# Patient Record
Sex: Female | Born: 1995 | Race: White | Hispanic: No | Marital: Single | Smoking: Never smoker
Health system: Southern US, Community
[De-identification: ages and names within clinical notes are randomized; demographics above are authoritative.]

## PROBLEM LIST (undated history)

## (undated) DIAGNOSIS — K589 Irritable bowel syndrome without diarrhea: Secondary | ICD-10-CM

## (undated) DIAGNOSIS — F329 Major depressive disorder, single episode, unspecified: Secondary | ICD-10-CM

## (undated) DIAGNOSIS — R51 Headache: Secondary | ICD-10-CM

## (undated) DIAGNOSIS — K319 Disease of stomach and duodenum, unspecified: Secondary | ICD-10-CM

## (undated) DIAGNOSIS — R519 Headache, unspecified: Secondary | ICD-10-CM

## (undated) DIAGNOSIS — K2970 Gastritis, unspecified, without bleeding: Secondary | ICD-10-CM

## (undated) DIAGNOSIS — F32A Depression, unspecified: Secondary | ICD-10-CM

## (undated) DIAGNOSIS — E669 Obesity, unspecified: Secondary | ICD-10-CM

## (undated) DIAGNOSIS — J45909 Unspecified asthma, uncomplicated: Secondary | ICD-10-CM

## (undated) DIAGNOSIS — A0472 Enterocolitis due to Clostridium difficile, not specified as recurrent: Secondary | ICD-10-CM

## (undated) DIAGNOSIS — F419 Anxiety disorder, unspecified: Secondary | ICD-10-CM

## (undated) DIAGNOSIS — E559 Vitamin D deficiency, unspecified: Secondary | ICD-10-CM

## (undated) HISTORY — DX: Vitamin D deficiency, unspecified: E55.9

## (undated) HISTORY — DX: Headache, unspecified: R51.9

## (undated) HISTORY — DX: Obesity, unspecified: E66.9

## (undated) HISTORY — DX: Depression, unspecified: F32.A

## (undated) HISTORY — DX: Headache: R51

## (undated) HISTORY — PX: ADENOIDECTOMY: SHX5191

## (undated) HISTORY — DX: Disease of stomach and duodenum, unspecified: K31.9

## (undated) HISTORY — PX: INTRAUTERINE DEVICE INSERTION: SHX323

## (undated) HISTORY — DX: Irritable bowel syndrome, unspecified: K58.9

## (undated) HISTORY — PX: ADENOIDECTOMY: SUR15

## (undated) HISTORY — DX: Major depressive disorder, single episode, unspecified: F32.9

## (undated) HISTORY — DX: Gastritis, unspecified, without bleeding: K29.70

## (undated) HISTORY — DX: Enterocolitis due to Clostridium difficile, not specified as recurrent: A04.72

## (undated) HISTORY — DX: Unspecified asthma, uncomplicated: J45.909

## (undated) HISTORY — DX: Anxiety disorder, unspecified: F41.9

---
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
## 2013-01-22 ENCOUNTER — Other Ambulatory Visit: Payer: Self-pay | Admitting: Internal Medicine

## 2013-01-22 DIAGNOSIS — R1011 Right upper quadrant pain: Secondary | ICD-10-CM

## 2013-01-30 ENCOUNTER — Ambulatory Visit
Admission: RE | Admit: 2013-01-30 | Discharge: 2013-01-30 | Disposition: A | Discharge status: Home / Self Care | Payer: BC Managed Care – PPO | Source: Ambulatory Visit | Attending: Internal Medicine | Admitting: Internal Medicine

## 2013-01-30 DIAGNOSIS — R1011 Right upper quadrant pain: Secondary | ICD-10-CM

## 2013-02-04 ENCOUNTER — Other Ambulatory Visit (HOSPITAL_COMMUNITY): Payer: Self-pay | Admitting: Internal Medicine

## 2013-02-04 DIAGNOSIS — R109 Unspecified abdominal pain: Secondary | ICD-10-CM

## 2013-02-13 ENCOUNTER — Encounter (HOSPITAL_COMMUNITY)
Admission: RE | Admit: 2013-02-13 | Discharge: 2013-02-13 | Disposition: A | Discharge status: Home / Self Care | Payer: BC Managed Care – PPO | Source: Ambulatory Visit | Attending: Internal Medicine | Admitting: Internal Medicine

## 2013-02-13 DIAGNOSIS — R109 Unspecified abdominal pain: Secondary | ICD-10-CM

## 2013-02-13 DIAGNOSIS — R1013 Epigastric pain: Secondary | ICD-10-CM | POA: Insufficient documentation

## 2013-02-13 MED ORDER — TECHNETIUM TC 99M MEBROFENIN IV KIT
5.0000 | PACK | Freq: Once | INTRAVENOUS | Status: AC | PRN
  Administered 2013-02-13: 5 via INTRAVENOUS

## 2013-02-13 MED ORDER — SINCALIDE 5 MCG IJ SOLR
0.0200 ug/kg | Freq: Once | INTRAMUSCULAR | Status: AC
  Administered 2013-02-13: 09:00:00 via INTRAVENOUS

## 2013-07-29 ENCOUNTER — Encounter: Payer: Self-pay | Admitting: Physician Assistant

## 2013-07-29 ENCOUNTER — Ambulatory Visit (INDEPENDENT_AMBULATORY_CARE_PROVIDER_SITE_OTHER): Payer: BC Managed Care – PPO | Admitting: Physician Assistant

## 2013-07-29 VITALS — BP 110/60 | HR 76 | Temp 97.5°F | Resp 16 | Ht 62.5 in | Wt 183.0 lb

## 2013-07-29 DIAGNOSIS — J0100 Acute maxillary sinusitis, unspecified: Secondary | ICD-10-CM

## 2013-07-29 MED ORDER — MAGIC MOUTHWASH W/LIDOCAINE: 5.0000 mL | Freq: Three times a day (TID) | ORAL | Status: DC | PRN

## 2013-07-29 MED ORDER — AZITHROMYCIN 250 MG PO TABS: ORAL_TABLET | ORAL | Status: DC

## 2013-07-29 NOTE — Patient Instructions (Signed)
Pharyngitis °Pharyngitis is redness, pain, and swelling (inflammation) of your pharynx.  °CAUSES  °Pharyngitis is usually caused by infection. Most of the time, these infections are from viruses (viral) and are part of a cold. However, sometimes pharyngitis is caused by bacteria (bacterial). Pharyngitis can also be caused by allergies. Viral pharyngitis may be spread from person to person by coughing, sneezing, and personal items or utensils (cups, forks, spoons, toothbrushes). Bacterial pharyngitis may be spread from person to person by more intimate contact, such as kissing.  °SIGNS AND SYMPTOMS  °Symptoms of pharyngitis include:   °· Sore throat.   °· Tiredness (fatigue).   °· Low-grade fever.   °· Headache. °· Joint pain and muscle aches. °· Skin rashes. °· Swollen lymph nodes. °· Plaque-like film on throat or tonsils (often seen with bacterial pharyngitis). °DIAGNOSIS  °Your health care provider will ask you questions about your illness and your symptoms. Your medical history, along with a physical exam, is often all that is needed to diagnose pharyngitis. Sometimes, a rapid strep test is done. Other lab tests may also be done, depending on the suspected cause.  °TREATMENT  °Viral pharyngitis will usually get better in 3 4 days without the use of medicine. Bacterial pharyngitis is treated with medicines that kill germs (antibiotics).  °HOME CARE INSTRUCTIONS  °· Drink enough water and fluids to keep your urine clear or pale yellow.   °· Only take over-the-counter or prescription medicines as directed by your health care provider:   °· If you are prescribed antibiotics, make sure you finish them even if you start to feel better.   °· Do not take aspirin.   °· Get lots of rest.   °· Gargle with 8 oz of salt water (½ tsp of salt per 1 qt of water) as often as every 1 2 hours to soothe your throat.   °· Throat lozenges (if you are not at risk for choking) or sprays may be used to soothe your throat. °SEEK MEDICAL  CARE IF:  °· You have large, tender lumps in your neck. °· You have a rash. °· You cough up green, yellow-brown, or bloody spit. °SEEK IMMEDIATE MEDICAL CARE IF:  °· Your neck becomes stiff. °· You drool or are unable to swallow liquids. °· You vomit or are unable to keep medicines or liquids down. °· You have severe pain that does not go away with the use of recommended medicines. °· You have trouble breathing (not caused by a stuffy nose). °MAKE SURE YOU:  °· Understand these instructions. °· Will watch your condition. °· Will get help right away if you are not doing well or get worse. °Document Released: 06/06/2005 Document Revised: 03/27/2013 Document Reviewed: 02/11/2013 °ExitCare® Patient Information ©2014 [employer]. ° °

## 2013-07-29 NOTE — Progress Notes (Signed)
   Subjective:    Patient ID: Lori West, female    DOB: 01/17/1996, 18 y.o.   MRN: [id number]  Sore Throat  This is a new problem. Episode onset: 3 days. The problem has been gradually worsening. There has been no fever. Associated symptoms include congestion, coughing, ear pain, a plugged ear sensation and swollen glands. Pertinent negatives include no abdominal pain, diarrhea, drooling, ear discharge, headaches, hoarse voice, neck pain, shortness of breath, stridor, trouble swallowing or vomiting. She has tried acetaminophen (sudafed) for the symptoms. The treatment provided no relief.    Review of Systems  Constitutional: Negative.   HENT: Positive for congestion, ear pain, sinus pressure and sneezing. Negative for drooling, ear discharge, hoarse voice and trouble swallowing.   Respiratory: Positive for cough. Negative for chest tightness, shortness of breath, wheezing and stridor.   Cardiovascular: Negative.   Gastrointestinal: Negative.  Negative for vomiting, abdominal pain and diarrhea.  Genitourinary: Negative.   Musculoskeletal: Negative for neck pain.  Neurological: Negative.  Negative for headaches.       Objective:   Physical Exam  Constitutional: She appears well-developed and well-nourished.  HENT:  Head: Normocephalic and atraumatic.  Right Ear: External ear normal.  Left Ear: External ear normal.  Nose: Right sinus exhibits maxillary sinus tenderness. Left sinus exhibits maxillary sinus tenderness.  Mouth/Throat: Uvula is midline and mucous membranes are normal. Posterior oropharyngeal edema and posterior oropharyngeal erythema present.  Eyes: Conjunctivae and EOM are normal. Pupils are equal, round, and reactive to light.  Neck: Normal range of motion. Neck supple.  Cardiovascular: Normal rate, regular rhythm and normal heart sounds.   Pulmonary/Chest: Effort normal and breath sounds normal.  Abdominal: Soft. Bowel sounds are normal.  Lymphadenopathy:    She  has cervical adenopathy.  Skin: Skin is warm and dry.      Assessment & Plan:  Acute maxillary sinusitis - Plan: azithromycin (ZITHROMAX) 250 MG tablet, Alum & Mag Hydroxide-Simeth (MAGIC MOUTHWASH W/LIDOCAINE) SOLN

## 2013-09-27 ENCOUNTER — Emergency Department (HOSPITAL_COMMUNITY)
Admission: EM | Admit: 2013-09-27 | Discharge: 2013-09-27 | Disposition: A | Discharge status: Home / Self Care | Payer: BC Managed Care – PPO | Attending: Emergency Medicine | Admitting: Emergency Medicine

## 2013-09-27 DIAGNOSIS — F419 Anxiety disorder, unspecified: Secondary | ICD-10-CM

## 2013-09-27 DIAGNOSIS — F411 Generalized anxiety disorder: Secondary | ICD-10-CM | POA: Insufficient documentation

## 2013-09-27 DIAGNOSIS — R209 Unspecified disturbances of skin sensation: Secondary | ICD-10-CM | POA: Insufficient documentation

## 2013-09-27 DIAGNOSIS — R071 Chest pain on breathing: Secondary | ICD-10-CM | POA: Insufficient documentation

## 2013-09-27 NOTE — ED Notes (Signed)
Pt reports having anxiety attack this evenings. Pt has hx of of depression and anxiety. Pt dealing with bullying at school. Pt states and "event" happened tonight (non physical) that triggered anxiety. Pt states she would like to speak with someone to get help. Pt is alert and oriented.

## 2013-09-27 NOTE — Discharge Instructions (Signed)
Please use the resource guide below for followup with a mental health specialist.    Emergency Department Resource Guide 1) Find a Doctor and Pay Out of Pocket Although you won't have to find out who is covered by your insurance plan, it is a good idea to ask around and get recommendations. You will then need to call the office and see if the doctor you have chosen will accept you as a new patient and what types of options they offer for patients who are self-pay. Some doctors offer discounts or will set up payment plans for their patients who do not have insurance, but you will need to ask so you aren't surprised when you get to your appointment.  2) Contact Your Local Health Department Not all health departments have doctors that can see patients for sick visits, but many do, so it is worth a call to see if yours does. If you don't know where your local health department is, you can check in your phone book. The CDC also has a tool to help you locate your state's health department, and many state websites also have listings of all of their local health departments.  3) Find a Walk-in Clinic If your illness is not likely to be very severe or complicated, you may want to try a walk in clinic. These are popping up all over the country in pharmacies, drugstores, and shopping centers. They're usually staffed by nurse practitioners or physician assistants that have been trained to treat common illnesses and complaints. They're usually fairly quick and inexpensive. However, if you have serious medical issues or chronic medical problems, these are probably not your best option.  No Primary Care Doctor: - Call Health Connect at  [phone] - they can help you locate a primary care doctor that  accepts your insurance, provides certain services, etc. - Physician Referral Service- [phone]  Chronic Pain Problems: Organization         Address  Phone   Notes  [facility]  [phone]  [phone] Patients need to be referred by their primary care doctor.   Medication Assistance: Organization         Address  Phone   Notes  [facility] Medication [facility] [address] [place], [place] [address] [phone] --Must be a resident of [facility] [place] -- Must have NO insurance coverage whatsoever (no Medicaid/ Medicare, etc.) -- The pt. MUST have a primary care doctor that directs their care regularly and follows them in the community   MedAssist  [phone]   [employer]  [phone]) [phone]    Agencies that provide inexpensive medical care: Organization         Address  Phone   Notes  [facility]  [phone]) [phone]   [facility]    [phone]   [facility] [address] [place], [place] [address] [phone]   [facility] [address], [place] [phone]   [facility]anned Parenthood    [phone]   [facility]    [phone]) [phone]   [facility]  [address], [place] Phone:  [phone](336) [phone], Fax:  [phone] Hours of Operation:  9 am - 6 pm, M-F.  Also accepts Medicaid/Medicare and self-pay.  [facility]  [address], [place] Phone: [phone], Fax: [phone]) [phone]. Hours of Operation:  8:30  am - 5:30 pm, M-F.  Also accepts Medicaid and self-pay.  [facility] [address], [place] Phone: [phone]   [place], [place], [place] [phone], Ext. [phone] Mondays & Thursdays: 7-9 AM.  First 15 patients are seen on a first come, first serve basis.    [place] Providers:  Organization         Address  Phone   Notes  [facility] [address], [place] [phone] Also accepts self-pay patients.  [facility] [address], [place]   [phone]   [place], [address], [place] [phone]   [facility] [address], [place] [phone]   Lucianne Lei [address], [place]   [phone] Only accepts [place] Access [place] patients after they have their name applied to their card.   Self-Pay (no insurance) in [facility]:  Organization         Address  Phone   Notes  Sickle Cell Patients, [facility] [place] [phone]   [facility] [place] [phone]   [facility] [place]  [place], [place], [place] [phone]   [facility]/Dr. Osei-Bonsu  [address], [place] or [address], [place] [phone] Phone number for both [place] and [place] locations is the same.  Urgent Medical and [facility] [address] [place] [phone]   [facility] [address] [place] or [address] [phone]   [facility] [address] [place] [phone], phone; [phone], fax Sees patients 1st and 3rd Saturday of every month.  Must not qualify for public or private insurance (i.e. Medicaid, Medicare, [place] Health Choice, Veterans' Benefits)  Household income should be no more than 200% of the poverty level The clinic cannot treat you if you are pregnant or think you are pregnant  Sexually transmitted diseases are not treated at the clinic.    Dental Care: Organization         Address  Phone  Notes  [facility] Department of [facility] [place] [phone] Accepts children up to age 64 who are enrolled in [place] or [place]; pregnant women with a Medicaid card; and children who have applied for Medicaid or [place] Health Choice, but  were declined, whose parents can pay a reduced fee at time of service.  [facility] of [facility]  [address], [place] [phone] Accepts children up to age 16 who are enrolled in [place] or [facility]; pregnant women with a Medicaid card; and children who have applied for Medicaid or [place] Health Choice, but were declined, whose parents can pay a reduced fee at time of service.  [place] Adult [facility] Access PROGRAM  [place] [phone] Patients are seen by appointment only. Walk-ins are not accepted. [place] will see patients 24 years of age and older. Monday - Tuesday (8am-5pm) Most Wednesdays (8:30-5pm) $30 per visit, cash only  [facility] Adult Dental Access PROGRAM  [address], [facility] [phone] Patients are seen by appointment only.  Walk-ins are not accepted. [place] will see patients 9 years of age and older. One Wednesday Evening (Monthly: Volunteer Based).  $30 per visit, cash only  [place]  [phone] for adults; Children under age 55, call Graduate Pediatric Dentistry at [phone]. Children aged 30-14, please call [phone] to request a pediatric application.  Dental services are provided in all areas of dental care including fillings, crowns and bridges, complete and partial dentures, implants, gum treatment, root canals, and extractions. Preventive care is also provided. Treatment is provided to both adults and children. Patients are selected via a lottery and there is often a waiting list.   [facility] [address] [place]  [phone] [email or web address]   [facility] [address] [place], [place] [phone], Ext. [phone] Second and Fourth Thursday of each month, opens at 6:30 AM; Clinic ends at 9 AM.  Patients are seen on a first-come first-served basis, and a limited number are seen during each clinic.    [facility]  [address] [place] Danker [place], [place] [phone]   Eligibility Requirements You must have lived in [place], [place], or [place] counties for at least the last three months.   You cannot be eligible for state or federal sponsored [employer], including [employer], [place], or [employer].   You generally cannot be eligible for healthcare insurance through your employer.    How to apply: Eligibility screenings are held every Tuesday and Wednesday afternoon from 1:00 pm until 4:00 pm. You do not need an appointment for the interview!  [facility] [address] [place], [place]   [place]  [place] Department  [place]  [phone]    Behavioral Health Resources in the Community: Intensive Outpatient Programs Organization         Address  Phone  Notes  [place]. [address] [place], [place] [phone]   [facility] Outpatient [address] [place], [place]   ADS: [facility] [address] [place], [place]   [place] [address],  [place], [place] or [phone]   Substance Abuse Resources Organization         Address  Phone  Notes  Alcohol and Drug Services  [phone]   [place]  [phone]   [facility] [place]   Chinita Pester  [phone]   Residential & Outpatient Substance Abuse Program  [phone]   Psychological Services Organization         Address  Phone  Notes  [facility] [place]  [place]  [phone]   [place] [address] [place] or [phone]    Mobile Crisis Teams Organization         Address  Phone  Notes  [facility], Mobile Crisis Care  Unit  [phone]   [facility]  [address] [place], [place]   [place] Levels [address] [place] [phone]    Self-Help/Support Groups Organization         Address  Phone             Notes  [place]. of [place] - variety of support groups  [place] Call for more information  Narcotics  Anonymous (NA), [facility]aring [facility] [address], [employer] [place]  2 meetings at this location   Residential Facilities manager         Address  Phone  Notes  [facility] [place],    [place]  [phone]   [place]  [address] [place] [address], [place], [place]   [place], [place] [phone] Admissions: 8am-3pm M-F  Incentives Substance [place] [address]    [place], [place] [phone]   [facility] [address] [place], [place], [place]   [facility] [address],  [place], [place]   [facility] Programs - Intensive Outpatient [address], [place], [place]   [facility] ([address] [place].,  [place], [place] [phone] or [phone]   [facility] Services (RTS) [address] [place], [place] Accepts Medicaid  [facility] [place] [address],  [place] [phone] Substance Abuse/Addiction Treatment   [facility] Organization         Address  Phone  Notes  [facility]  [phone]   Domenic Schwab, PhD [address] Arlis Port[place], [place]   [phone] or [phone]   Walker [place], [place] [phone]   [facility] [address], [place], [place] [phone] Insurance/Medicaid/sponsorship through [facility] and Families [address]                                     [place], [place] [phone] [place] [address] [place], [place] [phone]    Dr. Adele Schilder  [phone]   [facility] Dept. 1) [address]  2) [place] 3)  [place] [address], [place] [phone]  [phone]   [place] [phone] or [phone] (After Hours)

## 2013-09-27 NOTE — ED Notes (Signed)
Bed: ZO10WA10 Expected date:  Expected time:  Means of arrival:  Comments: EMS/18 yo panic attack-wants to talk with someone

## 2013-09-27 NOTE — ED Provider Notes (Signed)
Medical screening examination/treatment/procedure(s) were performed by non-physician practitioner and as supervising physician I was immediately available for consultation/collaboration.   EKG Interpretation None       Rashawnda Gaba, MD 09/27/13 0349 

## 2013-09-27 NOTE — ED Provider Notes (Signed)
CSN: [id number]     Arrival date & time 09/27/13  0106 History   First MD Initiated Contact with Patient 09/27/13 0128     Chief Complaint  Patient presents with  . Anxiety   HPI  History provided by the patient. 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 Patient states that she has been having stress at school has been dealing with the bullae which caused her increased anxiety and stress. This evening she began feeling a tightness in her chest followed by hyperventilation. She was feeling some tingling in her body. She with her RA in the dorm and they have recommended further evaluation and counseling by coming to the emergency room. Her symptoms first began around 12:30 AM but did resolve on her way to the emergency room. Currently she is feeling much more calm. She reports a similar episode 2 weeks ago which lasted longer between 1 and 2 hours. She currently does not take any medications for her anxiety but does see a counselor which helps slightly. Patient denies any severe depression, SI or a child. Denies any other medical history or complaints.    No past medical history on file. No past surgical history on file. No family history on file. History  Substance Use Topics  . Smoking status: Never Smoker   . Smokeless tobacco: Not on file  . Alcohol Use: Not on file   OB History   Grav Para Term Preterm Abortions TAB SAB Ect Mult Living                 Review of Systems  All other systems reviewed and are negative.     Allergies  Peanut-containing drug products and Alcohol-sulfur  Home Medications   Current Outpatient Rx  Name  Route  Sig  Dispense  Refill  . ibuprofen (ADVIL,MOTRIN) 200 MG tablet   Oral   Take 400 mg by mouth every 6 (six) hours as needed for moderate pain.          BP 140/78  Pulse 109  Temp(Src) 98.2 F (36.8 C) (Oral)  Resp 16  SpO2 99% Physical Exam  Nursing note and vitals  reviewed. Constitutional: She is oriented to person, place, and time. She appears well-developed and well-nourished. No distress.  HENT:  Head: Normocephalic and atraumatic.  Eyes: Conjunctivae and EOM are normal. Pupils are equal, round, and reactive to light.  Cardiovascular: Normal rate and regular rhythm.   No murmur heard. Pulmonary/Chest: Effort normal and breath sounds normal. No respiratory distress. She has no wheezes. She has no rales.  Musculoskeletal: Normal range of motion.  Neurological: She is alert and oriented to person, place, and time.  Skin: Skin is warm and dry. No rash noted.  Psychiatric: She has a normal mood and affect. Her behavior is normal.    ED Course  Procedures   COORDINATION OF CARE:  Nursing notes reviewed. Vital signs reviewed. Initial pt interview and examination performed.   Filed Vitals:   09/27/13 0129  BP: 140/78  Pulse: 109  Temp: 98.2 F (36.8 C)  TempSrc: Oral  Resp: 16  SpO2: 99%    2:23 AM-patient seen and evaluated. She is currently resting comfortably in no acute distress. She reports improvement of her anxiety and panic attack symptoms. Normal respirations. Patient denies SI or HI. She feels safe returning to dorm room.     MDM   Final diagnoses:  Anxiety  Angus SellerPeter S Florina Glas, PA-C 09/27/13 [phone]

## 2013-11-05 ENCOUNTER — Ambulatory Visit (INDEPENDENT_AMBULATORY_CARE_PROVIDER_SITE_OTHER): Payer: BC Managed Care – PPO | Admitting: Physician Assistant

## 2013-11-05 ENCOUNTER — Encounter: Payer: Self-pay | Admitting: Physician Assistant

## 2013-11-05 VITALS — BP 120/78 | HR 88 | Temp 97.5°F | Resp 16 | Wt 185.0 lb

## 2013-11-05 DIAGNOSIS — IMO0002 Reserved for concepts with insufficient information to code with codable children: Secondary | ICD-10-CM

## 2013-11-05 DIAGNOSIS — L02412 Cutaneous abscess of left axilla: Secondary | ICD-10-CM

## 2013-11-05 MED ORDER — DOXYCYCLINE HYCLATE 100 MG PO TABS: 100.0000 mg | ORAL_TABLET | Freq: Two times a day (BID) | ORAL | Status: DC

## 2013-11-05 NOTE — Patient Instructions (Signed)
Abscess An abscess is an infected area that contains a collection of pus and debris.It can occur in almost any part of the body. An abscess is also known as a furuncle or boil. CAUSES  An abscess occurs when tissue gets infected. This can occur from blockage of oil or sweat glands, infection of hair follicles, or a minor injury to the skin. As the body tries to fight the infection, pus collects in the area and creates pressure under the skin. This pressure causes pain. People with weakened immune systems have difficulty fighting infections and get certain abscesses more often.  SYMPTOMS Usually an abscess develops on the skin and becomes a painful mass that is red, warm, and tender. If the abscess forms under the skin, you may feel a moveable soft area under the skin. Some abscesses break open (rupture) on their own, but most will continue to get worse without care. The infection can spread deeper into the body and eventually into the bloodstream, causing you to feel ill.  DIAGNOSIS  Your caregiver will take your medical history and perform a physical exam. A sample of fluid may also be taken from the abscess to determine what is causing your infection. TREATMENT  Your caregiver may prescribe antibiotic medicines to fight the infection. However, taking antibiotics alone usually does not cure an abscess. Your caregiver may need to make a small cut (incision) in the abscess to drain the pus. In some cases, gauze is packed into the abscess to reduce pain and to continue draining the area. HOME CARE INSTRUCTIONS   Only take over-the-counter or prescription medicines for pain, discomfort, or fever as directed by your caregiver.  If you were prescribed antibiotics, take them as directed. Finish them even if you start to feel better.  If gauze is used, follow your caregiver's directions for changing the gauze.  To avoid spreading the infection:  Keep your draining abscess covered with a  bandage.  Wash your hands well.  Do not share personal care items, towels, or whirlpools with others.  Avoid skin contact with others.  Keep your skin and clothes clean around the abscess.  Keep all follow-up appointments as directed by your caregiver. SEEK MEDICAL CARE IF:   You have increased pain, swelling, redness, fluid drainage, or bleeding.  You have muscle aches, chills, or a general ill feeling.  You have a fever. MAKE SURE YOU:   Understand these instructions.  Will watch your condition.  Will get help right away if you are not doing well or get worse. Document Released: 03/16/2005 Document Revised: 12/06/2011 Document Reviewed: 08/19/2011 [facility]ExitCare Patient Information 2014 [place], [place].  Fibrocystic Breast Changes Fibrocystic breast changes happens when tiny sacs filled with fluid form in the breast. They are not cancer. They can feel like lumps. HOME CARE  Check your breasts after every menstrual period or the first day of every month if you do not have menstrual periods. Check for:  Soreness.  New puffiness (swelling).  A change in breast size.  A change in a lump that was already there.  Only take medicine as told by your doctor.  Wear a support or sports bra that fits well.  Avoid caffeine in pop, chocolate, coffee, and tea. GET HELP IF:   You have fluid coming from your nipples, especially if it is bloody.  You have new lumps or bumps in your breast.  Your breast becomes puffy, red, and painful.  You have changes in how your breast looks.  Your  nipples look flat or sunk in. Document Released: 05/19/2008 Document Revised: 02/06/2013 Document Reviewed: 11/25/2012 [facility] Patient Information 2014 [place], [place].

## 2013-11-05 NOTE — Progress Notes (Signed)
   Subjective:    Patient ID: Lori West, female    DOB: 07/22/1995, 18 y.o.   MRN: [id number]  HPI [age] y.o. female presents with lump under left axilla. Noticed yesterday, tender with palpation, no redness. Put warm compresses on it yesterday. Denies fever, chills, weight loss, night sweats, cough, sinus pain, etc   Review of Systems  Constitutional: Negative.   HENT: Negative.   Respiratory: Negative.   Cardiovascular: Negative.   Gastrointestinal: Negative.   Genitourinary: Negative.   Musculoskeletal: Negative.   Skin:       Abnormal bump, redness, pain on left axilla  Neurological: Negative.        Objective:   Physical Exam  Constitutional: She appears well-developed and well-nourished.  HENT:  Head: Normocephalic and atraumatic.  Eyes: Conjunctivae are normal. Pupils are equal, round, and reactive to light.  Neck: Neck supple.  Cardiovascular: Normal rate and regular rhythm.   Pulmonary/Chest: Effort normal and breath sounds normal. Chest wall is not dull to percussion. She exhibits no mass, no tenderness, no bony tenderness, no laceration, no crepitus, no edema, no deformity, no swelling and no retraction. Right breast exhibits tenderness. Right breast exhibits no inverted nipple, no mass, no nipple discharge and no skin change. Left breast exhibits tenderness. Left breast exhibits no inverted nipple, no mass, no nipple discharge and no skin change. Breasts are symmetrical.  Abdominal: Soft. Bowel sounds are normal.  Lymphadenopathy:    She has no cervical adenopathy.  Skin:  Left axilla with deep, tender nodule, mobile, some erythema.       Assessment & Plan:  ? Abscess versus lymph node/fibrocystic breast Breast exam normal Cont warm wet compresses and get on ABX Decrease on salt, caffeine, and sugar If it is not better will get [place]

## 2013-12-19 ENCOUNTER — Ambulatory Visit (INDEPENDENT_AMBULATORY_CARE_PROVIDER_SITE_OTHER): Payer: BC Managed Care – PPO | Admitting: Physician Assistant

## 2013-12-19 ENCOUNTER — Encounter: Payer: Self-pay | Admitting: Physician Assistant

## 2013-12-19 VITALS — BP 120/68 | HR 88 | Temp 97.6°F | Resp 16 | Wt 191.0 lb

## 2013-12-19 DIAGNOSIS — R509 Fever, unspecified: Secondary | ICD-10-CM

## 2013-12-19 DIAGNOSIS — IMO0001 Reserved for inherently not codable concepts without codable children: Secondary | ICD-10-CM

## 2013-12-19 DIAGNOSIS — M7701 Medial epicondylitis, right elbow: Secondary | ICD-10-CM

## 2013-12-19 DIAGNOSIS — M7700 Medial epicondylitis, unspecified elbow: Secondary | ICD-10-CM

## 2013-12-19 DIAGNOSIS — Z79899 Other long term (current) drug therapy: Secondary | ICD-10-CM

## 2013-12-19 MED ORDER — FLUCONAZOLE 150 MG PO TABS: 150.0000 mg | ORAL_TABLET | Freq: Every day | ORAL | Status: DC

## 2013-12-19 MED ORDER — CYCLOBENZAPRINE HCL 10 MG PO TABS: 10.0000 mg | ORAL_TABLET | Freq: Three times a day (TID) | ORAL | Status: DC | PRN

## 2013-12-19 NOTE — Patient Instructions (Signed)
Medial Epicondylitis (Golfer's Elbow) with Rehab Medial epicondylitis involves inflammation and pain around the inner (medial) portion of the elbow. This pain is caused by inflammation of the tendons in the forearm that flex (bring down) the wrist. Medial epicondylitis is also called golfer's elbow, because it is common among golfers. However, it may occur in any individual who flexes the wrist regularly. If medial epicondylitis is left untreated, it may become a chronic problem. SYMPTOMS   Pain, tenderness, or inflammation over the inner (medial) side of the elbow.  Pain or weakness with gripping activities.  Pain that increases with wrist twisting motions (using a screwdriver, playing golf, bowling). CAUSES  Medial epicondylitis is caused by inflammation of the tendons that flex the wrist. Causes of injury may include:  Chronic, repetitive stress and strain to the tendons that run from the wrist and forearm to the elbow.  Sudden strain on the forearm, including wrist snap when serving balls with racquet sports, or throwing a baseball. RISK INCREASES WITH:  Sports or occupations that require repetitive and/or strenuous forearm and wrist movements (pitching a baseball, golfing, carpentry).  Poor wrist and forearm strength and flexibility.  Failure to warm up properly before activity.  Resuming activity before healing, rehabilitation, and conditioning are complete. PREVENTION   Warm up and stretch properly before activity.  Maintain physical fitness:  Strength, flexibility, and endurance.  Cardiovascular fitness.  Wear and use properly fitted equipment.  Learn and use proper technique and have a coach correct improper technique.  Wear a tennis elbow (counterforce) brace. PROGNOSIS  The course of this condition depends on the degree of the injury. If treated properly, acute cases (symptoms lasting less than 4 weeks) are often resolved in 2 to 6 weeks. Chronic (longer lasting  cases) often resolve in 3 to 6 months, but may require physical therapy. RELATED COMPLICATIONS   Frequently recurring symptoms, resulting in a chronic problem. Properly treating the problem the first time decreases frequency of recurrence.  Chronic inflammation, scarring, and partial tendon tear, requiring surgery.  Delayed healing or resolution of symptoms. TREATMENT  Treatment first involves the use of ice and medicine, to reduce pain and inflammation. Strengthening and stretching exercises may reduce discomfort, if performed regularly. These exercises may be performed at home, if the condition is an acute injury. Chronic cases may require a referral to a physical therapist for evaluation and treatment. Your caregiver may advise a corticosteroid injection to help reduce inflammation. Rarely, surgery is needed. MEDICATION  If pain medicine is needed, nonsteroidal anti-inflammatory medicines (aspirin and ibuprofen), or other minor pain relievers (acetaminophen), are often advised.  Do not take pain medicine for 7 days before surgery.  Prescription pain relievers may be given, if your caregiver thinks they are needed. Use only as directed and only as much as you need.  Corticosteroid injections may be recommended. These injections should be reserved only for the most severe cases, because they can only be given a certain number of times. HEAT AND COLD  Cold treatment (icing) should be applied for 10 to 15 minutes every 2 to 3 hours for inflammation and pain, and immediately after activity that aggravates your symptoms. Use ice packs or an ice massage.  Heat treatment may be used before performing stretching and strengthening activities prescribed by your caregiver, physical therapist, or athletic trainer. Use a heat pack or a warm water soak. SEEK MEDICAL CARE IF: Symptoms get worse or do not improve in 2 weeks, despite treatment. EXERCISES  RANGE OF MOTION (  ROM) AND STRETCHING EXERCISES -  Epicondylitis, Medial (Golfer's Elbow) These exercises may help you when beginning to rehabilitate your injury. Your symptoms may go away with or without further involvement from your physician, physical therapist or athletic trainer. While completing these exercises, remember:   Restoring tissue flexibility helps normal motion to return to the joints. This allows healthier, less painful movement and activity.  An effective stretch should be held for at least 30 seconds.  A stretch should never be painful. You should only feel a gentle lengthening or release in the stretched tissue. RANGE OF MOTION - Wrist Flexion, Active-Assisted  Extend your right / left elbow with your fingers pointing down.*  Gently pull the back of your hand towards you, until you feel a gentle stretch on the top of your forearm.  Hold this position for __________ seconds. Repeat __________ times. Complete this exercise __________ times per day.  *If directed by your physician, physical therapist or athletic trainer, complete this stretch with your elbow bent, rather than extended. RANGE OF MOTION - Wrist Extension, Active-Assisted  Extend your right / left elbow and turn your palm upwards.*  Gently pull your palm and fingertips back, so your wrist extends and your fingers point more toward the ground.  You should feel a gentle stretch on the inside of your forearm.  Hold this position for __________ seconds. Repeat __________ times. Complete this exercise __________ times per day. *If directed by your physician, physical therapist or athletic trainer, complete this stretch with your elbow bent, rather than extended. STRETCH - Wrist Extension   Place your right / left fingertips on a tabletop leaving your elbow slightly bent. Your fingers should point backwards.  Gently press your fingers and palm down onto the table, by straightening your elbow. You should feel a stretch on the inside of your forearm.  Hold  this position for __________ seconds. Repeat __________ times. Complete this stretch __________ times per day.  STRENGTHENING EXERCISES - Epicondylitis, Medial (Golfer's Elbow) These exercises may help you when beginning to rehabilitate your injury. They may resolve your symptoms with or without further involvement from your physician, physical therapist or athletic trainer. While completing these exercises, remember:   Muscles can gain both the endurance and the strength needed for everyday activities through controlled exercises.  Complete these exercises as instructed by your physician, physical therapist or athletic trainer. Increase the resistance and repetitions only as guided.  You may experience muscle soreness or fatigue, but the pain or discomfort you are trying to eliminate should never worsen during these exercises. If this pain does get worse, stop and make sure you are following the directions exactly. If the pain is still present after adjustments, discontinue the exercise until you can discuss the trouble with your caregiver. STRENGTH - Wrist Flexors  Sit with your right / left forearm palm-up, and fully supported on a table or countertop. Your elbow should be resting below the height of your shoulder. Allow your wrist to extend over the edge of the surface.  Loosely holding a __________ weight, or a piece of rubber exercise band or tubing, slowly curl your hand up toward your forearm.  Hold this position for __________ seconds. Slowly lower the wrist back to the starting position in a controlled manner. Repeat __________ times. Complete this exercise __________ times per day.  STRENGTH - Wrist Extensors  Sit with your right / left forearm palm-down and fully supported. Your elbow should be resting below the height of your shoulder.   Allow your wrist to extend over the edge of the surface.  Loosely holding a __________ weight, or a piece of rubber exercise band or tubing, slowly  curl your hand up toward your forearm.  Hold this position for __________ seconds. Slowly lower the wrist back to the starting position in a controlled manner. Repeat __________ times. Complete this exercise __________ times per day.  STRENGTH - Ulnar Deviators  Stand with a ____________________ weight in your right / left hand, or sit while holding a rubber exercise band or tubing, with your healthy arm supported on a table or countertop.  Move your wrist so that your pinkie travels toward your forearm and your thumb moves away from your forearm.  Hold this position for __________ seconds and then slowly lower the wrist back to the starting position. Repeat __________ times. Complete this exercise __________ times per day STRENGTH - Grip   Grasp a tennis ball, a dense sponge, or a large, rolled sock in your hand.  Squeeze as hard as you can, without increasing any pain.  Hold this position for __________ seconds. Release your grip slowly. Repeat __________ times. Complete this exercise __________ times per day.  STRENGTH - Forearm Supinators   Sit with your right / left forearm supported on a table, keeping your elbow below shoulder height. Rest your hand over the edge, palm down.  Gently grip a hammer or a soup ladle.  Without moving your elbow, slowly turn your palm and hand upward to a "thumbs-up" position.  Hold this position for __________ seconds. Slowly return to the starting position. Repeat __________ times. Complete this exercise __________ times per day.  STRENGTH - Forearm Pronators  Sit with your right / left forearm supported on a table, keeping your elbow below shoulder height. Rest your hand over the edge, palm up.  Gently grip a hammer or a soup ladle.  Without moving your elbow, slowly turn your palm and hand upward to a "thumbs-up" position.  Hold this position for __________ seconds. Slowly return to the starting position. Repeat __________ times. Complete  this exercise __________ times per day.  Document Released: 06/06/2005 Document Revised: 08/29/2011 Document Reviewed: 09/18/2008 ExitCare Patient Information 2015 [employer]. This information is not intended to replace advice given to you by your health care provider. Make sure you discuss any questions you have with your health care provider.  

## 2013-12-19 NOTE — Progress Notes (Signed)
   Subjective:    Patient ID: Lori West, female    DOB: Aug 27, 1995, 18 y.o.   MRN: [id number]  HPI 18 y.o. female presents with history of myalgias, fever, chills, nausea, vomiting and headache.  She was in [place] last weekend and went to the ER, she states she started to have fever, chills, right arm pain. She has had myalgias for several weeks but states that it is worse in her right arm, starts at her medial elbow and goes up posterior to her axilla. There they did CBC, Strep test, Flu test, CXR, Urine, and AB [place] that was all negative except her CBC was elevated. The physician there told her it could be RMSF and gave her doxy but did not do blood work, she took her first Doxy yesterday. Denies rashes, diarrhea, joint pain, no change pregnant.    Review of Systems  Constitutional: Positive for fever, chills and fatigue.  Respiratory: Negative.   Cardiovascular: Negative.   Gastrointestinal: Positive for nausea (resolved) and vomiting (resolved).  Genitourinary: Negative.   Musculoskeletal: Positive for myalgias and neck pain. Negative for arthralgias, back pain, gait problem, joint swelling and neck stiffness.  Skin: Negative.  Negative for rash.  Neurological: Positive for headaches. Negative for dizziness, tremors, seizures, syncope, facial asymmetry, speech difficulty, weakness, light-headedness and numbness.       Objective:   Physical Exam  Constitutional: She is oriented to person, place, and time. She appears well-developed and well-nourished.  HENT:  Head: Normocephalic and atraumatic.  Right Ear: External ear normal.  Left Ear: External ear normal.  Mouth/Throat: Oropharynx is clear and moist.  + TMJ left more than right  Eyes: Conjunctivae and EOM are normal. Pupils are equal, round, and reactive to light.  Neck: Normal range of motion. Neck supple. No thyromegaly present.  Cardiovascular: Normal rate, regular rhythm and normal heart sounds.  Exam reveals no gallop and no  friction rub.   No murmur heard. Pulmonary/Chest: Effort normal and breath sounds normal. No respiratory distress. She has no wheezes.  Abdominal: Soft. Bowel sounds are normal. She exhibits no distension and no mass. There is no tenderness. There is no rebound and no guarding.  Musculoskeletal: Normal range of motion.  medial epicondyle tenderness, normal ROM, normal sensation, cap refill, radial pulses, strength distal. Without erythema, swelling, warmth.   Lymphadenopathy:    She has no cervical adenopathy.  Neurological: She is alert and oriented to person, place, and time. She displays normal reflexes. No cranial nerve deficit. Coordination normal.  Skin: Skin is warm and dry.  Psychiatric: She has a normal mood and affect.      Assessment & Plan:  Myalgias- ? RMSF, check labs including CBC, CMET, CPK, Aldolase, ESR  Right elbow pain likely medial epicondylitis- Continue Mobic, RICE, and exercise given, can get brace If not better with refer to orthopedics.   TMJ- information given to the patient, no gum/decrease hard foods, warm wet wash clothes, decrease stress, talk with dentist about possible night guard, can do massage, and exercise.

## 2013-12-20 LAB — CBC WITH DIFFERENTIAL/PLATELET
Basophils Absolute: 0.1 10*3/uL (ref 0.0–0.1)
Basophils Relative: 1 % (ref 0–1)
EOS ABS: 0.2 10*3/uL (ref 0.0–0.7)
Eosinophils Relative: 3 % (ref 0–5)
HEMATOCRIT: 39.2 % (ref 36.0–46.0)
HEMOGLOBIN: 13.8 g/dL (ref 12.0–15.0)
LYMPHS ABS: 2.2 10*3/uL (ref 0.7–4.0)
Lymphocytes Relative: 30 % (ref 12–46)
MCH: 31.6 pg (ref 26.0–34.0)
MCHC: 35.2 g/dL (ref 30.0–36.0)
MCV: 89.7 fL (ref 78.0–100.0)
MONO ABS: 1.1 10*3/uL — AB (ref 0.1–1.0)
MONOS PCT: 15 % — AB (ref 3–12)
NEUTROS ABS: 3.7 10*3/uL (ref 1.7–7.7)
NEUTROS PCT: 51 % (ref 43–77)
Platelets: 270 10*3/uL (ref 150–400)
RBC: 4.37 MIL/uL (ref 3.87–5.11)
RDW: 13.5 % (ref 11.5–15.5)
WBC: 7.2 10*3/uL (ref 4.0–10.5)

## 2013-12-20 LAB — COMPREHENSIVE METABOLIC PANEL
ALBUMIN: 4.4 g/dL (ref 3.5–5.2)
ALT: 10 U/L (ref 0–35)
AST: 15 U/L (ref 0–37)
Alkaline Phosphatase: 82 U/L (ref 39–117)
BUN: 10 mg/dL (ref 6–23)
CALCIUM: 9.4 mg/dL (ref 8.4–10.5)
CO2: 22 meq/L (ref 19–32)
Chloride: 109 mEq/L (ref 96–112)
Creat: 0.53 mg/dL (ref 0.50–1.10)
GLUCOSE: 102 mg/dL — AB (ref 70–99)
POTASSIUM: 3.7 meq/L (ref 3.5–5.3)
SODIUM: 139 meq/L (ref 135–145)
TOTAL PROTEIN: 6.5 g/dL (ref 6.0–8.3)
Total Bilirubin: 0.6 mg/dL (ref 0.2–1.1)

## 2013-12-20 LAB — SEDIMENTATION RATE: SED RATE: 27 mm/h — AB (ref 0–22)

## 2013-12-20 LAB — CK: CK TOTAL: 21 U/L (ref 7–177)

## 2013-12-23 LAB — LYME ABY, WSTRN BLT IGG & IGM W/BANDS
B burgdorferi IgG Abs (IB): NEGATIVE
B burgdorferi IgM Abs (IB): NEGATIVE
LYME DISEASE 23 KD IGM: NONREACTIVE
LYME DISEASE 30 KD IGG: NONREACTIVE
LYME DISEASE 39 KD IGG: NONREACTIVE
LYME DISEASE 41 KD IGG: REACTIVE — AB
LYME DISEASE 45 KD IGG: REACTIVE — AB
LYME DISEASE 58 KD IGG: NONREACTIVE
Lyme Disease 18 kD IgG: NONREACTIVE
Lyme Disease 23 kD IgG: NONREACTIVE
Lyme Disease 28 kD IgG: NONREACTIVE
Lyme Disease 39 kD IgM: NONREACTIVE
Lyme Disease 41 kD IgM: NONREACTIVE
Lyme Disease 66 kD IgG: NONREACTIVE
Lyme Disease 93 kD IgG: NONREACTIVE

## 2013-12-23 LAB — LAMOTRIGINE LEVEL: LAMOTRIGINE LVL: 2 ug/mL — AB (ref 4.0–18.0)

## 2013-12-23 LAB — ROCKY MTN SPOTTED FVR ABS PNL(IGG+IGM)
RMSF IgG: 0.1 IV
RMSF IgM: 0.28 IV

## 2014-03-04 ENCOUNTER — Encounter: Payer: Self-pay | Admitting: Emergency Medicine

## 2014-03-04 ENCOUNTER — Ambulatory Visit (INDEPENDENT_AMBULATORY_CARE_PROVIDER_SITE_OTHER): Payer: BC Managed Care – PPO | Admitting: Emergency Medicine

## 2014-03-04 VITALS — BP 124/70 | HR 86 | Temp 99.6°F | Resp 16 | Ht 62.5 in | Wt 193.0 lb

## 2014-03-04 DIAGNOSIS — J0100 Acute maxillary sinusitis, unspecified: Secondary | ICD-10-CM

## 2014-03-04 MED ORDER — AZITHROMYCIN 250 MG PO TABS: ORAL_TABLET | ORAL | Status: DC

## 2014-03-04 MED ORDER — FLUTICASONE PROPIONATE 50 MCG/ACT NA SUSP: 1.0000 | Freq: Every day | NASAL | Status: DC

## 2014-03-04 NOTE — Progress Notes (Signed)
   Subjective:    Patient ID: Lori West, female    DOB: 1995/10/18, 18 y.o.   MRN: [id number]  HPI Comments: 18 yo with increased allergy symptoms x 2 weeks. She has tried OTC Tylenol/ cold without relief. She notes increased symptoms over last 2 days with fever. She notes increased sinus pressure/ pain.   Emesis  Associated symptoms include a fever.  Fever  Associated symptoms include congestion and vomiting.  Sinusitis Associated symptoms include congestion and sinus pressure.     Medication List       This list is accurate as of: 03/04/14  3:11 PM.  Always use your most recent med list.               norethindrone 0.35 MG tablet  Commonly known as:  MICRONOR,CAMILA,ERRIN  Take 1 tablet by mouth daily.       Allergies  Allergen Reactions  . Peanut-Containing Drug Products Other (See Comments)    Tingling and numbness in throat from nuts  . Alcohol-Sulfur [Sulfur]     Review of Systems  Constitutional: Positive for fever.  HENT: Positive for congestion and sinus pressure.   Gastrointestinal: Positive for vomiting.  All other systems reviewed and are negative.  BP 124/70  Pulse 86  Temp(Src) 99.6 F (37.6 C) (Temporal)  Resp 16  Ht 5' 2.5" (1.588 m)  Wt 193 lb (87.544 kg)  BMI 34.72 kg/m2     Objective:   Physical Exam  Nursing note and vitals reviewed. Constitutional: She is oriented to person, place, and time. She appears well-developed and well-nourished.  HENT:  Head: Normocephalic and atraumatic.  Right Ear: External ear normal.  Left Ear: External ear normal.  Nose: Nose normal.  Mouth/Throat: Oropharynx is clear and moist.  Maxillary tenderness   Eyes: Conjunctivae and EOM are normal.  Neck: Normal range of motion.  Cardiovascular: Normal rate, regular rhythm, normal heart sounds and intact distal pulses.   Pulmonary/Chest: Effort normal and breath sounds normal.  Musculoskeletal: Normal range of motion.  Lymphadenopathy:    She has no  cervical adenopathy.  Neurological: She is alert and oriented to person, place, and time.  Skin: Skin is warm and dry.  Psychiatric: She has a normal mood and affect. Judgment normal.          Assessment & Plan:  Acute maxillary sinusitis, recurrence not specified Take medicine AD, increase H2o

## 2014-03-04 NOTE — Patient Instructions (Signed)
Allergic Rhinitis Allergic rhinitis is when the mucous membranes in the nose respond to allergens. Allergens are particles in the air that cause your body to have an allergic reaction. This causes you to release allergic antibodies. Through a chain of events, these eventually cause you to release histamine into the blood stream. Although meant to protect the body, it is this release of histamine that causes your discomfort, such as frequent sneezing, congestion, and an itchy, runny nose.  CAUSES  Seasonal allergic rhinitis (hay fever) is caused by pollen allergens that may come from grasses, trees, and weeds. Year-round allergic rhinitis (perennial allergic rhinitis) is caused by allergens such as house dust mites, pet dander, and mold spores.  SYMPTOMS   Nasal stuffiness (congestion).  Itchy, runny nose with sneezing and tearing of the eyes. DIAGNOSIS  Your health care provider can help you determine the allergen or allergens that trigger your symptoms. If you and your health care provider are unable to determine the allergen, skin or blood testing may be used. TREATMENT  Allergic rhinitis does not have a cure, but it can be controlled by:  Medicines and allergy shots (immunotherapy).  Avoiding the allergen. Hay fever may often be treated with antihistamines in pill or nasal spray forms. Antihistamines block the effects of histamine. There are over-the-counter medicines that may help with nasal congestion and swelling around the eyes. Check with your health care provider before taking or giving this medicine.  If avoiding the allergen or the medicine prescribed do not work, there are many new medicines your health care provider can prescribe. Stronger medicine may be used if initial measures are ineffective. Desensitizing injections can be used if medicine and avoidance does not work. Desensitization is when a patient is given ongoing shots until the body becomes less sensitive to the allergen.  Make sure you follow up with your health care provider if problems continue. HOME CARE INSTRUCTIONS It is not possible to completely avoid allergens, but you can reduce your symptoms by taking steps to limit your exposure to them. It helps to know exactly what you are allergic to so that you can avoid your specific triggers. SEEK MEDICAL CARE IF:   You have a fever.  You develop a cough that does not stop easily (persistent).  You have shortness of breath.  You start wheezing.  Symptoms interfere with normal daily activities. Document Released: 03/01/2001 Document Revised: 06/11/2013 Document Reviewed: 02/11/2013 [employer] Patient Information 2015 [employer]. This information is not intended to replace advice given to you by your health care provider. Make sure you discuss any questions you have with your health care provider.  Sinusitis Sinusitis is redness, soreness, and puffiness (inflammation) of the air pockets in the bones of your face (sinuses). The redness, soreness, and puffiness can cause air and mucus to get trapped in your sinuses. This can allow germs to grow and cause an infection.  HOME CARE   Drink enough fluids to keep your pee (urine) clear or pale yellow.  Use a humidifier in your home.  Run a hot shower to create steam in the bathroom. Sit in the bathroom with the door closed. Breathe in the steam 3-4 times a day.  Put a warm, moist washcloth on your face 3-4 times a day, or as told by your doctor.  Use salt water sprays (saline sprays) to wet the thick fluid in your nose. This can help the sinuses drain.  Only take medicine as told by your doctor. GET HELP RIGHT   AWAY IF:   Your pain gets worse.  You have very bad headaches.  You are sick to your stomach (nauseous).  You throw up (vomit).  You are very sleepy (drowsy) all the time.  Your face is puffy (swollen).  Your vision changes.  You have a stiff neck.  You have trouble breathing. MAKE  SURE YOU:   Understand these instructions.  Will watch your condition.  Will get help right away if you are not doing well or get worse. Document Released: 11/23/2007 Document Revised: 02/29/2012 Document Reviewed: 01/10/2012 Exit[employer] Patient Information 2015 [employer]. This information is not intended to replace advice given to you by your health care provider. Make sure you discuss any questions you have with your health care provider.  

## 2014-04-04 ENCOUNTER — Other Ambulatory Visit: Payer: Self-pay

## 2014-06-03 ENCOUNTER — Ambulatory Visit (INDEPENDENT_AMBULATORY_CARE_PROVIDER_SITE_OTHER): Payer: BC Managed Care – PPO | Admitting: Physician Assistant

## 2014-06-03 ENCOUNTER — Ambulatory Visit (HOSPITAL_COMMUNITY)
Admission: RE | Admit: 2014-06-03 | Discharge: 2014-06-03 | Disposition: A | Discharge status: Home / Self Care | Payer: BC Managed Care – PPO | Source: Ambulatory Visit | Attending: Physician Assistant | Admitting: Physician Assistant

## 2014-06-03 ENCOUNTER — Other Ambulatory Visit: Payer: Self-pay | Admitting: Physician Assistant

## 2014-06-03 ENCOUNTER — Encounter: Payer: Self-pay | Admitting: Physician Assistant

## 2014-06-03 VITALS — BP 122/80 | HR 68 | Temp 98.2°F | Resp 16 | Ht 62.5 in | Wt 194.0 lb

## 2014-06-03 DIAGNOSIS — Z79899 Other long term (current) drug therapy: Secondary | ICD-10-CM

## 2014-06-03 DIAGNOSIS — K5900 Constipation, unspecified: Secondary | ICD-10-CM

## 2014-06-03 DIAGNOSIS — R1084 Generalized abdominal pain: Secondary | ICD-10-CM

## 2014-06-03 DIAGNOSIS — Z7251 High risk heterosexual behavior: Secondary | ICD-10-CM

## 2014-06-03 LAB — CBC WITH DIFFERENTIAL/PLATELET
BASOS ABS: 0.1 10*3/uL (ref 0.0–0.1)
Basophils Relative: 1 % (ref 0–1)
EOS ABS: 0.2 10*3/uL (ref 0.0–0.7)
EOS PCT: 3 % (ref 0–5)
HEMATOCRIT: 40.7 % (ref 36.0–46.0)
HEMOGLOBIN: 14.1 g/dL (ref 12.0–15.0)
LYMPHS ABS: 2.2 10*3/uL (ref 0.7–4.0)
LYMPHS PCT: 38 % (ref 12–46)
MCH: 31.8 pg (ref 26.0–34.0)
MCHC: 34.6 g/dL (ref 30.0–36.0)
MCV: 91.7 fL (ref 78.0–100.0)
MONO ABS: 0.6 10*3/uL (ref 0.1–1.0)
MPV: 9.5 fL (ref 9.4–12.4)
Monocytes Relative: 10 % (ref 3–12)
Neutro Abs: 2.8 10*3/uL (ref 1.7–7.7)
Neutrophils Relative %: 48 % (ref 43–77)
PLATELETS: 317 10*3/uL (ref 150–400)
RBC: 4.44 MIL/uL (ref 3.87–5.11)
RDW: 13.4 % (ref 11.5–15.5)
WBC: 5.9 10*3/uL (ref 4.0–10.5)

## 2014-06-03 LAB — LIPASE: Lipase: 30 U/L (ref 0–75)

## 2014-06-03 LAB — BASIC METABOLIC PANEL WITH GFR
BUN: 10 mg/dL (ref 6–23)
CALCIUM: 9.3 mg/dL (ref 8.4–10.5)
CHLORIDE: 105 meq/L (ref 96–112)
CO2: 26 meq/L (ref 19–32)
CREATININE: 0.66 mg/dL (ref 0.50–1.10)
GFR, Est African American: >89 mL/min
GFR, Est Non African American: >89 mL/min
Glucose, Bld: 94 mg/dL (ref 70–99)
Potassium: 4.1 mEq/L (ref 3.5–5.3)
Sodium: 141 mEq/L (ref 135–145)

## 2014-06-03 LAB — HEPATIC FUNCTION PANEL
ALBUMIN: 4.4 g/dL (ref 3.5–5.2)
ALK PHOS: 76 U/L (ref 39–117)
ALT: 8 U/L (ref 0–35)
AST: 15 U/L (ref 0–37)
BILIRUBIN TOTAL: 0.6 mg/dL (ref 0.2–1.2)
Bilirubin, Direct: 0.1 mg/dL (ref 0.0–0.3)
Indirect Bilirubin: 0.5 mg/dL (ref 0.2–1.2)
TOTAL PROTEIN: 6.7 g/dL (ref 6.0–8.3)

## 2014-06-03 LAB — AMYLASE: AMYLASE: 30 U/L (ref 0–105)

## 2014-06-03 NOTE — Progress Notes (Signed)
Subjective:    Patient ID: Lori West, female    DOB: 05/16/1996, 18 y.o.   MRN: [id number]  Abdominal Cramping This is a new problem. Episode onset: Last Saturday morning. The onset quality is sudden. The problem occurs constantly. The problem has been gradually worsening. The pain is located in the generalized abdominal region. The pain is at a severity of 6/10. The quality of the pain is cramping. Pain radiation: goes through to back  Associated symptoms include constipation and headaches. Pertinent negatives include no diarrhea, dysuria, fever, frequency or vomiting. Associated symptoms comments: Diarrhea and vomiting and taking Ibuprofen for pain.  Took liquid Imodium and threw up shortly after.  Diarrhea and vomiting has resolved. She states she has not had a bowel movement since Saturday.  She has tried stool softeners and magnesium yesterday and still has not had bowel movement.  . Exacerbated by: eating makes it worse. The pain is relieved by nothing. Treatments tried: Patient has tried apples, bananas, applesauce. The treatment provided no relief.  Diarrhea  Episode onset: Last Saturday. Associated symptoms include abdominal pain, chills and headaches. Pertinent negatives include no fever or vomiting. Associated symptoms comments: Had diarrhea and took Imodium for diarrhea.  Patient states the diarrhea has resolved.  .  Emesis  Associated symptoms include abdominal pain, chills and headaches. Pertinent negatives include no diarrhea, dizziness or fever. Associated symptoms comments: Had vomited once and is now resolved..  No surgeries and H/O of gallbladder issues. Creatinine = 0.53 on 12/19/13 Review of Systems  Constitutional: Positive for chills and diaphoresis. Negative for fever and fatigue.  HENT: Negative for congestion, ear discharge, ear pain, postnasal drip, rhinorrhea, sinus pressure, sore throat and trouble swallowing.   Eyes: Negative.   Respiratory: Negative.    Cardiovascular: Negative.   Gastrointestinal: Positive for abdominal pain and constipation. Negative for vomiting, diarrhea, blood in stool and anal bleeding.  Genitourinary: Positive for decreased urine volume and difficulty urinating. Negative for dysuria, urgency, frequency, flank pain, vaginal discharge, vaginal pain and pelvic pain.       She states she is not going as normal for urination.  She states her LMP was 1 week ago due to one missed birth control pill and was spotting.  She has not had her full period since November 2015.  She states she is sexually active and has one partner.  They do not use condoms.  She denies testing for STDs, except for GC/Chlamydia do to it just being a urine test.  She states she was recently tested by OB/GYN, but we have no records here.  Musculoskeletal: Negative.   Skin: Negative.  Negative for rash.  Neurological: Positive for light-headedness and headaches. Negative for dizziness.  Psychiatric/Behavioral: Negative.    No past medical history on file. Current Outpatient Prescriptions on File Prior to Visit  Medication Sig Dispense Refill  . norethindrone (MICRONOR,CAMILA,ERRIN) 0.35 MG tablet Take 1 tablet by mouth daily.     No current facility-administered medications on file prior to visit.   Allergies  Allergen Reactions  . Peanut-Containing Drug Products Other (See Comments)    Tingling and numbness in throat from nuts  . Alcohol-Sulfur [Sulfur]      BP 122/80 mmHg  Pulse 68  Temp(Src) 98.2 F (36.8 C) (Temporal)  Resp 16  Ht 5' 2.5" (1.588 m)  Wt 194 lb (87.998 kg)  BMI 34.90 kg/m2  SpO2 98% Wt Readings from Last 3 Encounters:  06/03/14 194 lb (87.998 kg)  03/04/14 193 lb (  87.544 kg) (97 %*, Z = 1.83)  12/19/13 191 lb (86.637 kg) (96 %*, Z = 1.80)   * Growth percentiles are based on CDC 2-20 Years data.   Objective:   Physical Exam  Constitutional: She is oriented to person, place, and time. She appears well-developed and  well-nourished. She does not have a sickly appearance. No distress.  HENT:  Head: Normocephalic.  Right Ear: Tympanic membrane, external ear and ear canal normal.  Left Ear: Tympanic membrane, external ear and ear canal normal.  Nose: Nose normal. Right sinus exhibits no maxillary sinus tenderness and no frontal sinus tenderness. Left sinus exhibits no maxillary sinus tenderness and no frontal sinus tenderness.  Mouth/Throat: Uvula is midline, oropharynx is clear and moist and mucous membranes are normal. Mucous membranes are not pale and not dry. No trismus in the jaw. No uvula swelling. No oropharyngeal exudate, posterior oropharyngeal edema, posterior oropharyngeal erythema or tonsillar abscesses.  Eyes: Conjunctivae, EOM and lids are normal. Pupils are equal, round, and reactive to light. Right eye exhibits no discharge. Left eye exhibits no discharge. No scleral icterus.  Neck: Trachea normal, normal range of motion and phonation normal. Neck supple. No tracheal tenderness present. No tracheal deviation present.  Cardiovascular: Normal rate, regular rhythm, S1 normal, S2 normal, normal heart sounds and normal pulses.  Exam reveals no gallop, no distant heart sounds and no friction rub.   No murmur heard. Pulmonary/Chest: Effort normal and breath sounds normal. No stridor. No respiratory distress. She has no decreased breath sounds. She has no wheezes. She has no rhonchi. She has no rales. She exhibits no tenderness.  Abdominal: Soft. Normal appearance and bowel sounds are normal. She exhibits no distension, no fluid wave, no ascites, no pulsatile midline mass and no mass. There is no hepatosplenomegaly. There is generalized tenderness. There is positive Murphy's sign. There is no rebound, no guarding and no CVA tenderness. No hernia.  Negative Psoas and Obturator Signs.  Musculoskeletal: Normal range of motion.  Lymphadenopathy:  No tenderness or LAD.  Neurological: She is alert and oriented to  person, place, and time. She has normal strength. No cranial nerve deficit. Gait normal.  Skin: Skin is warm, dry and intact. No rash noted. She is not diaphoretic.  Psychiatric: She has a normal mood and affect. Her speech is normal and behavior is normal. Judgment and thought content normal. Cognition and memory are normal.  Vitals reviewed.  Assessment & Plan:  1. Generalized abdominal pain Ordered labs and imaging to R/O cholecystitis, pancreatitis, constipation, small bowel obstruction and UTI. Gave patient colon clean out instructions for constipation. - US Abdomen Complete; Future - US Pelvis Complete; Future - DG Abd 2 Views; Future - CBC with Differential - BASIC METABOLIC PANEL WITH GFR - Hepatic function panel - Amylase - Lipase - Urine culture - Urinalysis, Routine w reflex microscopic - GC/chlamydia probe amp, urine  2. Encounter for long-term (current) use of medications Will monitor kidney and liver function. - CBC with Differential - BASIC METABOLIC PANEL WITH GFR - Hepatic function panel  3. Unprotected sex Ordered test to R/O infection. - GC/chlamydia probe amp, urine  Abdominal X-Ray on 06/03/14 came back positive with constipation.  Will have patient still get abdominal ultrasound to R/O cholecystitis and pancreatitis.    Discussed medication effects and SE's.  Pt agreed to treatment plan. Please follow up in 1 week.  Lori West, Lise AuerJennifer L, PA-C 10:58 AM [facility]

## 2014-06-03 NOTE — Patient Instructions (Signed)
-One 238-gram container of Miralax/Glycolax-This is available over the counter.  Please check the container carefully.  It is also available in 119-gram size.   -Chocolate Ex-Lax 15mg  squares- also available over the counter. -64 ounces of Gatorade or Pedialyte- any flavor except red or purple.  Make the Miralax and Gatorade solution -Mix 238-grams of Miralax in the entire 64 ounces of Gatorade.  Shake well to mix and store in the refrigerator to chill for an hour or so.  You can also freeze the solution and serve as slushies.  Two squares of chocolate Ex-Lax  Start drinking the Miralax/Gatorade solution, 4-12 ounces every 15-20 minutes over the next 6 hours until 64 ounces have been consumed or there is clear liquid stool.  Begin maintenance miralax.  Following day.   Miralax 1 cap in 8 ounces of water twice a day  X-Lax one square before bed.  Fiber to equal 15 grams a day.  If you are not feeling better in 3-5 days, then please call the office. Will call you with results of labs and imaging. If the x-ray comes back with small bowel obstruction, then go to ED immediately. If you are not having a bowel movement or urinary movement, then go to ED immediately.    Constipation Constipation is when a person has fewer than three bowel movements a week, has difficulty having a bowel movement, or has stools that are dry, hard, or larger than normal. As people grow older, constipation is more common. If you try to fix constipation with medicines that make you have a bowel movement (laxatives), the problem may get worse. Long-term laxative use may cause the muscles of the colon to become weak. A low-fiber diet, not taking in enough fluids, and taking certain medicines may make constipation worse.  CAUSES   Certain medicines, such as antidepressants, pain medicine, iron supplements, antacids, and water pills.   Certain diseases, such as diabetes, irritable bowel syndrome (IBS), thyroid disease,  or depression.   Not drinking enough water.   Not eating enough fiber-rich foods.   Stress or travel.   Lack of physical activity or exercise.   Ignoring the urge to have a bowel movement.   Using laxatives too much.  SIGNS AND SYMPTOMS   Having fewer than three bowel movements a week.   Straining to have a bowel movement.   Having stools that are hard, dry, or larger than normal.   Feeling full or bloated.   Pain in the lower abdomen.   Not feeling relief after having a bowel movement.  DIAGNOSIS  Your health care provider will take a medical history and perform a physical exam. Further testing may be done for severe constipation. Some tests may include:  A barium enema X-ray to examine your rectum, colon, and, sometimes, your small intestine.   A sigmoidoscopy to examine your lower colon.   A colonoscopy to examine your entire colon. TREATMENT  Treatment will depend on the severity of your constipation and what is causing it. Some dietary treatments include drinking more fluids and eating more fiber-rich foods. Lifestyle treatments may include regular exercise. If these diet and lifestyle recommendations do not help, your health care provider may recommend taking over-the-counter laxative medicines to help you have bowel movements. Prescription medicines may be prescribed if over-the-counter medicines do not work.  HOME CARE INSTRUCTIONS   Eat foods that have a lot of fiber, such as fruits, vegetables, whole grains, and beans.  Limit foods high in fat and  processed sugars, such as french fries, hamburgers, cookies, candies, and soda.   A fiber supplement may be added to your diet if you cannot get enough fiber from foods.   Drink enough fluids to keep your urine clear or pale yellow.   Exercise regularly or as directed by your health care provider.   Go to the restroom when you have the urge to go. Do not hold it.   Only take over-the-counter or  prescription medicines as directed by your health care provider. Do not take other medicines for constipation without talking to your health care provider first.  SEEK IMMEDIATE MEDICAL CARE IF:   You have bright red blood in your stool.   Your constipation lasts for more than 4 days or gets worse.   You have abdominal or rectal pain.   You have thin, pencil-like stools.   You have unexplained weight loss. MAKE SURE YOU:   Understand these instructions.  Will watch your condition.  Will get help right away if you are not doing well or get worse. Document Released: 03/04/2004 Document Revised: 06/11/2013 Document Reviewed: 03/18/2013 [facility] Patient Information 2015 [employer] [place]. This information is not intended to replace advice given to you by your health care provider. Make sure you discuss any questions you have with your health care provider.

## 2014-06-04 ENCOUNTER — Ambulatory Visit (HOSPITAL_COMMUNITY)
Admission: RE | Admit: 2014-06-04 | Discharge: 2014-06-04 | Disposition: A | Discharge status: Home / Self Care | Payer: BC Managed Care – PPO | Source: Ambulatory Visit | Attending: Physician Assistant | Admitting: Physician Assistant

## 2014-06-04 DIAGNOSIS — R109 Unspecified abdominal pain: Secondary | ICD-10-CM | POA: Insufficient documentation

## 2014-06-04 DIAGNOSIS — R1084 Generalized abdominal pain: Secondary | ICD-10-CM

## 2014-06-04 LAB — URINALYSIS, ROUTINE W REFLEX MICROSCOPIC
BILIRUBIN URINE: NEGATIVE
GLUCOSE, UA: NEGATIVE mg/dL
Hgb urine dipstick: NEGATIVE
Ketones, ur: NEGATIVE mg/dL
LEUKOCYTES UA: NEGATIVE
Nitrite: NEGATIVE
PH: 6 (ref 5.0–8.0)
Protein, ur: NEGATIVE mg/dL
SPECIFIC GRAVITY, URINE: 1.022 (ref 1.005–1.030)
Urobilinogen, UA: 1 mg/dL (ref 0.0–1.0)

## 2014-06-04 LAB — GC/CHLAMYDIA PROBE AMP, URINE
Chlamydia, Swab/Urine, PCR: NEGATIVE
GC Probe Amp, Urine: NEGATIVE

## 2014-06-05 LAB — URINE CULTURE: Colony Count: 25000

## 2014-06-10 ENCOUNTER — Encounter: Payer: Self-pay | Admitting: Physician Assistant

## 2014-06-10 ENCOUNTER — Ambulatory Visit (INDEPENDENT_AMBULATORY_CARE_PROVIDER_SITE_OTHER): Payer: BC Managed Care – PPO | Admitting: Physician Assistant

## 2014-06-10 VITALS — BP 122/66 | HR 88 | Temp 98.0°F | Resp 16 | Ht 62.5 in | Wt 196.0 lb

## 2014-06-10 DIAGNOSIS — E785 Hyperlipidemia, unspecified: Secondary | ICD-10-CM | POA: Insufficient documentation

## 2014-06-10 DIAGNOSIS — K5900 Constipation, unspecified: Secondary | ICD-10-CM

## 2014-06-10 NOTE — Patient Instructions (Addendum)
-Continue medications as prescribed.  Try taking the Ex-Lax along with the drink. Take Fiber OTC medication to help regulate your bowels. Please note any foods that are causing any abdominal pain. Try the Lactaid pill OTC or Lactaid Milk for dairy sensitivity.  If the abdominal pain continues, then please come in for appt and bring food diary. If you are having more abdominal pain, nausea, vomiting, no bowel movements, constipation, fever, chills, sweats, then go to ED immediately.   Irritable Bowel Syndrome Irritable bowel syndrome (IBS) is caused by a disturbance of normal bowel function and is a common digestive disorder. You may also hear this condition called spastic colon, mucous colitis, and irritable colon. There is no cure for IBS. However, symptoms often gradually improve or disappear with a good diet, stress management, and medicine. This condition usually appears in late adolescence or early adulthood. Women develop it twice as often as men. CAUSES  After food has been digested and absorbed in the small intestine, waste material is moved into the large intestine, or colon. In the colon, water and salts are absorbed from the undigested products coming from the small intestine. The remaining residue, or fecal material, is held for elimination. Under normal circumstances, gentle, rhythmic contractions of the bowel walls push the fecal material along the colon toward the rectum. In IBS, however, these contractions are irregular and poorly coordinated. The fecal material is either retained too long, resulting in constipation, or expelled too soon, producing diarrhea. SIGNS AND SYMPTOMS  The most common symptom of IBS is abdominal pain. It is often in the lower left side of the abdomen, but it may occur anywhere in the abdomen. The pain comes from spasms of the bowel muscles happening too much and from the buildup of gas and fecal material in the colon. This pain:  Can range from sharp  abdominal cramps to a dull, continuous ache.  Often worsens soon after eating.  Is often relieved by having a bowel movement or passing gas. Abdominal pain is usually accompanied by constipation, but it may also produce diarrhea. The diarrhea often occurs right after a meal or upon waking up in the morning. The stools are often soft, watery, and flecked with mucus. Other symptoms of IBS include:  Bloating.  Loss of appetite.  Heartburn.  Backache.  Dull pain in the arms or shoulders.  Nausea.  Burping.  Vomiting.  Gas. IBS may also cause symptoms that are unrelated to the digestive system, such as:  Fatigue.  Headaches.  Anxiety.  Shortness of breath.  Trouble concentrating.  Dizziness. These symptoms tend to come and go. DIAGNOSIS  The symptoms of IBS may seem like symptoms of other, more serious digestive disorders. Your health care provider may want to perform tests to exclude these disorders.  TREATMENT Many medicines are available to help correct bowel function or relieve bowel spasms and abdominal pain. Among the medicines available are:  Laxatives for severe constipation and to help restore normal bowel habits.  Specific antidiarrheal medicines to treat severe or lasting diarrhea.  Antispasmodic agents to relieve intestinal cramps. Your health care provider may also decide to treat you with a mild tranquilizer or sedative during unusually stressful periods in your life. Your health care provider may also prescribe antidepressant medicine. The use of this medicine has been shown to reduce pain and other symptoms of IBS. Remember that if any medicine is prescribed for you, you should take it exactly as directed. Make sure your health care provider knows  how well it worked for you. HOME CARE INSTRUCTIONS   Take all medicines as directed by your health care provider.  Avoid foods that are high in fat or oils, such as heavy cream, butter, frankfurters, sausage,  and other fatty meats.  Avoid foods that make you go to the bathroom, such as fruit, fruit juice, and dairy products.  Cut out carbonated drinks, chewing gum, and "gassy" foods such as beans and cabbage. This may help relieve bloating and burping.  Eat foods with bran, and drink plenty of liquids with the bran foods. This helps relieve constipation.  Keep track of what foods seem to bring on your symptoms.  Avoid emotionally charged situations or circumstances that produce anxiety.  Start or continue exercising.  Get plenty of rest and sleep. Document Released: 06/06/2005 Document Revised: 06/11/2013 Document Reviewed: 01/25/2008 [facility] Patient Information 2015 [place], [place]. This information is not intended to replace advice given to you by your health care provider. Make sure you discuss any questions you have with your health care provider.  Diet and Irritable Bowel Syndrome  No cure has been found for irritable bowel syndrome (IBS). Many options are available to treat the symptoms. Your caregiver will give you the best treatments available for your symptoms. He or she will also encourage you to manage stress and to make changes to your diet. You need to work with your caregiver and Registered Dietician to find the best combination of medicine, diet, counseling, and support to control your symptoms. The following are some diet suggestions. FOODS THAT MAKE IBS WORSE  Fatty foods, such as [place] fries.  Milk products, such as cheese or ice cream.  Chocolate.  Alcohol.  Caffeine (found in coffee and some sodas).  Carbonated drinks, such as soda. If certain foods cause symptoms, you should eat less of them or stop eating them. FOOD JOURNAL   Keep a journal of the foods that seem to cause distress. Write down:  What you are eating during the day and when.  What problems you are having after eating.  When the symptoms occur in relation to your meals.  What foods always  make you feel badly.  Take your notes with you to your caregiver to see if you should stop eating certain foods. FOODS THAT MAKE IBS BETTER Fiber reduces IBS symptoms, especially constipation, because it makes stools soft, bulky, and easier to pass. Fiber is found in bran, bread, cereal, beans, fruit, and vegetables. Examples of foods with fiber include:  Apples.  Peaches.  Pears.  Berries.  Figs.  Broccoli, raw.  Cabbage.  Carrots.  Raw peas.  Kidney beans.  Lima beans.  Whole-grain bread.  Whole-grain cereal. Add foods with fiber to your diet a little at a time. This will let your body get used to them. Too much fiber at once might cause gas and swelling of your abdomen. This can trigger symptoms in a person with IBS. Caregivers usually recommend a diet with enough fiber to produce soft, painless bowel movements. High fiber diets may cause gas and bloating. However, these symptoms often go away within a few weeks, as your body adjusts. In many cases, dietary fiber may lessen IBS symptoms, particularly constipation. However, it may not help pain or diarrhea. High fiber diets keep the colon mildly enlarged (distended) with the added fiber. This may help prevent spasms in the colon. Some forms of fiber also keep water in the stool, thereby preventing hard stools that are difficult to pass.  Besides  telling you to eat more foods with fiber, your caregiver may also tell you to get more fiber by taking a fiber pill or drinking water mixed with a special high fiber powder. An example of this is a natural fiber laxative containing psyllium seed.  TIPS  Large meals can cause cramping and diarrhea in people with IBS. If this happens to you, try eating 4 or 5 small meals a day, or try eating less at each of your usual 3 meals. It may also help if your meals are low in fat and high in carbohydrates. Examples of carbohydrates are pasta, rice, whole-grain breads and cereals, fruits, and  vegetables.  If dairy products cause your symptoms to flare up, you can try eating less of those foods. You might be able to handle yogurt better than other dairy products, because it contains bacteria that helps with digestion. Dairy products are an important source of calcium and other nutrients. If you need to avoid dairy products, be sure to talk with a Registered Dietitian about getting these nutrients through other food sources.  Drink enough water and fluids to keep your urine clear or pale yellow. This is important, especially if you have diarrhea. FOR MORE INFORMATION  International Foundation for Functional Gastrointestinal Disorders: www.iffgd.org  National Digestive Diseases Information Clearinghouse: digestive.StageSync.siniddk.nih.gov Document Released: 08/27/2003 Document Revised: 08/29/2011 Document Reviewed: 09/06/2013 [facility]ExitCare Patient Information 2015 [place], [place]. This information is not intended to replace advice given to you by your health care provider. Make sure you discuss any questions you have with your health care provider.

## 2014-06-10 NOTE — Progress Notes (Signed)
Subjective:    Patient ID: Lori West, female    DOB: 03/05/1996, 18 y.o.   MRN: [id number]  HPI  A Caucasian 18yo female presents to office for follow up for constipation.  Patient had blood work and imaging done (Abdominal X-ray and Ultrasound) and the only thing that came back was constipation.  Patient was last seen on 06/03/14.  Patient states she is doing the "Miralax Solution" recipe that was given to her at her appt on 06/03/14.  Patient states she is not doing the Ex-Lax due to her mother, who is a Charity fundraiserN, telling her about "bad" side effects.  She states she is also taking the fiber supplement like benefiber but is store generic brand.  Patient states she is still having pain and notices it when eating dinner.  Her last bowel movement was yesterday or day before.  She denies straining or difficulty pushing.  She states she cannot drink milk due to issues with dairy and GI upset.  Patient does admit to eating cheese and yogurt.  Patient has not tried Lactaid.   She reports eating 3 meals per day.  Told patient that colon clean out was used by pediatricians to help with patient's who have constipation. B-bananas, applesauce, toast L-half sandwich D- Meat, vegetable, starch   GFR= >89 on 06/03/14 Review of Systems  Constitutional: Negative.  Negative for chills, diaphoresis and fatigue.  HENT: Negative.  Negative for congestion, ear discharge, ear pain, postnasal drip, rhinorrhea, sinus pressure, sore throat and trouble swallowing.   Eyes: Negative.   Respiratory: Negative.   Cardiovascular: Negative.   Gastrointestinal: Positive for abdominal pain and constipation. Negative for nausea, vomiting, diarrhea, blood in stool and anal bleeding.       Patient states she did have little bit of diarrhea the first day taking the Miralax.  Patient has generalized abdominal pain.  She states that eating makes it worse.  Genitourinary: Negative.   Musculoskeletal: Negative.   Skin: Negative.   Negative for rash.  Neurological: Negative.   Psychiatric/Behavioral: Negative.    No past medical history on file.  Patient Active Problem List   Diagnosis Date Noted  . Constipation 06/03/2014   Current Outpatient Prescriptions on File Prior to Visit  Medication Sig Dispense Refill  . norethindrone (MICRONOR,CAMILA,ERRIN) 0.35 MG tablet Take 1 tablet by mouth daily.     No current facility-administered medications on file prior to visit.   Allergies  Allergen Reactions  . Peanut-Containing Drug Products Other (See Comments)    Tingling and numbness in throat from nuts  . Alcohol-Sulfur [Sulfur]      BP 122/66 mmHg  Pulse 88  Temp(Src) 98 F (36.7 C) (Temporal)  Resp 16  Ht 5' 2.5" (1.588 m)  Wt 196 lb (88.905 kg)  BMI 35.26 kg/m2  SpO2 98% Wt Readings from Last 3 Encounters:  06/10/14 196 lb (88.905 kg)  06/03/14 194 lb (87.998 kg)  03/04/14 193 lb (87.544 kg) (97 %*, Z = 1.83)   * Growth percentiles are based on CDC 2-20 Years data.   Objective:   Physical Exam  Constitutional: She is oriented to person, place, and time. She appears well-developed and well-nourished. She does not have a sickly appearance. No distress.  HENT:  Head: Normocephalic.  Eyes: Conjunctivae, EOM and lids are normal. Pupils are equal, round, and reactive to light. Right eye exhibits no discharge. Left eye exhibits no discharge. No scleral icterus.  Neck: Trachea normal, normal range of motion and phonation normal.  Neck supple. No tracheal tenderness present.  Cardiovascular: Normal rate, regular rhythm, S1 normal, S2 normal, normal heart sounds and normal pulses.  Exam reveals no gallop, no distant heart sounds and no friction rub.   No murmur heard. Pulmonary/Chest: Effort normal and breath sounds normal. No stridor. No respiratory distress. She has no decreased breath sounds. She has no wheezes. She has no rhonchi. She has no rales. She exhibits no tenderness.  Abdominal: Soft. Normal  appearance and bowel sounds are normal. She exhibits no distension, no fluid wave, no abdominal bruit, no ascites, no pulsatile midline mass and no mass. There is no hepatosplenomegaly. There is generalized tenderness. There is no rigidity, no rebound, no guarding and no CVA tenderness. No hernia.  Musculoskeletal: Normal range of motion.  Lymphadenopathy:  No tenderness or LAD.  Neurological: She is alert and oriented to person, place, and time. She has normal strength. No cranial nerve deficit. Gait normal.  Skin: Skin is warm, dry and intact. No rash noted. She is not diaphoretic. No pallor.  Psychiatric: She has a normal mood and affect. Her speech is normal and behavior is normal. Judgment and thought content normal. Cognition and memory are normal.  Vitals reviewed.  Assessment & Plan:  1. Constipation, unspecified constipation type Continue Miralax solution and add the Ex-Lax.  Told patient she will experience diarrhea, but she needs to clean out her colon from constipation.  Patient expressed understanding and states she will try taking the Ex-Lax. Told patient to stop Miralax solution and Ex-Lax once she is feeling better and to continue Fiber OTC powder.  Patient might have a possible Irritable Bowel Syndrome.  Told patient to write down what she eats when the cramping abdominal pain occurs.  Tod her to come in for an appointment if the abdominal pain continues.    Told patient to try Lactaid pills and Lactaid Milk for lactose intolerance that could possibly be causing her abdominal pain due to her admitting to eating dairy.  Discussed medication effects and SE's.  Pt agreed to treatment plan. If you are not feeling better in 10-14 days, then please call the office.  If you are having more abdominal pain, nausea, vomiting, no bowel movements, constipation, fever, chills, sweats, then go to ED immediately.  Genevive Printup, Lise AuerJennifer L, PA-C 10:46 AM [facility]

## 2014-08-29 ENCOUNTER — Encounter: Payer: Self-pay | Admitting: Physician Assistant

## 2014-08-29 ENCOUNTER — Ambulatory Visit (INDEPENDENT_AMBULATORY_CARE_PROVIDER_SITE_OTHER): Payer: BLUE CROSS/BLUE SHIELD | Admitting: Physician Assistant

## 2014-08-29 VITALS — BP 118/78 | HR 76 | Temp 97.7°F | Resp 16 | Wt 191.0 lb

## 2014-08-29 DIAGNOSIS — E785 Hyperlipidemia, unspecified: Secondary | ICD-10-CM

## 2014-08-29 DIAGNOSIS — R1084 Generalized abdominal pain: Secondary | ICD-10-CM

## 2014-08-29 DIAGNOSIS — D649 Anemia, unspecified: Secondary | ICD-10-CM

## 2014-08-29 DIAGNOSIS — Z79899 Other long term (current) drug therapy: Secondary | ICD-10-CM

## 2014-08-29 DIAGNOSIS — K5900 Constipation, unspecified: Secondary | ICD-10-CM

## 2014-08-29 DIAGNOSIS — E669 Obesity, unspecified: Secondary | ICD-10-CM

## 2014-08-29 DIAGNOSIS — E559 Vitamin D deficiency, unspecified: Secondary | ICD-10-CM

## 2014-08-29 LAB — CBC WITH DIFFERENTIAL/PLATELET
BASOS ABS: 0.1 10*3/uL (ref 0.0–0.1)
Basophils Relative: 1 % (ref 0–1)
EOS ABS: 0.2 10*3/uL (ref 0.0–0.7)
EOS PCT: 3 % (ref 0–5)
HCT: 40.8 % (ref 36.0–46.0)
Hemoglobin: 13.6 g/dL (ref 12.0–15.0)
Lymphocytes Relative: 32 % (ref 12–46)
Lymphs Abs: 2.6 10*3/uL (ref 0.7–4.0)
MCH: 30.9 pg (ref 26.0–34.0)
MCHC: 33.3 g/dL (ref 30.0–36.0)
MCV: 92.7 fL (ref 78.0–100.0)
MPV: 9.7 fL (ref 8.6–12.4)
Monocytes Absolute: 0.8 10*3/uL (ref 0.1–1.0)
Monocytes Relative: 10 % (ref 3–12)
NEUTROS ABS: 4.4 10*3/uL (ref 1.7–7.7)
Neutrophils Relative %: 54 % (ref 43–77)
PLATELETS: 345 10*3/uL (ref 150–400)
RBC: 4.4 MIL/uL (ref 3.87–5.11)
RDW: 13.1 % (ref 11.5–15.5)
WBC: 8.2 10*3/uL (ref 4.0–10.5)

## 2014-08-29 LAB — HEPATIC FUNCTION PANEL
ALT: <8 U/L (ref 0–35)
AST: 14 U/L (ref 0–37)
Albumin: 4.7 g/dL (ref 3.5–5.2)
Alkaline Phosphatase: 92 U/L (ref 39–117)
BILIRUBIN DIRECT: 0.1 mg/dL (ref 0.0–0.3)
BILIRUBIN INDIRECT: 0.4 mg/dL (ref 0.2–1.2)
BILIRUBIN TOTAL: 0.5 mg/dL (ref 0.2–1.2)
Total Protein: 7.1 g/dL (ref 6.0–8.3)

## 2014-08-29 LAB — BASIC METABOLIC PANEL WITH GFR
BUN: 8 mg/dL (ref 6–23)
CALCIUM: 9.8 mg/dL (ref 8.4–10.5)
CHLORIDE: 106 meq/L (ref 96–112)
CO2: 22 mEq/L (ref 19–32)
Creat: 0.56 mg/dL (ref 0.50–1.10)
GFR, Est African American: >89 mL/min
GFR, Est Non African American: >89 mL/min
Glucose, Bld: 83 mg/dL (ref 70–99)
Potassium: 4.2 mEq/L (ref 3.5–5.3)
Sodium: 140 mEq/L (ref 135–145)

## 2014-08-29 LAB — IRON AND TIBC
%SAT: 17 % — AB (ref 20–55)
Iron: 58 ug/dL (ref 42–145)
TIBC: 347 ug/dL (ref 250–470)
UIBC: 289 ug/dL (ref 125–400)

## 2014-08-29 LAB — FERRITIN: FERRITIN: 50 ng/mL (ref 10–291)

## 2014-08-29 LAB — TSH: TSH: 2.423 u[IU]/mL (ref 0.350–4.500)

## 2014-08-29 LAB — HEMOGLOBIN A1C
HEMOGLOBIN A1C: 4.9 % (ref <5.7)
MEAN PLASMA GLUCOSE: 94 mg/dL (ref <117)

## 2014-08-29 LAB — LIPID PANEL
CHOL/HDL RATIO: 2.8 ratio
CHOLESTEROL: 136 mg/dL (ref 0–200)
HDL: 48 mg/dL (ref >=46)
LDL Cholesterol: 79 mg/dL (ref 0–99)
Triglycerides: 46 mg/dL (ref <150)
VLDL: 9 mg/dL (ref 0–40)

## 2014-08-29 LAB — VITAMIN B12: VITAMIN B 12: 412 pg/mL (ref 211–911)

## 2014-08-29 LAB — MAGNESIUM: MAGNESIUM: 2 mg/dL (ref 1.5–2.5)

## 2014-08-29 NOTE — Patient Instructions (Signed)
Benefiber is good for constipation/diarrhea/irritable bowel syndrome, it helps with weight loss and can help lower your bad cholesterol. Please do 1-2 TBSP in the morning in water, coffee, or tea. It can take up to a month before you can see a difference with your bowel movements. It is cheapest from [employer], [employer], [employer].   Influenza is a virus that does not respond to antibiotics. We can give you medications to treat the symptoms.   Tamiflu is a medication that can be given to prevent the flu if someone in your house has been diagnosed with the flu or is suppose to help decrease the duration of the flu. If taken within 2 days of symptoms it has been proven to only shorten the duration of the flu 30% of the time and only by 30% of the duration. In addition, the side effects of this medication can be just as bad as the flu and include:  -allergic reactions like skin rash, itching or hives, swelling of the face, lips, or tongue -anxiety, confusion, unusual behavior -breathing problems -hallucination, loss of contact with reality -redness, blistering, peeling or loosening of the skin, including inside the mouth -seizures Side effects that usually do not require medical attention (report to your doctor or health care professional if they continue or are bothersome): -cough -diarrhea -dizziness -headache -nausea, vomiting -stomach pain  Before you even begin to attack a weight-loss plan, it pays to remember this: You are not fat. You have fat. Losing weight isn't about blame or shame; it's simply another achievement to accomplish. Dieting is like any other skill-you have to buckle down and work at it. As long as you act in a smart, reasonable way, you'll ultimately get where you want to be. Here are some weight loss pearls for you.  1. It's Not a Diet. It's a Lifestyle Thinking of a diet as something you're on and suffering through only for the short term doesn't work. To shed weight and keep it  off, you need to make permanent changes to the way you eat. It's OK to indulge occasionally, of course, but if you cut calories temporarily and then revert to your old way of eating, you'll gain back the weight quicker than you can say yo-yo. Use it to lose it. Research shows that one of the best predictors of long-term weight loss is how many pounds you drop in the first month. For that reason, nutritionists often suggest being stricter for the first two weeks of your new eating strategy to build momentum. Cut out added sugar and alcohol and avoid unrefined carbs. After that, figure out how you can reincorporate them in a way that's healthy and maintainable.  2. There's a Right Way to Exercise Working out burns calories and fat and boosts your metabolism by building muscle. But those trying to lose weight are notorious for overestimating the number of calories they burn and underestimating the amount they take in. Unfortunately, your system is biologically programmed to hold on to extra pounds and that means when you start exercising, your body senses the deficit and ramps up its hunger signals. If you're not diligent, you'll eat everything you burn and then some. Use it to lose it. Cardio gets all the exercise glory, but strength and interval training are the real heroes. They help you build lean muscle, which in turn increases your metabolism and calorie-burning ability 3. Don't Overreact to Mild Hunger Some people have a hard time losing weight because of hunger anxiety. To  them, being hungry is bad-something to be avoided at all costs-so they carry snacks with them and eat when they don't need to. Others eat because they're stressed out or bored. While you never want to get to the point of being ravenous (that's when bingeing is likely to happen), a hunger pang, a craving, or the fact that it's 3:00 p.m. should not send you racing for the vending machine or obsessing about the energy bar in your purse.  Ideally, you should put off eating until your stomach is growling and it's difficult to concentrate.  Use it to lose it. When you feel the urge to eat, use the HALT method. Ask yourself, Am I really hungry? Or am I angry or anxious, lonely or bored, or tired? If you're still not certain, try the apple test. If you're truly hungry, an apple should seem delicious; if it doesn't, something else is going on. Or you can try drinking water and making yourself busy, if you are still hungry try a healthy snack.  4. Not All Calories Are Created Equal The mechanics of weight loss are pretty simple: Take in fewer calories than you use for energy. But the kind of food you eat makes all the difference. Processed food that's high in saturated fat and refined starch or sugar can cause inflammation that disrupts the hormone signals that tell your brain you're full. The result: You eat a lot more.  Use it to lose it. Clean up your diet. Swap in whole, unprocessed foods, including vegetables, lean protein, and healthy fats that will fill you up and give you the biggest nutritional bang for your calorie buck. In a few weeks, as your brain starts receiving regular hunger and fullness signals once again, you'll notice that you feel less hungry overall and naturally start cutting back on the amount you eat.  5. Protein, Produce, and Plant-Based Fats Are Your Weight-Loss Trinity Here's why eating the three Ps regularly will help you drop pounds. Protein fills you up. You need it to build lean muscle, which keeps your metabolism humming so that you can torch more fat. People in a weight-loss program who ate double the recommended daily allowance for protein (about 110 grams for a 150-pound woman) lost 70 percent of their weight from fat, while people who ate the RDA lost only about 40 percent, one study found. Produce is packed with filling fiber. "It's very difficult to consume too many calories if you're eating a lot of  vegetables. Example: Three cups of broccoli is a lot of food, yet only 93 calories. (Fruit is another story. It can be easy to overeat and can contain a lot of calories from sugar, so be sure to monitor your intake.) Plant-based fats like olive oil and those in avocados and nuts are healthy and extra satiating.  Use it to lose it. Aim to incorporate each of the three Ps into every meal and snack. People who eat protein throughout the day are able to keep weight off, according to a study in the American Journal of Clinical Nutrition. In addition to meat, poultry and seafood, good sources are beans, lentils, eggs, tofu, and yogurt. As for fat, keep portion sizes in check by measuring out salad dressing, oil, and nut butters (shoot for one to two tablespoons). Finally, eat veggies or a little fruit at every meal. People who did that consumed 308 fewer calories but didn't feel any hungrier than when they didn't eat more produce.  7. How You  Eat Is As Important As What You Eat In order for your brain to register that you're full, you need to focus on what you're eating. Sit down whenever you eat, preferably at a table. Turn off the TV or computer, put down your phone, and look at your food. Smell it. Chew slowly, and don't put another bite on your fork until you swallow. When women ate lunch this attentively, they consumed 30 percent less when snacking later than those who listened to an audiobook at lunchtime, according to a study in the [place] Journal of Nutrition. 8. Weighing Yourself Really Works The scale provides the best evidence about whether your efforts are paying off. Seeing the numbers tick up or down or stagnate is motivation to keep going-or to rethink your approach. A 2015 study at [facility] [facility] found that daily weigh-ins helped people lose more weight, keep it off, and maintain that loss, even after two years. Use it to lose it. Step on the scale at the same time every day for the best  results. If your weight shoots up several pounds from one weigh-in to the next, don't freak out. Eating a lot of salt the night before or having your period is the likely culprit. The number should return to normal in a day or two. It's a steady climb that you need to do something about. 9. Too Much Stress and Too Little Sleep Are Your Enemies When you're tired and frazzled, your body cranks up the production of cortisol, the stress hormone that can cause carb cravings. Not getting enough sleep also boosts your levels of ghrelin, a hormone associated with hunger, while suppressing leptin, a hormone that signals fullness and satiety. People on a diet who slept only five and a half hours a night for two weeks lost 55 percent less fat and were hungrier than those who slept eight and a half hours, according to a study in the [place] [employer] Journal. Use it to lose it. Prioritize sleep, aiming for seven hours or more a night, which research shows helps lower stress. And make sure you're getting quality zzz's. If a snoring spouse or a fidgety cat wakes you up frequently throughout the night, you may end up getting the equivalent of just four hours of sleep, according to a study from [facility] [employer]. Keep pets out of the bedroom, and use a white-noise app to drown out snoring. 10. You Will Hit a plateau-And You Can Bust Through It As you slim down, your body releases much less leptin, the fullness hormone.  If you're not strength training, start right now. Building muscle can raise your metabolism to help you overcome a plateau. To keep your body challenged and burning calories, incorporate new moves and more intense intervals into your workouts or add another sweat session to your weekly routine. Alternatively, cut an extra 100 calories or so a day from your diet. Now that you've lost weight, your body simply doesn't need as much fuel.

## 2014-08-29 NOTE — Progress Notes (Signed)
Assessment and Plan:  Hypertension: Continue medication, monitor blood pressure at home. Continue DASH diet.  Reminder to go to the ER if any CP, SOB, nausea, dizziness, severe HA, changes vision/speech, left arm numbness and tingling, and jaw pain. Cholesterol personal and family history: Continue diet and exercise. Check cholesterol.  Vitamin D Def- check level and continue medications.  Obesity with co morbidities- long discussion about weight loss, diet, and exercise- will check labs Constipation- get on benefiber continue water, exercise, check celiac, ESR, TSH, etc.   Continue diet and meds as discussed. Further disposition pending results of labs.  HPI 19 y.o. female  presents for 3 month follow up for chol, obesity, and AB pain. Her blood pressure has been controlled at home, today their BP is BP: 118/78 mmHg  She does workout 5 days a week with intervals. She denies chest pain, shortness of breath, dizziness.  She is not on cholesterol medication and denies myalgias.She states she has a FM HX of hyperlipidemia and would like testing, has had it elevated in the past.   She has been working on diet and exercise but is still  Having trouble losing weight, she is seeing a nutritionalist, Deatra Ina with [facility].  She states that she continues to have intermittent constipation, no diarrhea, no blood/black stool. + AB cramping intermittently.  BMI is Body mass index is 34.36 kg/(m^2)., she is working on diet and exercise. Wt Readings from Last 3 Encounters:  08/29/14 191 lb (86.637 kg)  06/10/14 196 lb (88.905 kg)  06/03/14 194 lb (87.998 kg)  She is on Calcium and Primrose.   Current Medications:  Current Outpatient Prescriptions on File Prior to Visit  Medication Sig Dispense Refill  . norethindrone (MICRONOR,CAMILA,ERRIN) 0.35 MG tablet Take 1 tablet by mouth daily.     No current facility-administered medications on file prior to visit.   Medical History: No past  medical history on file. Allergies:  Allergies  Allergen Reactions  . Peanut-Containing Drug Products Other (See Comments)    Tingling and numbness in throat from nuts  . Alcohol-Sulfur [Sulfur]   . Lactose Intolerance (Gi)      Review of Systems:  Review of Systems  Constitutional: Negative.   HENT: Negative.   Respiratory: Negative.   Cardiovascular: Negative.   Gastrointestinal: Positive for abdominal pain and constipation. Negative for heartburn, nausea, vomiting, diarrhea, blood in stool and melena.  Genitourinary: Negative.   Musculoskeletal: Negative.   Skin: Negative.   Neurological: Negative.   Psychiatric/Behavioral: Negative.     Family history- Review and unchanged Social history- Review and unchanged Physical Exam: BP 118/78 mmHg  Pulse 76  Temp(Src) 97.7 F (36.5 C)  Resp 16  Wt 191 lb (86.637 kg) Wt Readings from Last 3 Encounters:  08/29/14 191 lb (86.637 kg)  06/10/14 196 lb (88.905 kg)  06/03/14 194 lb (87.998 kg)   General Appearance: Well nourished, in no apparent distress. Eyes: PERRLA, EOMs, conjunctiva no swelling or erythema Sinuses: No Frontal/maxillary tenderness ENT/Mouth: Ext aud canals clear, TMs without erythema, bulging. No erythema, swelling, or exudate on post pharynx.  Tonsils not swollen or erythematous. Hearing normal.  Neck: Supple, thyroid normal.  Respiratory: Respiratory effort normal, BS equal bilaterally without rales, rhonchi, wheezing or stridor.  Cardio: RRR with no MRGs. Brisk peripheral pulses without edema.  Abdomen: Soft, + BS, + epigastric and LLQ tenderness, no guarding, rebound, hernias, masses. Lymphatics: Non tender without lymphadenopathy.  Musculoskeletal: Full ROM, 5/5 strength, Normal gait Skin: Warm, dry  without rashes, lesions, ecchymosis.  Neuro: Cranial nerves intact. Normal muscle tone, no cerebellar symptoms. Psych: Awake and oriented X 3, normal affect, Insight and Judgment appropriate.    Vicie Mutters, PA-C 10:25 AM [facility]

## 2014-08-30 LAB — SEDIMENTATION RATE: Sed Rate: 8 mm/hr (ref 0–20)

## 2014-08-30 LAB — INSULIN, RANDOM: Insulin: 5.3 u[IU]/mL (ref 2.0–19.6)

## 2014-08-30 LAB — VITAMIN D 25 HYDROXY (VIT D DEFICIENCY, FRACTURES): VIT D 25 HYDROXY: 19 ng/mL — AB (ref 30–100)

## 2014-09-01 LAB — GLIA (IGA/G) + TTG IGA
Gliadin IgA: 3 Units (ref <20)
Gliadin IgG: 1 Units (ref <20)
TISSUE TRANSGLUTAMINASE AB, IGA: 1 U/mL (ref <4)

## 2014-09-22 ENCOUNTER — Other Ambulatory Visit: Payer: Self-pay | Admitting: Internal Medicine

## 2014-09-22 ENCOUNTER — Ambulatory Visit (HOSPITAL_COMMUNITY)
Admission: RE | Admit: 2014-09-22 | Discharge: 2014-09-22 | Disposition: A | Discharge status: Home / Self Care | Payer: BLUE CROSS/BLUE SHIELD | Source: Ambulatory Visit | Attending: Internal Medicine | Admitting: Internal Medicine

## 2014-09-22 DIAGNOSIS — M79672 Pain in left foot: Secondary | ICD-10-CM | POA: Diagnosis not present

## 2014-09-22 DIAGNOSIS — M7989 Other specified soft tissue disorders: Secondary | ICD-10-CM | POA: Diagnosis not present

## 2014-10-24 ENCOUNTER — Other Ambulatory Visit: Payer: Self-pay | Admitting: Internal Medicine

## 2014-12-01 ENCOUNTER — Telehealth: Payer: Self-pay | Admitting: Physician Assistant

## 2014-12-01 MED ORDER — HYOSCYAMINE SULFATE 0.125 MG SL SUBL: 0.1250 mg | SUBLINGUAL_TABLET | Freq: Four times a day (QID) | SUBLINGUAL | Status: DC | PRN

## 2014-12-01 NOTE — Telephone Encounter (Signed)
Patient also complains of diarrhea. Onset of diarrhea was 2 weeks ago.  Diarrhea has been associated with nausea and vomiting. She also states she has had vaginal bleeding for last 5 weeks, has seen OB/GYN. . Patient denies blood in stool, fever, significant abdominal pain.   Will send in levsin for now but patient advised she needs an appointment in the office. Go to ER/urgent care if severe.

## 2014-12-03 ENCOUNTER — Ambulatory Visit: Payer: Self-pay | Admitting: Physician Assistant

## 2014-12-03 ENCOUNTER — Ambulatory Visit (INDEPENDENT_AMBULATORY_CARE_PROVIDER_SITE_OTHER): Payer: BLUE CROSS/BLUE SHIELD | Admitting: Physician Assistant

## 2014-12-03 ENCOUNTER — Encounter: Payer: Self-pay | Admitting: Physician Assistant

## 2014-12-03 VITALS — BP 128/80 | HR 72 | Temp 97.7°F | Resp 16 | Wt 186.0 lb

## 2014-12-03 DIAGNOSIS — E559 Vitamin D deficiency, unspecified: Secondary | ICD-10-CM

## 2014-12-03 DIAGNOSIS — N926 Irregular menstruation, unspecified: Secondary | ICD-10-CM

## 2014-12-03 DIAGNOSIS — R197 Diarrhea, unspecified: Secondary | ICD-10-CM

## 2014-12-03 DIAGNOSIS — Z79899 Other long term (current) drug therapy: Secondary | ICD-10-CM

## 2014-12-03 NOTE — Progress Notes (Signed)
   Subjective:    Patient ID: Lori West, female    DOB: 1995/11/29, 19 y.o.   MRN: [id number]  HPI 19 y.o. WF with history of chol, obesity, AB pain, anxiety presents for abnormal bleeding and anxiety. She has had 5 periods, 4 full periods lasting 3 days, and 1 light one. End of April, 3 menses in May, and beginning of June, she is on the micronor 0,35mg  tablet, she has seen her OB/GYN and had a negative urine preg, normal exam, normal CBC, iron. She is sexually active. She has been under stress, in summer classes, starting [employer].   She states that she continues to have intermittent constipation, no diarrhea, no blood/black stool. + AB cramping intermittently.Negative celiac panel, neg allergy testing, negative sed rate.   Review of Systems  Constitutional: Negative.  Negative for fever, chills, fatigue and unexpected weight change.  HENT: Negative.   Respiratory: Negative.   Cardiovascular: Negative.   Gastrointestinal: Positive for abdominal pain, diarrhea and constipation. Negative for nausea, vomiting, blood in stool, abdominal distention, anal bleeding and rectal pain.  Genitourinary: Positive for menstrual problem. Negative for dysuria, urgency, frequency, hematuria, flank pain, decreased urine volume, vaginal bleeding, vaginal discharge, enuresis, difficulty urinating, genital sores, vaginal pain, pelvic pain and dyspareunia.  Musculoskeletal: Negative.   Skin: Negative.   Neurological: Negative.   Psychiatric/Behavioral: Negative.        Objective:   Physical Exam  Constitutional: She is oriented to person, place, and time. She appears well-developed and well-nourished.  Neck: Normal range of motion. Neck supple.  Cardiovascular: Normal rate and regular rhythm.   Pulmonary/Chest: Effort normal and breath sounds normal.  Abdominal: Soft. Bowel sounds are normal. She exhibits no mass. There is tenderness (diffuse lower quadrant). There is guarding. There is no  rigidity, no rebound, no CVA tenderness, no tenderness at McBurney's point and negative Murphy's sign. No hernia.  Neurological: She is alert and oriented to person, place, and time.  Skin: Skin is warm and dry. No rash noted.  Vitals reviewed.      Assessment & Plan:  Abnormal periods- ? From stress, declines preg test here, understands risk and will get at home, will get TSH, BMP, Liver, Vitamin D, Magnesium, suggest getting on skyla or talking with OB/GYN about it, and suggested possible SSRI, brintellix but has been on several and declines at this time.  Diarrhea/constipation- ? - had negative celiac, sed rate- thinks it is allergy related- will give  amitza samples and referral to GI.

## 2014-12-03 NOTE — Patient Instructions (Signed)
Levonorgestrel intrauterine device (IUD) What is this medicine? LEVONORGESTREL IUD (LEE voe nor jes trel) is a contraceptive (birth control) device. The device is placed inside the uterus by a healthcare professional. It is used to prevent pregnancy and can also be used to treat heavy bleeding that occurs during your period. Depending on the device, it can be used for 3 to 5 years. This medicine may be used for other purposes; ask your health care provider or pharmacist if you have questions. COMMON BRAND NAME(S): LILETTA, Mirena, Skyla What should I tell my health care provider before I take this medicine? They need to know if you have any of these conditions: -abnormal Pap smear -cancer of the breast, uterus, or cervix -diabetes -endometritis -genital or pelvic infection now or in the past -have more than one sexual partner or your partner has more than one partner -heart disease -history of an ectopic or tubal pregnancy -immune system problems -IUD in place -liver disease or tumor -problems with blood clots or take blood-thinners -use intravenous drugs -uterus of unusual shape -vaginal bleeding that has not been explained -an unusual or allergic reaction to levonorgestrel, other hormones, silicone, or polyethylene, medicines, foods, dyes, or preservatives -pregnant or trying to get pregnant -breast-feeding How should I use this medicine? This device is placed inside the uterus by a health care professional. Talk to your pediatrician regarding the use of this medicine in children. Special care may be needed. Overdosage: If you think you have taken too much of this medicine contact a poison control center or emergency room at once. NOTE: This medicine is only for you. Do not share this medicine with others. What if I miss a dose? This does not apply. What may interact with this medicine? Do not take this medicine with any of the following  medications: -amprenavir -bosentan -fosamprenavir This medicine may also interact with the following medications: -aprepitant -barbiturate medicines for inducing sleep or treating seizures -bexarotene -griseofulvin -medicines to treat seizures like carbamazepine, ethotoin, felbamate, oxcarbazepine, phenytoin, topiramate -modafinil -pioglitazone -rifabutin -rifampin -rifapentine -some medicines to treat HIV infection like atazanavir, indinavir, lopinavir, nelfinavir, tipranavir, ritonavir -St. John's wort -warfarin This list may not describe all possible interactions. Give your health care provider a list of all the medicines, herbs, non-prescription drugs, or dietary supplements you use. Also tell them if you smoke, drink alcohol, or use illegal drugs. Some items may interact with your medicine. What should I watch for while using this medicine? Visit your doctor or health care professional for regular check ups. See your doctor if you or your partner has sexual contact with others, becomes HIV positive, or gets a sexual transmitted disease. This product does not protect you against HIV infection (AIDS) or other sexually transmitted diseases. You can check the placement of the IUD yourself by reaching up to the top of your vagina with clean fingers to feel the threads. Do not pull on the threads. It is a good habit to check placement after each menstrual period. Call your doctor right away if you feel more of the IUD than just the threads or if you cannot feel the threads at all. The IUD may come out by itself. You may become pregnant if the device comes out. If you notice that the IUD has come out use a backup birth control method like condoms and call your health care provider. Using tampons will not change the position of the IUD and are okay to use during your period. What side effects may   I notice from receiving this medicine? Side effects that you should report to your doctor or  health care professional as soon as possible: -allergic reactions like skin rash, itching or hives, swelling of the face, lips, or tongue -fever, flu-like symptoms -genital sores -high blood pressure -no menstrual period for 6 weeks during use -pain, swelling, warmth in the leg -pelvic pain or tenderness -severe or sudden headache -signs of pregnancy -stomach cramping -sudden shortness of breath -trouble with balance, talking, or walking -unusual vaginal bleeding, discharge -yellowing of the eyes or skin Side effects that usually do not require medical attention (report to your doctor or health care professional if they continue or are bothersome): -acne -breast pain -change in sex drive or performance -changes in weight -cramping, dizziness, or faintness while the device is being inserted -headache -irregular menstrual bleeding within first 3 to 6 months of use -nausea This list may not describe all possible side effects. Call your doctor for medical advice about side effects. You may report side effects to FDA at 1-800-FDA-1088. Where should I keep my medicine? This does not apply. NOTE: This sheet is a summary. It may not cover all possible information. If you have questions about this medicine, talk to your doctor, pharmacist, or health care provider.  2015, Elsevier/Gold Standard. (2011-07-07 13:54:04)  

## 2014-12-04 ENCOUNTER — Ambulatory Visit: Payer: Self-pay | Admitting: Physician Assistant

## 2014-12-25 ENCOUNTER — Emergency Department (HOSPITAL_COMMUNITY)
Admission: EM | Admit: 2014-12-25 | Discharge: 2014-12-25 | Disposition: A | Discharge status: Home / Self Care | Payer: BLUE CROSS/BLUE SHIELD | Attending: Emergency Medicine | Admitting: Emergency Medicine

## 2014-12-25 ENCOUNTER — Telehealth: Payer: Self-pay

## 2014-12-25 ENCOUNTER — Other Ambulatory Visit: Payer: Self-pay

## 2014-12-25 ENCOUNTER — Emergency Department (HOSPITAL_COMMUNITY): Payer: BLUE CROSS/BLUE SHIELD

## 2014-12-25 ENCOUNTER — Encounter (HOSPITAL_COMMUNITY): Payer: Self-pay | Admitting: Emergency Medicine

## 2014-12-25 ENCOUNTER — Ambulatory Visit: Payer: Self-pay | Admitting: Internal Medicine

## 2014-12-25 DIAGNOSIS — Z3202 Encounter for pregnancy test, result negative: Secondary | ICD-10-CM | POA: Diagnosis not present

## 2014-12-25 DIAGNOSIS — R079 Chest pain, unspecified: Secondary | ICD-10-CM | POA: Insufficient documentation

## 2014-12-25 DIAGNOSIS — Z79899 Other long term (current) drug therapy: Secondary | ICD-10-CM | POA: Insufficient documentation

## 2014-12-25 DIAGNOSIS — R0789 Other chest pain: Secondary | ICD-10-CM | POA: Diagnosis present

## 2014-12-25 DIAGNOSIS — R0602 Shortness of breath: Secondary | ICD-10-CM | POA: Diagnosis not present

## 2014-12-25 LAB — CBC
HCT: 40.4 % (ref 36.0–46.0)
Hemoglobin: 14.2 g/dL (ref 12.0–15.0)
MCH: 31.8 pg (ref 26.0–34.0)
MCHC: 35.1 g/dL (ref 30.0–36.0)
MCV: 90.4 fL (ref 78.0–100.0)
PLATELETS: 296 10*3/uL (ref 150–400)
RBC: 4.47 MIL/uL (ref 3.87–5.11)
RDW: 13 % (ref 11.5–15.5)
WBC: 8.6 10*3/uL (ref 4.0–10.5)

## 2014-12-25 LAB — BASIC METABOLIC PANEL
Anion gap: 9 (ref 5–15)
BUN: 9 mg/dL (ref 6–20)
CHLORIDE: 108 mmol/L (ref 101–111)
CO2: 24 mmol/L (ref 22–32)
CREATININE: 0.57 mg/dL (ref 0.44–1.00)
Calcium: 9.6 mg/dL (ref 8.9–10.3)
GFR calc non Af Amer: >60 mL/min (ref >60)
GLUCOSE: 104 mg/dL — AB (ref 65–99)
POTASSIUM: 3.9 mmol/L (ref 3.5–5.1)
Sodium: 141 mmol/L (ref 135–145)

## 2014-12-25 LAB — D-DIMER, QUANTITATIVE (NOT AT ARMC): D-Dimer, Quant: <0.27 ug/mL-FEU (ref 0.00–0.48)

## 2014-12-25 LAB — POC URINE PREG, ED: Preg Test, Ur: NEGATIVE

## 2014-12-25 LAB — BRAIN NATRIURETIC PEPTIDE: B Natriuretic Peptide: 38.2 pg/mL (ref 0.0–100.0)

## 2014-12-25 LAB — TROPONIN I: Troponin I: <0.03 ng/mL (ref <0.031)

## 2014-12-25 NOTE — Discharge Instructions (Signed)

## 2014-12-25 NOTE — Telephone Encounter (Signed)
Patient called today upset and crying, states that she was having chest pain and could not breath, advised patient to proceed to ER , she verbalized understanding

## 2014-12-25 NOTE — ED Provider Notes (Signed)
CSN: [id number]     Arrival date & time 12/25/14  1137 History   First MD Initiated Contact with Patient 12/25/14 1145     Chief Complaint  Patient presents with  . Chest Pain  . Shortness of Breath     (Consider location/radiation/quality/duration/timing/severity/associated sxs/prior Treatment) Patient is a 19 y.o. female presenting with chest pain.  Chest Pain Pain location:  Substernal area and L chest Pain quality: pressure   Pain radiates to:  Does not radiate Pain severity:  Moderate Onset quality:  Gradual Duration:  2 days Timing:  Constant Progression:  Unchanged Chronicity:  New Context comment:  Started a new exercise routine  Relieved by:  Rest Exacerbated by: lying flat.  certain movements.  Ineffective treatments:  None tried Associated symptoms: shortness of breath (worse on exertion.)   Associated symptoms: no cough, no dizziness and no fever     History reviewed. No pertinent past medical history. History reviewed. No pertinent past surgical history. History reviewed. No pertinent family history. History  Substance Use Topics  . Smoking status: Never Smoker   . Smokeless tobacco: Not on file  . Alcohol Use: Not on file   OB History    No data available     Review of Systems  Constitutional: Negative for fever.  Respiratory: Positive for shortness of breath (worse on exertion.). Negative for cough.   Cardiovascular: Positive for chest pain.  Neurological: Negative for dizziness.  All other systems reviewed and are negative.     Allergies  Peanut-containing drug products; Alcohol-sulfur; and Lactose intolerance (gi)  Home Medications   Prior to Admission medications   Medication Sig Start Date End Date Taking? Authorizing Provider  MELATONIN PO Take 1 tablet by mouth at bedtime as needed (sleep).   Yes Historical Provider, MD  norethindrone (MICRONOR,CAMILA,ERRIN) 0.35 MG tablet Take 1 tablet by mouth daily.   Yes Historical Provider, MD   Prenatal Vit-Fe Fumarate-FA (PRENATAL MULTIVITAMIN) TABS tablet Take 1 tablet by mouth daily at 12 noon.   Yes Historical Provider, MD  hyoscyamine (LEVSIN SL) 0.125 MG SL tablet Place 1 tablet (0.125 mg total) under the tongue every 6 (six) hours as needed for cramping (nausea, diarrhea). Patient not taking: Reported on 12/25/2014 12/01/14   Quentin Mulling, PA-C   BP 140/92 mmHg  Pulse 99  Temp(Src) 98.6 F (37 C) (Oral)  Resp 14  SpO2 100%  LMP 11/27/2014 (Exact Date) Physical Exam  Constitutional: She is oriented to person, place, and time. She appears well-developed and well-nourished. No distress.  HENT:  Head: Normocephalic and atraumatic.  Mouth/Throat: Oropharynx is clear and moist.  Eyes: Conjunctivae are normal. Pupils are equal, round, and reactive to light. No scleral icterus.  Neck: Neck supple.  Cardiovascular: Normal rate, regular rhythm, normal heart sounds and intact distal pulses.   No murmur heard. Pulmonary/Chest: Effort normal and breath sounds normal. No stridor. No respiratory distress. She has no rales.  Abdominal: Soft. Bowel sounds are normal. She exhibits no distension. There is no tenderness.  Musculoskeletal: Normal range of motion.  Neurological: She is alert and oriented to person, place, and time.  Skin: Skin is warm and dry. No rash noted.  Psychiatric: She has a normal mood and affect. Her behavior is normal.  Nursing note and vitals reviewed.   ED Course  Procedures (including critical care time) Labs Review Labs Reviewed  BASIC METABOLIC PANEL - Abnormal; Notable for the following:    Glucose, Bld 104 (*)    All other  components within normal limits  CBC  BRAIN NATRIURETIC PEPTIDE  D-DIMER, QUANTITATIVE (NOT [facility])  TROPONIN I  I-STAT TROPOININ, ED  POC URINE PREG, ED    Imaging Review Dg Chest 2 View  12/25/2014   CLINICAL DATA:  Chest pain and cough and short of breath  EXAM: CHEST  2 VIEW  COMPARISON:  01/12/2011  FINDINGS: The  heart size and mediastinal contours are within normal limits. Both lungs are clear. The visualized skeletal structures are unremarkable.  IMPRESSION: No active cardiopulmonary disease.   Electronically Signed   By: Marlan Palauharles  Clark M.D.   On: 12/25/2014 11:55  All radiology studies independently viewed by me.      EKG Interpretation   Date/Time:  Thursday December 25 2014 11:43:15 EDT Ventricular Rate:  102 PR Interval:  147 QRS Duration: 82 QT Interval:  358 QTC Calculation: 466 R Axis:   60 Text Interpretation:  Sinus tachycardia Probable left atrial enlargement  Borderline repolarization abnormality Baseline wander in lead(s) V1 V2  Confirmed by [facility]  MD, TREY ([id number]) on 12/25/2014 1:19:12 PM      MDM   Final diagnoses:  Chest pain, unspecified chest pain type    19 yo female with chest pain and shortness of breath.  Likely secondary to her new exercise routine.  However, she also complains of significant shortness of breath, particularly on exertion.  HR is just over 100, so PERC does not apply.  D Dimer ordered.    D Dimer negative.  Remained well appearing.  Stable for dc home.    Blake DivineJohn Christophe Rising, MD 12/25/14 [phone]

## 2014-12-25 NOTE — ED Notes (Signed)
Pt in DG at present time. 

## 2014-12-25 NOTE — ED Notes (Signed)
MD at bedside. 

## 2014-12-25 NOTE — ED Notes (Signed)
Pt c/o chest squeezing/tightness, SOB worse while supine, onset Tuesday after intense workout class. Pt denies any increased skin erythema or darkened urine.

## 2015-01-01 ENCOUNTER — Ambulatory Visit: Payer: Self-pay | Admitting: Internal Medicine

## 2015-01-08 ENCOUNTER — Encounter: Payer: Self-pay | Admitting: Internal Medicine

## 2015-01-08 ENCOUNTER — Ambulatory Visit (INDEPENDENT_AMBULATORY_CARE_PROVIDER_SITE_OTHER): Payer: BLUE CROSS/BLUE SHIELD | Admitting: Internal Medicine

## 2015-01-08 VITALS — BP 116/68 | HR 80 | Temp 97.5°F | Resp 16 | Ht 62.25 in | Wt 187.4 lb

## 2015-01-08 DIAGNOSIS — M940 Chondrocostal junction syndrome [Tietze]: Secondary | ICD-10-CM

## 2015-01-08 DIAGNOSIS — J0101 Acute recurrent maxillary sinusitis: Secondary | ICD-10-CM

## 2015-01-08 DIAGNOSIS — E669 Obesity, unspecified: Secondary | ICD-10-CM | POA: Insufficient documentation

## 2015-01-08 DIAGNOSIS — J309 Allergic rhinitis, unspecified: Secondary | ICD-10-CM

## 2015-01-08 MED ORDER — AZITHROMYCIN 250 MG PO TABS: ORAL_TABLET | ORAL | Status: DC

## 2015-01-08 MED ORDER — MELOXICAM 15 MG PO TABS: ORAL_TABLET | ORAL | Status: DC

## 2015-01-08 MED ORDER — MONTELUKAST SODIUM 10 MG PO TABS: ORAL_TABLET | ORAL | Status: DC

## 2015-01-08 NOTE — Progress Notes (Signed)
   Subjective:    Patient ID: Lori West, female    DOB: 1995/09/25, 19 y.o.   MRN: [id number]  HPI Very nice 19 yo SWF presents with parasternal CP's. Was seen/eval recently in ER with neg CXR, Troponins, D-dimer, CBC and preg test. Describes pain at rest as 3/10 - aching in lt>rt parasternal areas. No dyspnea - diaphoresis  Medication Sig  . norethindrone (MICRONOR,CAMILA,ERRIN) 0.35 MG tablet Take 1 tablet by mouth daily.  Marland Kitchen PRENATAL MULTIVITAMIN Take 1 tablet by mouth daily at 12 noon.  . hyoscyamine (LEVSIN SL) 0.125 MG SL tablet not taking  . MELATONIN PO Take 1 tablet by mouth at bedtime as needed (sleep).   No facility-administered medications prior to visit.   Allergies  Allergen Reactions  . Peanut-Containing Drug Products Other (See Comments)    Tingling and numbness in throat from nuts  . Alcohol-Sulfur [Sulfur]   . Lactose Intolerance (Gi)    Review of Systems  10 point systems review negative except as above.    Objective:   Physical Exam  BP 116/68 mmHg  Pulse 80  Temp 97.5 F  Resp 16  Ht 5' 2.25"  Wt 187 lb 6.4 oz     BMI 34.01  HEENT - Eac's patent. TM's Nl. EOM's full. PERRLA. NasoOroPharynx clear. Neck - supple. Nl Thyroid. Carotids 2+ & No bruits, nodes, JVD Chest - Clear equal BS w/o Rales, rhonchi, wheezes. Moderate left > right CC jt tenderness Cor - Nl HS. RRR w/o sig MGR. PP 1(+). No edema. Abd - No palpable organomegaly, masses or tenderness. BS nl. MS- FROM w/o deformities. Muscle power, tone and bulk Nl. Gait Nl. Neuro - No obvious Cr N abnormalities. Sensory, motor and Cerebellar functions appear Nl w/o focal abnormalities. Psyche - Mental status normal & appropriate.  No delusions, ideations or obvious mood abnormalities.    Assessment & Plan:   1. Costochondritis  - meloxicam (MOBIC) 15 MG tablet; Take 1/2 to 1 tablet daily with food for Pain & Inflammation  Dispense: 90 tablet; Refill: 99  - Recc trial with heating pad.  2.  Allergic rhinitis, unspecified allergic rhinitis type  - montelukast (SINGULAIR) 10 MG tablet; Take 1 tablet daily for allergies  Dispense: 30 tablet; Refill: 99  3. Acute recurrent maxillary sinusitis  - to hold  -  azithromycin (ZITHROMAX) 250 MG tablet; Take 2 tablets (500 mg) on  Day 1,  followed by 1 tablet (250 mg) once daily on Days 2 through 5.  Dispense: 6 each; Refill: 1

## 2015-01-08 NOTE — Patient Instructions (Signed)
Costochondritis Costochondritis, sometimes called Tietze syndrome, is a swelling and irritation (inflammation) of the tissue (cartilage) that connects your ribs with your breastbone (sternum). It causes pain in the chest and rib area. Costochondritis usually goes away on its own over time. It can take up to 6 weeks or longer to get better, especially if you are unable to limit your activities. CAUSES  Some cases of costochondritis have no known cause. Possible causes include:  Injury (trauma).  Exercise or activity such as lifting.  Severe coughing. SIGNS AND SYMPTOMS  Pain and tenderness in the chest and rib area.  Pain that gets worse when coughing or taking deep breaths.  Pain that gets worse with specific movements. DIAGNOSIS  Your health care provider will do a physical exam and ask about your symptoms. Chest X-rays or other tests may be done to rule out other problems. TREATMENT  Costochondritis usually goes away on its own over time. Your health care provider may prescribe medicine to help relieve pain. HOME CARE INSTRUCTIONS   Avoid exhausting physical activity. Try not to strain your ribs during normal activity. This would include any activities using chest, abdominal, and side muscles, especially if heavy weights are used.  Apply ice to the affected area for the first 2 days after the pain begins.  Put ice in a plastic bag.  Place a towel between your skin and the bag.  Leave the ice on for 20 minutes, 2-3 times a day.  Only take over-the-counter or prescription medicines as directed by your health care provider. SEEK MEDICAL CARE IF:  You have redness or swelling at the rib joints. These are signs of infection.  Your pain does not go away despite rest or medicine. SEEK IMMEDIATE MEDICAL CARE IF:   Your pain increases or you are very uncomfortable.  You have shortness of breath or difficulty breathing.  You cough up blood.  You have worse chest pains,  sweating, or vomiting.  You have a fever or persistent symptoms for more than 2-3 days.  You have a fever and your symptoms suddenly get worse. MAKE SURE YOU:   Understand these instructions.  Will watch your condition.  Will get help right away if you are not doing well or get worse. Document Released: 03/16/2005 Document Revised: 03/27/2013 Document Reviewed: 01/08/2013 ExitCare Patient Information 2015 [employer]. This information is not intended to replace advice given to you by your health care provider. Make sure you discuss any questions you have with your health care provider.  

## 2015-01-23 ENCOUNTER — Encounter: Payer: Self-pay | Admitting: *Deleted

## 2015-01-27 ENCOUNTER — Encounter: Payer: Self-pay | Admitting: Internal Medicine

## 2015-01-27 ENCOUNTER — Ambulatory Visit (INDEPENDENT_AMBULATORY_CARE_PROVIDER_SITE_OTHER): Payer: BLUE CROSS/BLUE SHIELD | Admitting: Internal Medicine

## 2015-01-27 VITALS — BP 126/80 | HR 88 | Temp 98.6°F | Resp 18 | Ht 62.25 in | Wt 189.0 lb

## 2015-01-27 DIAGNOSIS — R208 Other disturbances of skin sensation: Secondary | ICD-10-CM

## 2015-01-27 DIAGNOSIS — R200 Anesthesia of skin: Secondary | ICD-10-CM

## 2015-01-27 NOTE — Progress Notes (Signed)
Subjective:    Patient ID: Lori West, female    DOB: 03/10/1996, 19 y.o.   MRN: [id number]  HPI  Patient presents to the office for evaluation of numbness and tingling on the right side of her face for the past 1.5 hours.  She reports that the numbness is spreading to her right arm and her right leg.  She states that she is very uncomfortable.  She reports that the numbness is constant and unchanged since it started.  She reports that her ear on her right side feels like there is water rushing over it.  She reports that she was at the movies when this started.  She feels as though this is related to the headaches that she has been having lately.  She reports she has been getting headaches three times weekly and has been having some visual changes with it.  She reports that she has been on some new medications including xyzal and prednisone.  She did 20 mg of prednisone two days ago and 10 mg of prednisone yesterday and she reported some flushing and she didn't feel good.    She reports that she has had some numbness in her face several times in the last few weeks.  She reports that it usually goes away in a couple hours.  She usually goes to sleep and it is gone when she wakes up.   Of note chart review of the patient reveals multiple ER visits relating to chest pain and panic attacks.    Review of Systems  Constitutional: Negative for fever, chills and fatigue.  HENT: Negative for congestion, hearing loss, rhinorrhea, sore throat and voice change.   Eyes: Negative for photophobia and visual disturbance.  Respiratory: Negative for chest tightness, shortness of breath and wheezing.   Cardiovascular: Negative for palpitations.  Gastrointestinal: Negative for nausea and vomiting.  Neurological: Positive for numbness. Negative for dizziness, speech difficulty, weakness and light-headedness.  Psychiatric/Behavioral: Negative for confusion. The patient is nervous/anxious.        Objective:    Physical Exam  Constitutional: She is oriented to person, place, and time. She appears well-developed and well-nourished. No distress.  HENT:  Head: Normocephalic and atraumatic.  Mouth/Throat: Oropharynx is clear and moist. No oropharyngeal exudate.  Eyes: Conjunctivae and EOM are normal. Pupils are equal, round, and reactive to light. No scleral icterus.  Neck: Normal range of motion. Neck supple. No JVD present. No thyromegaly present.  Cardiovascular: Normal rate, regular rhythm and intact distal pulses.  Exam reveals no gallop and no friction rub.   Murmur heard. Soft murmur heard best along left sternal border.  Pulmonary/Chest: Effort normal and breath sounds normal. No respiratory distress. She has no wheezes. She has no rales. She exhibits no tenderness.  Abdominal: Soft. Bowel sounds are normal. She exhibits no distension and no mass. There is no tenderness. There is no rebound and no guarding.  Musculoskeletal: Normal range of motion.  Lymphadenopathy:    She has no cervical adenopathy.  Neurological: She is alert and oriented to person, place, and time. She has normal strength. She is not disoriented. No cranial nerve deficit or sensory deficit. Gait normal. GCS eye subscore is 4. GCS verbal subscore is 5. GCS motor subscore is 6.  Patient reports sensory deficit to the right side to light touch but is able to identify cotton ball in specific locations with eyes closed in all places tested of the right and left side of the body  Skin:  Skin is warm and dry. She is not diaphoretic.  Psychiatric: Her behavior is normal. Judgment and thought content normal. Her mood appears anxious. Her speech is rapid and/or pressured. Cognition and memory are normal.  Nursing note and vitals reviewed.   Filed Vitals:   01/27/15 1607  BP: 126/80  Pulse: 88  Temp: 98.6 F (37 C)  Resp: 18         Assessment & Plan:    1. Right facial numbness  - Ambulatory referral to  Neurology -physical exam without neurological deficits.  Cranial nerves II-XII fully intact.  Do not feel that patient is actively having TIA or stroke.  DDx includes panic attack, aura for migraines, bells palsy, trigeminal neuralgia.  Doubt aortic dissection given no abdominal or chest pain.   -offered decadron injection to see if this would calm or stop numbness which patient declined.   -have discussed with patient if symptoms get worse or she experiences facial droop, weakness, or any other concerning symptoms that she should go to the ER.  Which she states understanding to.   -stop xyzal

## 2015-01-28 ENCOUNTER — Encounter (HOSPITAL_COMMUNITY): Payer: Self-pay | Admitting: *Deleted

## 2015-01-28 ENCOUNTER — Telehealth: Payer: Self-pay | Admitting: Internal Medicine

## 2015-01-28 ENCOUNTER — Emergency Department (HOSPITAL_COMMUNITY)
Admission: EM | Admit: 2015-01-28 | Discharge: 2015-01-28 | Discharge status: Against Medical Advice | Payer: BLUE CROSS/BLUE SHIELD | Attending: Emergency Medicine | Admitting: Emergency Medicine

## 2015-01-28 DIAGNOSIS — R200 Anesthesia of skin: Secondary | ICD-10-CM | POA: Insufficient documentation

## 2015-01-28 DIAGNOSIS — E669 Obesity, unspecified: Secondary | ICD-10-CM | POA: Insufficient documentation

## 2015-01-28 MED ORDER — KETOROLAC TROMETHAMINE 60 MG/2ML IM SOLN
60.0000 mg | Freq: Once | INTRAMUSCULAR | Status: AC
  Administered 2015-01-28: 60 mg via INTRAMUSCULAR
  Filled 2015-01-28: qty 2

## 2015-01-28 NOTE — ED Notes (Signed)
Pt approached nurse first and stated she wanted to leave. Pt encouraged to stay but still chose to leave.

## 2015-01-28 NOTE — ED Notes (Signed)
Pt states that yesterday she experienced right sided facial numbness which has now travelled to her entire right side of her body and over to her rt side of face. No other neuro symptoms noted.

## 2015-01-28 NOTE — ED Notes (Signed)
Spoke with Dr Hyacinth Meeker regarding pt. Orders received.

## 2015-01-28 NOTE — Telephone Encounter (Signed)
Patient aware.  States symptoms have spread to now her left side of face feeling numb.  Advised her to go to ER.  Patient expressed understanding.

## 2015-01-28 NOTE — Telephone Encounter (Signed)
Patient seen yesterday in office for evaluation of facial numbness.  She calls today because she continues to have facial numbness and wants to know if she can have  CT scan.  She is at the dentist currently.  Will order CT scan.  Patient needs to be made aware that she will have to wait for this to be precertified through insurance before she can have this and can take up to several days to weeks.

## 2015-01-30 ENCOUNTER — Ambulatory Visit
Admission: RE | Admit: 2015-01-30 | Discharge: 2015-01-30 | Disposition: A | Discharge status: Home / Self Care | Payer: BLUE CROSS/BLUE SHIELD | Source: Ambulatory Visit | Attending: Internal Medicine | Admitting: Internal Medicine

## 2015-01-30 DIAGNOSIS — R200 Anesthesia of skin: Secondary | ICD-10-CM

## 2015-02-02 ENCOUNTER — Ambulatory Visit: Payer: BLUE CROSS/BLUE SHIELD | Admitting: Internal Medicine

## 2015-02-02 NOTE — Progress Notes (Signed)
Patient ID: Lori West, female   DOB: 16-Aug-1995, 19 y.o.   MRN: [id number] [name]     S  H  O  W ============ I  N           C  O  L  L  E  G  E            i  n            M  A  R  Y  L  A  N  D

## 2015-02-03 ENCOUNTER — Inpatient Hospital Stay (HOSPITAL_COMMUNITY): Payer: BLUE CROSS/BLUE SHIELD

## 2015-02-03 ENCOUNTER — Inpatient Hospital Stay (HOSPITAL_COMMUNITY)
Admission: AD | Admit: 2015-02-03 | Discharge: 2015-02-03 | Disposition: A | Discharge status: Home / Self Care | Payer: BLUE CROSS/BLUE SHIELD | Source: Ambulatory Visit | Attending: Obstetrics and Gynecology | Admitting: Obstetrics and Gynecology

## 2015-02-03 ENCOUNTER — Encounter (HOSPITAL_COMMUNITY): Payer: Self-pay | Admitting: *Deleted

## 2015-02-03 DIAGNOSIS — Z975 Presence of (intrauterine) contraceptive device: Secondary | ICD-10-CM | POA: Diagnosis not present

## 2015-02-03 DIAGNOSIS — R109 Unspecified abdominal pain: Secondary | ICD-10-CM | POA: Diagnosis not present

## 2015-02-03 DIAGNOSIS — T8332XA Displacement of intrauterine contraceptive device, initial encounter: Secondary | ICD-10-CM

## 2015-02-03 MED ORDER — OXYCODONE-ACETAMINOPHEN 5-325 MG PO TABS
2.0000 | ORAL_TABLET | Freq: Once | ORAL | Status: AC
  Administered 2015-02-03: 2 via ORAL
  Filled 2015-02-03: qty 2

## 2015-02-03 MED ORDER — OXYCODONE-ACETAMINOPHEN 5-325 MG PO TABS: 1.0000 | ORAL_TABLET | ORAL | Status: DC | PRN

## 2015-02-03 MED ORDER — ONDANSETRON 8 MG PO TBDP
8.0000 mg | ORAL_TABLET | Freq: Once | ORAL | Status: AC
  Administered 2015-02-03: 8 mg via ORAL
  Filled 2015-02-03: qty 1

## 2015-02-03 NOTE — Discharge Instructions (Signed)
Intrauterine Device Information An intrauterine device (IUD) is inserted into your uterus to prevent pregnancy. There are two types of IUDs available:   Copper IUD--This type of IUD is wrapped in copper wire and is placed inside the uterus. Copper makes the uterus and fallopian tubes produce a fluid that kills sperm. The copper IUD can stay in place for 10 years.  Hormone IUD--This type of IUD contains the hormone progestin (synthetic progesterone). The hormone thickens the cervical mucus and prevents sperm from entering the uterus. It also thins the uterine lining to prevent implantation of a fertilized egg. The hormone can weaken or kill the sperm that get into the uterus. One type of hormone IUD can stay in place for 5 years, and another type can stay in place for 3 years. Your health care provider will make sure you are a good candidate for a contraceptive IUD. Discuss with your health care provider the possible side effects.  ADVANTAGES OF AN INTRAUTERINE DEVICE  IUDs are highly effective, reversible, long acting, and low maintenance.   There are no estrogen-related side effects.   An IUD can be used when breastfeeding.   IUDs are not associated with weight gain.   The copper IUD works immediately after insertion.   The hormone IUD works right away if inserted within 7 days of your period starting. You will need to use a backup method of birth control for 7 days if the hormone IUD is inserted at any other time in your cycle.  The copper IUD does not interfere with your female hormones.   The hormone IUD can make heavy menstrual periods lighter and decrease cramping.   The hormone IUD can be used for 3 or 5 years.   The copper IUD can be used for 10 years. DISADVANTAGES OF AN INTRAUTERINE DEVICE  The hormone IUD can be associated with irregular bleeding patterns.   The copper IUD can make your menstrual flow heavier and more painful.   You may experience cramping and  vaginal bleeding after insertion.  Document Released: 05/10/2004 Document Revised: 02/06/2013 Document Reviewed: 11/25/2012 Exit[employer] Patient Information 2015 [employer]. This information is not intended to replace advice given to you by your health care provider. Make sure you discuss any questions you have with your health care provider.  

## 2015-02-03 NOTE — MAU Provider Note (Signed)
  History     CSN: [id number]  Arrival date and time: 02/03/15 9629   First Provider Initiated Contact with Patient 02/03/15 1948      No chief complaint on file.  HPI  Lori West 19 y.o. [id number] presents to the MAU after having mirena IUD inserted in the office this afternoon. Pt is having modearte to severe abdominal pain since insertion. She states it hurts to lie down. Denies vaginal bleeding at this time.  Past Medical History  Diagnosis Date  . Obesity   . Anxiety   . Vitamin D deficiency     No past surgical history on file.  No family history on file.  Social History  Substance Use Topics  . Smoking status: Never Smoker   . Smokeless tobacco: None  . Alcohol Use: None    Allergies:  Allergies  Allergen Reactions  . Peanut-Containing Drug Products Other (See Comments)    Tingling and numbness in throat from nuts  . Alcohol-Sulfur [Sulfur]   . Lactose Intolerance (Gi)     Prescriptions prior to admission  Medication Sig Dispense Refill Last Dose  . Cholecalciferol (VITAMIN D PO) Take 5,000 Units by mouth daily.   Past Week at Unknown time  . Mometasone Furo-Formoterol Fum (DULERA IN) Inhale into the lungs 2 (two) times daily.   Past Week at Unknown time  . montelukast (SINGULAIR) 10 MG tablet Take 1 tablet daily for allergies 30 tablet 99 Past Week at Unknown time  . norethindrone (MICRONOR,CAMILA,ERRIN) 0.35 MG tablet Take 1 tablet by mouth daily.   01/28/2015 at Unknown time    Review of Systems  Constitutional: Negative for fever.  Gastrointestinal: Positive for abdominal pain.  Genitourinary:       Vaginal pain  All other systems reviewed and are negative.  Physical Exam   Blood pressure 128/72, pulse 92, temperature 99.1 F (37.3 C), temperature source Oral, resp. rate 16, last menstrual period 02/02/2015, SpO2 98 %.  Physical Exam  Nursing note and vitals reviewed. Constitutional: She is oriented to person, place, and time. She appears  well-developed and well-nourished. No distress.  HENT:  Head: Normocephalic and atraumatic.  Cardiovascular: Normal rate.   Respiratory: Effort normal. No respiratory distress.  GI: Soft. There is tenderness.  Musculoskeletal: Normal range of motion.  Neurological: She is alert and oriented to person, place, and time.  Skin: Skin is warm and dry.  Psychiatric: She has a normal mood and affect. Her behavior is normal. Judgment and thought content normal.    MAU Course  Procedures  MDM Dr Henderson Cloud notiifed. Will give percocet while ultrasound is pending. Results given to Dr Henderson Cloud. Will d/C to home with #10 Percocet  Assessment and Plan  Abdominal Pain after IUD insertion  Percocet 5/325 #10 Discharge to home.  Lori West 02/03/2015, 7:57 PM

## 2015-02-03 NOTE — MAU Note (Signed)
Pt reports she had an IUD placed in the office today, started having pain as soon as it was placed but it is worsening.

## 2015-02-03 NOTE — MAU Note (Signed)
PT  SAYS  SHE  STARTED  HER  CYCLE  YESTERDAY  MORN   AND    THIS  MORN  FOR  CRAMPS - SHE  TOOK  400 MG IBUPROFEN  AT 1030 AM     THEN  SHE WENT  TO APPOINTMENT   AT  230PM-   THEN  SHE   IUD INSERTED  AND  TOOK   MORE  AT 430PM  -       SHE  SAY PAIN  IS  WORSE  NOW  THAN  AT 430PM.    NO MORE  MEDS.    SHE  THINKS  VAG  BLEEDING    IS NL FOR   CYCLE.  Marland Kitchen

## 2015-02-04 ENCOUNTER — Ambulatory Visit (INDEPENDENT_AMBULATORY_CARE_PROVIDER_SITE_OTHER): Payer: BLUE CROSS/BLUE SHIELD | Admitting: Internal Medicine

## 2015-02-04 ENCOUNTER — Encounter: Payer: Self-pay | Admitting: Internal Medicine

## 2015-02-04 VITALS — BP 120/76 | HR 72 | Temp 97.5°F | Resp 16 | Ht 63.0 in | Wt 184.6 lb

## 2015-02-04 DIAGNOSIS — Z0000 Encounter for general adult medical examination without abnormal findings: Secondary | ICD-10-CM | POA: Diagnosis not present

## 2015-02-04 DIAGNOSIS — F419 Anxiety disorder, unspecified: Secondary | ICD-10-CM

## 2015-02-04 DIAGNOSIS — J4520 Mild intermittent asthma, uncomplicated: Secondary | ICD-10-CM | POA: Diagnosis not present

## 2015-02-04 DIAGNOSIS — J45909 Unspecified asthma, uncomplicated: Secondary | ICD-10-CM | POA: Insufficient documentation

## 2015-02-04 NOTE — Progress Notes (Signed)
Subjective:    Patient ID: KARLEEN SEEBECK, female    DOB: 1996/01/29, 19 y.o.   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 She relates very intently that she "wants to get out of [place]". She relates she is very nervous and has been so since she "popped out" and has has seen psychiatrists all of her life and and been on all types of medications including Lexapro, Zoloft, Xanax, Buspar,  Lamictal, for anxiety and has even been "misdiagnosed " with ADD and given Intuniv and she has has intolerant to all medicines that she has ever been given. Also yesterday she relates that she had a difficult IUD insertion at the GYN office and ended up at [facility] hospital and had X-rays and was told the IUD placement was OK. She was given #2 tabs of percocet and apparently had a dysphoric reaction and gave me her Percocet Rx to shred. She states she was dx'd with PCOS by Dr Chevis Pretty.   Medication Sig  . albuterol  HFA  inhaler Inhale 1-2 puffs into the lungs every 6 (six) hours as needed for wheezing or shortness of breath.  Marland Kitchen VITAMIN D Take 5,000 Units by mouth daily.  Marland Kitchen ibuprofen  200 MG tablet Take 400 mg by mouth every 6 (six) hours as needed for headache or moderate pain.  Elwin Sleight  Inhale 1-2 sprays into the lungs 2 (two) times daily.   . montelukast  10 MG tablet Take 1 tablet daily for allergies  .  PRENATAL MULTIVITAMIN)  Take 1 tablet by mouth daily at 12 noon.  . norethindrone (MICRONOR) 0.35 MG tablet Take 1 tablet by mouth daily.   Allergies  Allergen Reactions  . Peanut-Containing Drug Products Other (See Comments)    Tingling and numbness in throat from tree nuts  . Alcohol-Sulfur [Sulfur]   . Lactose Intolerance (Gi)    Past Medical History  Diagnosis Date  . Obesity   . Anxiety   . Vitamin D deficiency    Review of Systems  10 point systems review negative except as above.    Objective:   Physical  Exam  BP 120/76   Pulse 72  Temp 97.5 F   Resp 16  Ht    Wt 184 lb 9.6 oz   BMI 32.71   LMP 02/02/2015  Morbidly obese and in no acute distress  HEENT - Eac's patent. TM's Nl. EOM's full. PERRLA. NasoOroPharynx clear. Neck - supple. Nl Thyroid. Carotids 2+ & No bruits, nodes, JVD Chest - Clear equal BS w/o Rales, rhonchi, wheezes. Cor - Nl HS. RRR w/o sig MGR. PP 1(+). No edema. Abd - Obese, soft , non tender w/o guarding. BS nl. MS- FROM w/o deformities. Muscle power, tone and bulk Nl. Gait Nl. Neuro - No obvious Cr N abnormalities. Sensory, motor and Cerebellar functions appear Nl w/o focal abnormalities. Psyche - Mental status with patient very anxious with pressured speech and flight of ideas.   No psychosis or hallucinations, but almost seems delusional with impaired reality testing. .  Immunization records reviewed with patient    Assessment & Plan:   1. Routine general medical examination at a health care facility   2. Intrinsic asthma, mild intermittent, uncomplicated   3. Chronic anxiety  Over 40 minutes of exam, counseling, chart review and high complex critical decision making was performed

## 2015-02-06 ENCOUNTER — Ambulatory Visit (INDEPENDENT_AMBULATORY_CARE_PROVIDER_SITE_OTHER): Payer: BLUE CROSS/BLUE SHIELD | Admitting: Neurology

## 2015-02-06 ENCOUNTER — Encounter: Payer: Self-pay | Admitting: Neurology

## 2015-02-06 VITALS — BP 134/80 | HR 88 | Ht 63.0 in | Wt 183.8 lb

## 2015-02-06 DIAGNOSIS — G43109 Migraine with aura, not intractable, without status migrainosus: Secondary | ICD-10-CM

## 2015-02-06 DIAGNOSIS — H905 Unspecified sensorineural hearing loss: Secondary | ICD-10-CM

## 2015-02-06 DIAGNOSIS — G43509 Persistent migraine aura without cerebral infarction, not intractable, without status migrainosus: Secondary | ICD-10-CM

## 2015-02-06 DIAGNOSIS — R51 Headache with orthostatic component, not elsewhere classified: Secondary | ICD-10-CM

## 2015-02-06 DIAGNOSIS — R519 Headache, unspecified: Secondary | ICD-10-CM

## 2015-02-06 DIAGNOSIS — H9190 Unspecified hearing loss, unspecified ear: Secondary | ICD-10-CM

## 2015-02-06 MED ORDER — TOPIRAMATE ER 25 MG PO CAP24: 25.0000 mg | ORAL_CAPSULE | Freq: Every day | ORAL | Status: DC

## 2015-02-06 NOTE — Progress Notes (Signed)
[facility]    Provider:  Dr Lucia Gaskins Referring Provider: Lucky Cowboy, MD Primary Care Physician:  Nadean Corwin, MD  CC:  Right-sided numbness and tingling with headache  HPI:  Lori West is a 19 y.o. female here as a referral from Dr. Oneta Rack for numbness and paresthesias.  A week ago, she started having right sided face, arm numbness. Not on the thorax/abd/pelvis. Spread to the leg and the body. They went to the emergency room. On the 12th went to the ED because it had spread to the left side of the body. Numbness and tingling. It has been off and on, suddenly, yesterday it was the left side of the face but she is having episodes that acutely start and stop sometimes lasting a few hours. Triggers are chewing. She has had headaches in the past, migraines. She gets headaches. They start in the back of the head, changes in hearing, pressure more on the right side of the head, feels like water is coming down on the right side of the face, photophobia, she cuts off the lights and ibuprofen which doesn't help. She has the headaches weekly. They last all day, needs to sleep. 8/10 pain.   Reviewed notes, labs and imaging from outside physicians, which showed:   CT 01/30/2015 showed No acute intracranial abnormalities including mass lesion or mass effect, hydrocephalus, extra-axial fluid collection, midline shift, hemorrhage, or acute infarction, large ischemic events (personally reviewed images)  CBC and BMP unremarkable  Review of Systems: Patient complains of symptoms per HPI as well as the following symptoms: Weight gain, shortness of breath, feeling hot, diarrhea, constipation, allergies, headache, numbness, dizziness, passing out, anxiety. Pertinent negatives per HPI. All others negative.   Social History   Social History  . Marital Status: Single    Spouse Name: N/A  . Number of Children: N/A  . Years of Education: N/A   Occupational History  .  Student     Social History Main Topics  . Smoking status: Never Smoker   . Smokeless tobacco: Never Used  . Alcohol Use: No  . Drug Use: No  . Sexual Activity: Yes    Birth Control/ Protection: IUD   Other Topics Concern  . Not on file   Social History Narrative   3 cups of tea a day     Family History  Problem Relation Age of Onset  . Heart disease Maternal Aunt   . Parkinsonism Maternal Grandfather     Past Medical History  Diagnosis Date  . Obesity   . Anxiety   . Vitamin D deficiency     History reviewed. No pertinent past surgical history.  Current Outpatient Prescriptions  Medication Sig Dispense Refill  . Cholecalciferol (VITAMIN D PO) Take 5,000 Units by mouth daily.    . DULERA 200-5 MCG/ACT AERO     . ibuprofen (ADVIL,MOTRIN) 200 MG tablet Take 400 mg by mouth every 6 (six) hours as needed for headache or moderate pain.    . mometasone (NASONEX) 50 MCG/ACT nasal spray     . montelukast (SINGULAIR) 10 MG tablet Take 1 tablet daily for allergies 30 tablet 99  . Prenatal Vit-Fe Fumarate-FA (PRENATAL MULTIVITAMIN) TABS tablet Take 1 tablet by mouth daily at 12 noon.    Marland Kitchen PROAIR RESPICLICK 108 (90 BASE) MCG/ACT AEPB     . Diclofenac Potassium 50 MG PACK Take 50 mg by mouth once as needed. Take once daily as needed with headache onset. Please take with food 9  each 11  . Topiramate ER (TROKENDI XR) 25 MG CP24 Take 25 mg by mouth at bedtime. 30 capsule 6   No current facility-administered medications for this visit.    Allergies as of 02/06/2015 - Review Complete 02/06/2015  Allergen Reaction Noted  . Peanut-containing drug products Other (See Comments) 09/27/2013  . Alcohol-sulfur [sulfur]  02/05/2013  . Lactose intolerance (gi)  06/10/2014    Vitals: BP 134/80 mmHg  Pulse 88  Ht 5\' 3"  (1.6 m)  Wt 183 lb 12.8 oz (83.371 kg)  BMI 32.57 kg/m2  LMP 02/02/2015 Last Weight:  Wt Readings from Last 1 Encounters:  02/06/15 183 lb 12.8 oz (83.371 kg)   Last  Height:   Ht Readings from Last 1 Encounters:  02/06/15 5\' 3"  (1.6 m)    Physical exam: Exam: Gen: NAD, conversant, well nourised, obese, well groomed                     CV: RRR, no MRG. No Carotid Bruits. No peripheral edema, warm, nontender Eyes: Conjunctivae clear without exudates or hemorrhage  Neuro: Detailed Neurologic Exam  Speech:    Speech is normal; fluent and spontaneous with normal comprehension.  Cognition:    The patient is oriented to person, place, and time;     recent and remote memory intact;     language fluent;     normal attention, concentration,     fund of knowledge Cranial Nerves:    The pupils are equal, round, and reactive to light. The fundi are normal and spontaneous venous pulsations are present. Visual fields are full to finger confrontation. Extraocular movements are intact. Trigeminal sensation is intact and the muscles of mastication are normal. The face is symmetric. The palate elevates in the midline. Hearing intact. Voice is normal. Shoulder shrug is normal. The tongue has normal motion without fasciculations.   Coordination:    Normal finger to nose and heel to shin. Normal rapid alternating movements.   Gait:    Heel-toe and tandem gait are normal.   Motor Observation:    No asymmetry, no atrophy, and no involuntary movements noted. Tone:    Normal muscle tone.    Posture:    Posture is normal. normal erect    Strength:    Strength is V/V in the upper and lower limbs.      Sensation: intact to LT     Reflex Exam:  DTR's:    Deep tendon reflexes in the upper and lower extremities are normal bilaterally.   Toes:    The toes are downgoing bilaterally.   Clonus:    Clonus is absent.      Assessment/Plan:  19 year old feet feel with body numbness and paresthesias which are likely migraine aura. CT of the head was unremarkable however we'll order MRI of the brain with and without contrast as this is more sensitive for  intracranial etiologies, patient declined contrast. We'll start can be a for acute management and Trokendi 25 mg at night for preventative. Discussed common side effects of Trokendi including tingling in the fingers, cognitive changes. Also discussed more rare side effects such as nephrolithiasis. Stop for anything concerning and call office. Discussed teratogenicity and risks with pregnancy, do not get pregnant.  Naomie Dean, MD  [facility] [address] [place], [place] [address]  Phone [phone] Fax [phone]

## 2015-02-06 NOTE — Patient Instructions (Addendum)
Remember to drink plenty of fluid, eat healthy meals and do not skip any meals. Try to eat protein with a every meal and eat a healthy snack such as fruit or nuts in between meals. Try to keep a regular sleep-wake schedule and try to exercise daily, particularly in the form of walking, 20-30 minutes a day, if you can.   As far as your medications are concerned, I would like to suggest: Cambia at onset of headache Trokendi  before bed every night if needed  As far as diagnostic testing: Mri of the brain   I would like to see you back as needed , sooner if we need to. Please call us with any interim questions, concerns, problems, updates or refill requests.   Please also call us for any test results so we can go over those with you on the phone.  My clinical assistant and will answer any of your questions and relay your messages to me and also relay most of my messages to you.   Our phone number is [phone]. We also have an after hours call service for urgent matters and there is a physician on-call for urgent questions. For any emergencies you know to call 911 or go to the nearest emergency room

## 2015-02-08 ENCOUNTER — Encounter: Payer: Self-pay | Admitting: Neurology

## 2015-02-08 DIAGNOSIS — G43109 Migraine with aura, not intractable, without status migrainosus: Secondary | ICD-10-CM | POA: Insufficient documentation

## 2015-02-08 MED ORDER — DICLOFENAC POTASSIUM(MIGRAINE) 50 MG PO PACK: 50.0000 mg | PACK | Freq: Once | ORAL | Status: DC | PRN

## 2015-02-10 ENCOUNTER — Ambulatory Visit (INDEPENDENT_AMBULATORY_CARE_PROVIDER_SITE_OTHER)
Admission: RE | Admit: 2015-02-10 | Discharge: 2015-02-10 | Disposition: A | Discharge status: Home / Self Care | Payer: BLUE CROSS/BLUE SHIELD | Source: Ambulatory Visit | Attending: Internal Medicine | Admitting: Internal Medicine

## 2015-02-10 ENCOUNTER — Ambulatory Visit (INDEPENDENT_AMBULATORY_CARE_PROVIDER_SITE_OTHER): Payer: BLUE CROSS/BLUE SHIELD | Admitting: Internal Medicine

## 2015-02-10 ENCOUNTER — Encounter: Payer: Self-pay | Admitting: Internal Medicine

## 2015-02-10 VITALS — BP 112/58 | HR 76 | Ht 63.39 in | Wt 181.0 lb

## 2015-02-10 DIAGNOSIS — F419 Anxiety disorder, unspecified: Secondary | ICD-10-CM

## 2015-02-10 DIAGNOSIS — K5900 Constipation, unspecified: Secondary | ICD-10-CM

## 2015-02-10 DIAGNOSIS — K589 Irritable bowel syndrome without diarrhea: Secondary | ICD-10-CM | POA: Diagnosis not present

## 2015-02-10 DIAGNOSIS — R1084 Generalized abdominal pain: Secondary | ICD-10-CM | POA: Diagnosis not present

## 2015-02-10 MED ORDER — DICYCLOMINE HCL 20 MG PO TABS: ORAL_TABLET | ORAL | Status: DC

## 2015-02-10 MED ORDER — LINACLOTIDE 145 MCG PO CAPS: 145.0000 ug | ORAL_CAPSULE | Freq: Every day | ORAL | Status: DC

## 2015-02-10 NOTE — Patient Instructions (Signed)
We have sent the following medications to your pharmacy for you to pick up at your convenience: Linzess 145 mcg daily Bentyl 20 mg 3 times daily x 2 weeks, then as needed thereafter  Your provider has requested that you have an abdominal x ray before leaving today. Please go to the basement floor to our Radiology department for the test.  Please call our office to let us know how you are doing. You may follow up with your physician in [place] if needed.

## 2015-02-10 NOTE — Progress Notes (Signed)
Patient ID: Lori West, female   DOB: 1996/04/04, 19 y.o.   MRN: [id number] HPI: Donovan Gatchel is a 19 year old female with past medical history of asthma, anxiety, depression, migraines, vitamin D deficiency and probable IBS who is seen at the request of Dr.McKeown to evaluate abdominal pain and constipation. She is here alone today. She reports when this appointment was made it was to discuss mid and upper abdominal pain worse after eating heavy and rich foods. This was associated with nausea but rarely vomiting. This is been an ongoing issue for several years and previously has led to evaluation with ultrasound and even HIDA scan. However about 2 weeks ago she had an IUD placed and developed post procedural discomfort. This led to a Percocet prescription which she took for 2 days. After this she developed what she states as severe constipation, abdominal fullness and upper abdominal pain. This is been associated with early satiety and pain after eating. She tried multiple laxative including stool softeners, MiraLAX and fiber without result. She then drank warm prune juice and milk of magnesia. This resulted in moderately diarrhea. Over the last several days she's been using milk of magnesia but is having diarrhea with this medicine. She still does not feel better. She is having diffuse abdominal discomfort and bloating. She is using ibuprofen for pelvic discomfort associated with her Mirena placement. She had a follow-up pelvic ultrasound which shows the IUD in good position. On review of her primary care note Amitiza was recommended for constipation but she never tried this. She does take vitamin D given vitamin D deficiency and is using her inhalers for her history of asthma. She also has topiramate for as needed migraines which is a new medicine that she has not yet started.  Today she is worried about bowel obstruction because she read about her symptoms on the Internet.  She plans to return to  [place] to school where she is studying criminal justice at the end of this week. She states she will be returning "if I don't die"  Past Medical History  Diagnosis Date  . Obesity   . Anxiety   . Vitamin D deficiency   . Headache   . Asthma   . Depression     Past Surgical History  Procedure Laterality Date  . Intrauterine device insertion    . Adenoidectomy      Outpatient Prescriptions Prior to Visit  Medication Sig Dispense Refill  . Cholecalciferol (VITAMIN D PO) Take 5,000 Units by mouth daily.    . Diclofenac Potassium 50 MG PACK Take 50 mg by mouth once as needed. Take once daily as needed with headache onset. Please take with food 9 each 11  . DULERA 200-5 MCG/ACT AERO     . ibuprofen (ADVIL,MOTRIN) 200 MG tablet Take 400 mg by mouth every 6 (six) hours as needed for headache or moderate pain.    . mometasone (NASONEX) 50 MCG/ACT nasal spray     . montelukast (SINGULAIR) 10 MG tablet Take 1 tablet daily for allergies 30 tablet 99  . Prenatal Vit-Fe Fumarate-FA (PRENATAL MULTIVITAMIN) TABS tablet Take 1 tablet by mouth daily at 12 noon.    Marland Kitchen PROAIR RESPICLICK 108 (90 BASE) MCG/ACT AEPB     . Topiramate ER (TROKENDI XR) 25 MG CP24 Take 25 mg by mouth at bedtime. 30 capsule 6   No facility-administered medications prior to visit.    Allergies  Allergen Reactions  . Peanut-Containing Drug Products Other (See Comments)  Tingling and numbness in throat from tree nuts  . Alcohol-Sulfur [Sulfur]   . Lactose Intolerance (Gi)     Not a true allergy- causes acne breakouts    Family History  Problem Relation Age of Onset  . Heart disease Maternal Aunt   . Parkinsonism Maternal Grandfather   . Migraines Mother   . Skin cancer Maternal Aunt   . Diabetes Maternal Grandmother     Social History  Substance Use Topics  . Smoking status: Never Smoker   . Smokeless tobacco: Never Used  . Alcohol Use: No    ROS: As per history of present illness, otherwise  negative  BP 112/58 mmHg  Pulse 76  Ht 5' 3.39" (1.61 m)  Wt 181 lb (82.101 kg)  BMI 31.67 kg/m2  LMP 02/02/2015 Constitutional: Well-developed and well-nourished. Patient appears in no distress and is smiles throughout the encounter HEENT: Normocephalic and atraumatic. Oropharynx is clear and moist. No oropharyngeal exudate. Conjunctivae are normal.  No scleral icterus. Neck: Neck supple. Trachea midline. Cardiovascular: Normal rate, regular rhythm and intact distal pulses. No M/R/G Pulmonary/chest: Effort normal and breath sounds normal. No wheezing, rales or rhonchi. Abdominal: Soft, diffuse tenderness without rebound or guarding, nondistended. Bowel sounds active throughout. There are no masses palpable. No hepatosplenomegaly. Extremities: no clubbing, cyanosis, or edema Lymphadenopathy: No cervical adenopathy noted. Neurological: Alert and oriented to person place and time. Skin: Skin is warm and dry. No rashes noted. Psychiatric: Normal mood and affect. Behavior is normal.  RELEVANT LABS AND IMAGING: CBC    Component Value Date/Time   WBC 8.6 12/25/2014 1203   RBC 4.47 12/25/2014 1203   HGB 14.2 12/25/2014 1203   HCT 40.4 12/25/2014 1203   PLT 296 12/25/2014 1203   MCV 90.4 12/25/2014 1203   MCH 31.8 12/25/2014 1203   MCHC 35.1 12/25/2014 1203   RDW 13.0 12/25/2014 1203   LYMPHSABS 2.6 08/29/2014 0946   MONOABS 0.8 08/29/2014 0946   EOSABS 0.2 08/29/2014 0946   BASOSABS 0.1 08/29/2014 0946    CMP     Component Value Date/Time   NA 141 12/25/2014 1203   K 3.9 12/25/2014 1203   CL 108 12/25/2014 1203   CO2 24 12/25/2014 1203   GLUCOSE 104* 12/25/2014 1203   BUN 9 12/25/2014 1203   CREATININE 0.57 12/25/2014 1203   CREATININE 0.56 08/29/2014 0946   CALCIUM 9.6 12/25/2014 1203   PROT 7.1 08/29/2014 0946   ALBUMIN 4.7 08/29/2014 0946   AST 14 08/29/2014 0946   ALT <8 08/29/2014 0946   ALKPHOS 92 08/29/2014 0946   BILITOT 0.5 08/29/2014 0946   GFRNONAA >60  12/25/2014 1203   GFRNONAA >89 08/29/2014 0946   GFRAA >60 12/25/2014 1203   GFRAA >89 08/29/2014 0946   Celiac panel neg  Lab Results  Component Value Date   TSH 2.423 08/29/2014   NUCLEAR MEDICINE HEPATOBILIARY IMAGING WITH GALLBLADDER EF   TECHNIQUE: Sequential images of the abdomen were obtained out to 60 minutes following intravenous administration of radiopharmaceutical. After oral ingestion of Ensure, gallbladder ejection fraction was determined.   COMPARISON:  01/30/2013   RADIOPHARMACEUTICALS:  5 mCi technetium 99 Choletec ; 1.6 micro g of CCK   FINDINGS: There is satisfactory uptake of radiopharmaceutical from the blood pool. Biliary and bowel activity visible at 10 min. Gallbladder activity visible at 30 min. Some retrograde flow of radiopharmaceutical into the stomach is observed.   Over a 26 min observation, gallbladder ejection with CCK infusion is 31% (normally greater  than 30%).   IMPRESSION: 1. Gallbladder ejection fraction is at the lower limit of the normal range. Otherwise normal exam.     Electronically Signed   By: Herbie Baltimore   On: 02/13/2013 09:54  _______________________________________________________________________________________________ ULTRASOUND ABDOMEN COMPLETE   COMPARISON:  Ultrasound 02/17/2013   FINDINGS: Gallbladder: No gallstones or wall thickening visualized. No sonographic Murphy sign noted.   Common bile duct: Diameter: 2 mm   Liver: No focal lesion identified. Within normal limits in parenchymal echogenicity.   IVC: No abnormality visualized.   Pancreas: Visualized portion unremarkable.   Spleen: Size and appearance within normal limits.   Right Kidney: Length: 10.9 cm. Echogenicity within normal limits. No mass or hydronephrosis visualized.   Left Kidney: Length: 10.7 cm. Echogenicity within normal limits. No mass or hydronephrosis visualized.   Abdominal aorta: No aneurysm visualized.   Other  findings: None.   IMPRESSION: Normal     Electronically Signed   By: Marlan Palau M.D.   On: 06/04/2014 09:07 ____________________________________________________________________________________  TRANSABDOMINAL AND TRANSVAGINAL ULTRASOUND OF PELVIS   TECHNIQUE: Both transabdominal and transvaginal ultrasound examinations of the pelvis were performed. Transabdominal technique was performed for global imaging of the pelvis including uterus, ovaries, adnexal regions, and pelvic cul-de-sac. It was necessary to proceed with endovaginal exam following the transabdominal exam to visualize the uterus and ovaries in greater detail.   COMPARISON:  None   FINDINGS: Uterus   Measurements: 5.0 x 2.9 x 3.6 cm. No fibroids or other mass visualized.   Endometrium   Not visualized due to the patient's intrauterine device. The intrauterine device is noted in expected position at the fundus of the uterus, per correlation with technologist visualization, though this is not well seen on 3D images.   Right ovary   Measurements: 3.0 x 1.7 x 2.5 cm. Normal appearance/no adnexal mass.   Left ovary   Measurements: 2.4 x 1.3 x 1.6 cm. Normal appearance/no adnexal mass.   Other findings   No free fluid is seen within the pelvic cul-de-sac.   IMPRESSION: Intrauterine device noted in expected position at the fundus of the uterus. Unremarkable pelvic ultrasound.     Electronically Signed   By: Roanna Raider M.D.   On: 02/03/2015 20:43    ASSESSMENT/PLAN: 19 year old female with past medical history of asthma, anxiety, depression, migraines, vitamin D deficiency and probable IBS who is seen at the request of Dr.McKeown to evaluate abdominal pain and constipation.   1. Constipation/abd pain/nausea/IBS -- most symptoms consistent with irritable bowel, likely exacerbated by narcotics used for pelvic pain associated with IUD placement. We spent time today discussing irritable bowel and  constipation. I have provided reassurance that I do not think she has a bowel obstruction. She would like reassurance and so 2 view abdominal x-ray ordered today. I recommended repeat labs to include CRP but she defers labs today because of her fear of needles. Will try her on Bentyl 20 mg 3 times daily for 2-4 weeks for abdominal discomfort associated with IBS. Constipation predominant and so we'll try Linzess 145 g daily. We discussed the side effects of these medications and should she develop worsening symptoms including diarrhea with Linzess I asked that she notify me. She voices understanding. She is returning to school and so can follow with me as needed. I encouraged her to seek medical care at her university if symptoms fail to improve when she returns to college. Imaging studies reviewed, no evidence of gallstones. HIDA scan did reveal borderline gallbladder ejection  fraction raising the question of biliary dyskinesia. If symptoms fail to respond IBS therapy, repeat HIDA scan with CCK can be considered  2. Anxiety -- chronic likely exacerbating #1 above. I asked that she discuss this with primary care.    [employer] [address] [place], [place] [address]

## 2015-02-11 ENCOUNTER — Ambulatory Visit (INDEPENDENT_AMBULATORY_CARE_PROVIDER_SITE_OTHER): Payer: Self-pay

## 2015-02-11 ENCOUNTER — Telehealth: Payer: Self-pay | Admitting: Internal Medicine

## 2015-02-11 DIAGNOSIS — R51 Headache with orthostatic component, not elsewhere classified: Secondary | ICD-10-CM

## 2015-02-11 DIAGNOSIS — G43109 Migraine with aura, not intractable, without status migrainosus: Secondary | ICD-10-CM | POA: Diagnosis not present

## 2015-02-11 DIAGNOSIS — R519 Headache, unspecified: Secondary | ICD-10-CM

## 2015-02-11 DIAGNOSIS — G43509 Persistent migraine aura without cerebral infarction, not intractable, without status migrainosus: Secondary | ICD-10-CM

## 2015-02-11 DIAGNOSIS — Z0289 Encounter for other administrative examinations: Secondary | ICD-10-CM

## 2015-02-11 DIAGNOSIS — H9190 Unspecified hearing loss, unspecified ear: Secondary | ICD-10-CM

## 2015-02-11 MED ORDER — GLYCOPYRROLATE 1 MG PO TABS: 1.0000 mg | ORAL_TABLET | Freq: Three times a day (TID) | ORAL | Status: DC

## 2015-02-11 NOTE — Telephone Encounter (Signed)
Discontinue Bentyl trial of Robinul forte  BID-TID PRN

## 2015-02-11 NOTE — Telephone Encounter (Signed)
Attempted to call pt but received message that "the voicemail has not been set up yet." Will try again later.

## 2015-02-11 NOTE — Telephone Encounter (Signed)
Spoke with pt and let her know new prescription has been sent to her pharmacy. Discussed with pt that for an impaction she would need to go to the ER. Pt had xray done yesterday and it was fine showing no obstruction. Pt will pick up Robinol and try this medication.

## 2015-02-11 NOTE — Telephone Encounter (Signed)
Pt states the bentyl is causing her to have blurred vision and drowsiness. Pt states she can take this at night but not during the day. States she is in a lot of pain and she needs something to take now that will not make her drowsy. Please advise.

## 2015-02-11 NOTE — Telephone Encounter (Signed)
Patient called back stating that she doesn't think that medication will help her, she wants to be seen in the office today. She states that she has an impaction and cannot eat.

## 2015-02-11 NOTE — Telephone Encounter (Signed)
Attempted to call pt and received message that voicemail has not been set up. Script sent to pharmacy.

## 2015-02-16 ENCOUNTER — Telehealth: Payer: Self-pay | Admitting: *Deleted

## 2015-02-16 NOTE — Telephone Encounter (Signed)
-----   Message from Anson Fret, MD sent at 02/13/2015  2:04 PM EDT ----- Let patient know her mri of the brain is stable as compared to 2008, no changes. thanks

## 2015-02-16 NOTE — Telephone Encounter (Signed)
Spoke with pt about MRI brain being stable as compared to 2008, no changes. Pt verbalized understanding.

## 2015-02-18 ENCOUNTER — Other Ambulatory Visit: Payer: Self-pay | Admitting: Internal Medicine

## 2015-02-24 ENCOUNTER — Telehealth: Payer: Self-pay | Admitting: Internal Medicine

## 2015-02-24 NOTE — Telephone Encounter (Signed)
GI pathogen panel -- stool testing

## 2015-02-24 NOTE — Telephone Encounter (Signed)
Spoke with pt and let her know Dr. Lauro Franklin recommendation. Pt states she is in school in [place]. She has an appt with the school physician and will see if they can perform this lab. Discussed with pt that she could be seen at an urgent care there also. Pt verbalized understanding and states she will have labs faxed here for Dr. Rhea Belton to review.

## 2015-02-24 NOTE — Telephone Encounter (Signed)
Pt states she was doing good taking the bentyl and linzess. States she had stopped the linzess and was only taking magnesium tablets. Now pt states she is having diarrhea and it has been going on for over a week. She has stopped the magnesium and now is taking a probiotic. Pt states she has not been on any antibiotics. Pt is concerned that she might have some sort of infection. Please advise.

## 2015-03-10 ENCOUNTER — Telehealth: Payer: Self-pay | Admitting: Internal Medicine

## 2015-03-10 NOTE — Telephone Encounter (Signed)
Pt states that she is now at school in [place] and the digestive specialist did an colonoscopy on her that came back as showed gastritis. Biopsy showed no colitis. pt states that her stool samples came back pos with c-diff and she wants to know how she could have gotten c-diff. She states that the dr has put her on flagyl and wants to know if this is the best course of action that she needs to take.

## 2015-03-10 NOTE — Telephone Encounter (Signed)
Discussed with pt that it sounds like she is being treated appropriately. Pt would like for Dr. Rhea Belton to have her records from [place]. Pt was seen by a Dr. Kendal Hymen at [facility] in [place].

## 2015-03-12 NOTE — Telephone Encounter (Signed)
Called Dr. Starleen Arms' office at [phone]. Office is faxing records.

## 2015-03-15 ENCOUNTER — Other Ambulatory Visit: Payer: Self-pay | Admitting: Internal Medicine

## 2015-03-19 ENCOUNTER — Ambulatory Visit (INDEPENDENT_AMBULATORY_CARE_PROVIDER_SITE_OTHER): Payer: BLUE CROSS/BLUE SHIELD | Admitting: Physician Assistant

## 2015-03-19 ENCOUNTER — Encounter: Payer: Self-pay | Admitting: Physician Assistant

## 2015-03-19 VITALS — BP 100/70 | HR 86 | Temp 98.1°F | Resp 16 | Ht 63.0 in | Wt 179.0 lb

## 2015-03-19 DIAGNOSIS — N926 Irregular menstruation, unspecified: Secondary | ICD-10-CM | POA: Diagnosis not present

## 2015-03-19 DIAGNOSIS — Z79899 Other long term (current) drug therapy: Secondary | ICD-10-CM | POA: Diagnosis not present

## 2015-03-19 DIAGNOSIS — E559 Vitamin D deficiency, unspecified: Secondary | ICD-10-CM

## 2015-03-19 DIAGNOSIS — D649 Anemia, unspecified: Secondary | ICD-10-CM

## 2015-03-19 DIAGNOSIS — F419 Anxiety disorder, unspecified: Secondary | ICD-10-CM

## 2015-03-19 LAB — HEPATIC FUNCTION PANEL
ALBUMIN: 4.5 g/dL (ref 3.6–5.1)
ALT: 9 U/L (ref 6–29)
AST: 15 U/L (ref 10–30)
Alkaline Phosphatase: 70 U/L (ref 33–115)
BILIRUBIN TOTAL: 0.7 mg/dL (ref 0.2–1.2)
Bilirubin, Direct: 0.2 mg/dL (ref <=0.2)
Indirect Bilirubin: 0.5 mg/dL (ref 0.2–1.2)
Total Protein: 6.8 g/dL (ref 6.1–8.1)

## 2015-03-19 LAB — CBC WITH DIFFERENTIAL/PLATELET
BASOS ABS: 0 10*3/uL (ref 0.0–0.1)
Basophils Relative: 0 % (ref 0–1)
EOS ABS: 0.2 10*3/uL (ref 0.0–0.7)
EOS PCT: 2 % (ref 0–5)
HCT: 40.7 % (ref 36.0–46.0)
Hemoglobin: 13.9 g/dL (ref 12.0–15.0)
LYMPHS ABS: 2.8 10*3/uL (ref 0.7–4.0)
Lymphocytes Relative: 35 % (ref 12–46)
MCH: 31.6 pg (ref 26.0–34.0)
MCHC: 34.2 g/dL (ref 30.0–36.0)
MCV: 92.5 fL (ref 78.0–100.0)
MPV: 10.2 fL (ref 8.6–12.4)
Monocytes Absolute: 0.7 10*3/uL (ref 0.1–1.0)
Monocytes Relative: 9 % (ref 3–12)
Neutro Abs: 4.4 10*3/uL (ref 1.7–7.7)
Neutrophils Relative %: 54 % (ref 43–77)
PLATELETS: 320 10*3/uL (ref 150–400)
RBC: 4.4 MIL/uL (ref 3.87–5.11)
RDW: 13.5 % (ref 11.5–15.5)
WBC: 8.1 10*3/uL (ref 4.0–10.5)

## 2015-03-19 LAB — IRON AND TIBC
%SAT: 34 % (ref 11–50)
IRON: 93 ug/dL (ref 40–190)
TIBC: 272 ug/dL (ref 250–450)
UIBC: 179 ug/dL (ref 125–400)

## 2015-03-19 LAB — BASIC METABOLIC PANEL WITH GFR
BUN: 9 mg/dL (ref 7–25)
CALCIUM: 9.6 mg/dL (ref 8.6–10.2)
CO2: 26 mmol/L (ref 20–31)
CREATININE: 0.57 mg/dL (ref 0.50–1.10)
Chloride: 104 mmol/L (ref 98–110)
GFR, Est Non African American: >89 mL/min (ref >=60)
Glucose, Bld: 89 mg/dL (ref 65–99)
POTASSIUM: 4 mmol/L (ref 3.5–5.3)
Sodium: 139 mmol/L (ref 135–146)

## 2015-03-19 LAB — MAGNESIUM: Magnesium: 1.9 mg/dL (ref 1.5–2.5)

## 2015-03-19 MED ORDER — DULOXETINE HCL 20 MG PO CPEP: 20.0000 mg | ORAL_CAPSULE | Freq: Every day | ORAL | Status: DC

## 2015-03-19 NOTE — Progress Notes (Signed)
Assessment and Plan: Anxiety- check labs- will try cymbalta  daily, start new medication prescribed, stress management techniques discussed, increase water, good sleep hygiene discussed, increase exercise, and increase veggies. Follow up 1 month, call the office if any new AE's from medications and we will switch them.  Cdiff- will get tested 3 weeks.  Vitamin D - check labs.     HPI 19 y.o.female presents for follow up/advise for Cdiff treatment. She has had a long history of IBS, had gotten worse recently, has seen Dr. Rhea Belton as well as Dr. Starleen Arms at [facility] in [place], had normal colonoscopy and + cdiff.  Despite having a hard time with the medication she had 7 days of flaygl and diarrhea is better, will get retested in 3 weeks, however she is still having "internal" shaking, and decreased sleeping and dizziness, she states that BP is better, rash is better, flushing is better, HA. Had tried ativan and benadryl at night but it did not help.   She has tried lexapro, zoloft, prozac, buspar, lamictal, depakote.   She has been on vitamin D 5000 every day , but has been out for 2 weeks.   Past Medical History  Diagnosis Date  . Obesity   . Anxiety   . Vitamin D deficiency   . Headache   . Asthma   . Depression      Allergies  Allergen Reactions  . Peanut-Containing Drug Products Other (See Comments)    Tingling and numbness in throat from tree nuts  . Alcohol-Sulfur [Sulfur]   . Lactose Intolerance (Gi)     Not a true allergy- causes acne breakouts      Current Outpatient Prescriptions on File Prior to Visit  Medication Sig Dispense Refill  . Cholecalciferol (VITAMIN D PO) Take 5,000 Units by mouth daily.    Marland Kitchen dicyclomine (BENTYL) 20 MG tablet Take 1 tablet by mouth 3 times daily x 2 weeks, then as needed thereafter. 90 tablet 2  . DULERA 200-5 MCG/ACT AERO     . glycopyrrolate (ROBINUL) 1 MG tablet Take 1 tablet (1 mg total) by mouth 3 (three)  times daily. 90 tablet 2  . ibuprofen (ADVIL,MOTRIN) 200 MG tablet Take 400 mg by mouth every 6 (six) hours as needed for headache or moderate pain.    . Linaclotide (LINZESS) 145 MCG CAPS capsule Take 1 capsule (145 mcg total) by mouth daily. 30 capsule 4  . mometasone (NASONEX) 50 MCG/ACT nasal spray     . montelukast (SINGULAIR) 10 MG tablet Take 1 tablet daily for allergies 30 tablet 99  . Prenatal Vit-Fe Fumarate-FA (PRENATAL MULTIVITAMIN) TABS tablet Take 1 tablet by mouth daily at 12 noon.    Marland Kitchen PROAIR RESPICLICK 108 (90 BASE) MCG/ACT AEPB     . Topiramate ER (TROKENDI XR) 25 MG CP24 Take 25 mg by mouth at bedtime. 30 capsule 6   No current facility-administered medications on file prior to visit.    ROS: all negative except above.   Physical Exam: Filed Weights   03/19/15 1141  Weight: 179 lb (81.194 kg)   BP 100/70 mmHg  Pulse 86  Temp(Src) 98.1 F (36.7 C) (Temporal)  Resp 16  Ht  (1.6 m)  Wt 179 lb (81.194 kg)  BMI 31.72 kg/m2  SpO2 96%  LMP 02/28/2015 (Exact Date) General Appearance: Well nourished, in no apparent distress. Eyes: PERRLA, EOMs, conjunctiva no swelling or erythema Sinuses: No Frontal/maxillary tenderness ENT/Mouth: Ext aud canals clear, TMs without erythema,  bulging. No erythema, swelling, or exudate on post pharynx.  Tonsils not swollen or erythematous. Hearing normal.  Neck: Supple, thyroid normal.  Respiratory: Respiratory effort normal, BS equal bilaterally without rales, rhonchi, wheezing or stridor.  Cardio: RRR with no MRGs. Brisk peripheral pulses without edema.  Abdomen: Soft, + BS.  Diffuse tenderness, no guarding, rebound, hernias, masses. Lymphatics: Non tender without lymphadenopathy.  Musculoskeletal: Full ROM, 5/5 strength, normal gait.  Skin: Warm, dry without rashes, lesions, ecchymosis.  Neuro: Cranial nerves intact. Normal muscle tone, no cerebellar symptoms. Sensation intact.  Psych: Awake and oriented X 3, normal affect,  Insight and Judgment appropriate.     Quentin Mulling, PA-C 11:46 AM [facility]

## 2015-03-20 LAB — FERRITIN: Ferritin: 78 ng/mL (ref 10–291)

## 2015-03-20 LAB — TSH: TSH: 2.664 u[IU]/mL (ref 0.350–4.500)

## 2015-03-20 LAB — VITAMIN D 25 HYDROXY (VIT D DEFICIENCY, FRACTURES): VIT D 25 HYDROXY: 31 ng/mL (ref 30–100)

## 2015-03-24 ENCOUNTER — Telehealth: Payer: Self-pay | Admitting: Neurology

## 2015-03-24 NOTE — Telephone Encounter (Signed)
Patient is calling because for a couple of weeks and while/after taking an antibiotic she has become shaky inside. Please call.

## 2015-03-24 NOTE — Telephone Encounter (Signed)
Called pt back. Advised she should go to her PCP to get checked to make sure nothing else is going on. She stated she had already done this and they did labs and told her everything was okay. She said they think it is related to stress/anxiety and this could be causing her symptoms. She wanted Dr. Lucia Gaskins perspective. She knows her imaging and labs came back normal. She thinks it is something neurological going on. I advised Dr. Lucia Gaskins is out of the office until Thursday due to a family emergency. I will message the work-in Dr. To see if they have any suggestions. She verbalized understanding.

## 2015-03-25 NOTE — Telephone Encounter (Signed)
[place] . She felt inner trembling, shakiness and asked if this could be related to ATB . The 2 antibiotics d given to her were not Blood Brain Barrier penetrating and I doubt these can cause a CNS side effect.  She was satisfied. CD

## 2015-03-31 NOTE — Telephone Encounter (Signed)
Lori West, I think she should go and see her school physician or a primary care doc down there. I am sure they have physicians on campus. They can evaluate her and send her to a neurologist if clinically warranted. I would prefer a primary care doctor in [place] to refer her because I don't know any neurologists in [place] or which neurologists in [place] take her insurance.So she should really have someone down there refer her. And if she is not feeling well, the important thing is to go see a doctor soon and not wait for a referral to a specialist.  I recommend her on-site college providers as a first step.   If she does go see a neurologist, she can call us with his/her name and we are more than happy to send records over. Thanks.

## 2015-03-31 NOTE — Telephone Encounter (Addendum)
Pt called and expressed that she is still having some trembling and shakiness. She is also back at school in [place] and would like a referral to a local Neurologist there while in school. Please call and advise [phone]. Pt is asking the Dr. Lucia Gaskins call her instead of the nurse.

## 2015-04-01 NOTE — Telephone Encounter (Signed)
Called pt back. Explained that I spoke with Dr. Lucia GaskinsAhern and she was with pt right now and I wanted to relay her message for her. She would like her to see a school physician or PCP where she is. They can evaluate her and refer her to a neurologist if clinically warranted. She would prefer a PCP in [place] to refer because she is not familiar with any neurologist in that area or which ones take her insurance. If she is not feeling well, she needs to make sure to be seen and not wait for referral to specialist. She stated she went and saw [place] yesterday and they ended up referring her to a neurologist and cardiologist. She has an appointment with the neurologist on Monday. I told her we are more than happy to send the records, she will either have to sign a release form at our office or at the neurology office she is going to on  Monday. If they would like her imaging on CD, [facility]pt stated this is where she went) and have them mail her a copy or she can pick one up. Whichever is more convenient for her. She verbalized understanding and appreciation. Told her to call back if she had further questions.

## 2015-04-09 ENCOUNTER — Telehealth: Payer: Self-pay | Admitting: Neurology

## 2015-04-09 NOTE — Telephone Encounter (Signed)
Patient is calling. She recently saw a neurologist in [place] and the neurologist there looked at the results of the patient's last MRI that Dr. Lucia GaskinsAhern ordered.That neurologist stated the MRI was not normal and the patient says when our office called with the results she was told the MRI was normal. The patient is very concerned. Please call the patient and discuss. Thank you.

## 2015-04-09 NOTE — Telephone Encounter (Signed)
Called pt back. Advised that she cannot have an office visit and EEG on the same day because of insurance reasons. They will not cover both in the same day. She understands. She could only come on 11/4 and 11/22. She is going to keep office visit on 11/22 with Dr. Lucia GaskinsAhern. Was going to schedule her EEG on 11/22 and switch her appt with Dr. Lucia GaskinsAhern to 11/4. Karin GoldenLorraine schedule was blocked at this time. I told her I will have someone call her back tomorrow to schedule her and find out lorraine's availability. She understands.

## 2015-04-09 NOTE — Telephone Encounter (Signed)
Spoke to patient, we let her know that her MRI is stable from 2008 not normal. 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 It is hard to say when it occurred, she could have been born with it. Extensive workup has been negative to date including cardiac. I reassured patient it doesn't not look like MS. She will follow up with me in November for further discussion.  Lori West, can we get an eeg scheduled for patient on the same day as her next visit? Just a routine office EEG. I think she comes back in Novemebr. And can we make sure she has a 30 minute appointment and not 15? Thank you! Thanks

## 2015-04-13 NOTE — Telephone Encounter (Signed)
Dr Lucia GaskinsAhern-- pt is scheduled for 06/10/15 at 1030am for EEG. Thank you.

## 2015-04-13 NOTE — Telephone Encounter (Signed)
Thank yoU !!! 

## 2015-04-13 NOTE — Telephone Encounter (Signed)
thanks

## 2015-04-13 NOTE — Telephone Encounter (Signed)
That's fine, I don't think she has seizures. The EEG can wait. thanks

## 2015-04-20 ENCOUNTER — Encounter: Payer: Self-pay | Admitting: *Deleted

## 2015-04-24 ENCOUNTER — Ambulatory Visit (INDEPENDENT_AMBULATORY_CARE_PROVIDER_SITE_OTHER): Payer: BLUE CROSS/BLUE SHIELD | Admitting: Physician Assistant

## 2015-04-24 ENCOUNTER — Encounter: Payer: Self-pay | Admitting: Physician Assistant

## 2015-04-24 VITALS — BP 120/60 | HR 67 | Temp 97.5°F | Resp 14 | Ht 63.0 in | Wt 180.0 lb

## 2015-04-24 DIAGNOSIS — R1030 Lower abdominal pain, unspecified: Secondary | ICD-10-CM

## 2015-04-24 DIAGNOSIS — E559 Vitamin D deficiency, unspecified: Secondary | ICD-10-CM

## 2015-04-24 LAB — CBC WITH DIFFERENTIAL/PLATELET
BASOS ABS: 0.1 10*3/uL (ref 0.0–0.1)
Basophils Relative: 1 % (ref 0–1)
Eosinophils Absolute: 0.2 10*3/uL (ref 0.0–0.7)
Eosinophils Relative: 3 % (ref 0–5)
HEMATOCRIT: 41.1 % (ref 36.0–46.0)
HEMOGLOBIN: 14 g/dL (ref 12.0–15.0)
LYMPHS PCT: 38 % (ref 12–46)
Lymphs Abs: 2.9 10*3/uL (ref 0.7–4.0)
MCH: 31.9 pg (ref 26.0–34.0)
MCHC: 34.1 g/dL (ref 30.0–36.0)
MCV: 93.6 fL (ref 78.0–100.0)
MONO ABS: 0.7 10*3/uL (ref 0.1–1.0)
MPV: 9.8 fL (ref 8.6–12.4)
Monocytes Relative: 9 % (ref 3–12)
NEUTROS ABS: 3.7 10*3/uL (ref 1.7–7.7)
Neutrophils Relative %: 49 % (ref 43–77)
Platelets: 331 10*3/uL (ref 150–400)
RBC: 4.39 MIL/uL (ref 3.87–5.11)
RDW: 12.9 % (ref 11.5–15.5)
WBC: 7.6 10*3/uL (ref 4.0–10.5)

## 2015-04-24 LAB — COMPREHENSIVE METABOLIC PANEL
ALBUMIN: 4.6 g/dL (ref 3.6–5.1)
ALK PHOS: 104 U/L (ref 33–115)
ALT: 9 U/L (ref 6–29)
AST: 16 U/L (ref 10–30)
BILIRUBIN TOTAL: 0.6 mg/dL (ref 0.2–1.2)
BUN: 10 mg/dL (ref 7–25)
CALCIUM: 9.4 mg/dL (ref 8.6–10.2)
CO2: 25 mmol/L (ref 20–31)
Chloride: 105 mmol/L (ref 98–110)
Creat: 0.54 mg/dL (ref 0.50–1.10)
Glucose, Bld: 87 mg/dL (ref 65–99)
Potassium: 4.3 mmol/L (ref 3.5–5.3)
Sodium: 140 mmol/L (ref 135–146)
TOTAL PROTEIN: 7.1 g/dL (ref 6.1–8.1)
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
## 2015-04-24 NOTE — Patient Instructions (Signed)

## 2015-04-25 LAB — VITAMIN D 25 HYDROXY (VIT D DEFICIENCY, FRACTURES): Vit D, 25-Hydroxy: 34 ng/mL (ref 30–100)

## 2015-04-25 LAB — URINALYSIS, ROUTINE W REFLEX MICROSCOPIC
Bilirubin Urine: NEGATIVE
Glucose, UA: NEGATIVE
HGB URINE DIPSTICK: NEGATIVE
Ketones, ur: NEGATIVE
LEUKOCYTES UA: NEGATIVE
NITRITE: NEGATIVE
PROTEIN: NEGATIVE
Specific Gravity, Urine: 1.014 (ref 1.001–1.035)
pH: 7 (ref 5.0–8.0)

## 2015-04-27 ENCOUNTER — Ambulatory Visit (INDEPENDENT_AMBULATORY_CARE_PROVIDER_SITE_OTHER): Payer: BLUE CROSS/BLUE SHIELD | Admitting: Neurology

## 2015-04-27 ENCOUNTER — Encounter: Payer: Self-pay | Admitting: Neurology

## 2015-04-27 VITALS — BP 118/78 | HR 84 | Wt 181.6 lb

## 2015-04-27 DIAGNOSIS — F419 Anxiety disorder, unspecified: Secondary | ICD-10-CM

## 2015-04-27 DIAGNOSIS — G43109 Migraine with aura, not intractable, without status migrainosus: Secondary | ICD-10-CM

## 2015-04-27 MED ORDER — CITALOPRAM HYDROBROMIDE 10 MG PO TABS: 10.0000 mg | ORAL_TABLET | Freq: Every day | ORAL | Status: DC
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
## 2015-04-27 NOTE — Patient Instructions (Signed)
Overall you are doing fairly well but I do want to suggest a few things today:   Remember to drink plenty of fluid, eat healthy meals and do not skip any meals. Try to eat protein with a every meal and eat a healthy snack such as fruit or nuts in between meals. Try to keep a regular sleep-wake schedule and try to exercise daily, particularly in the form of walking, 20-30 minutes a day, if you can.   As far as your medications are concerned, I would like to suggest  As far as diagnostic testing:   I would like to see you back in XXXXX, sooner if we need to. Please call us with any interim questions, concerns, problems, updates or refill requests.   Please also call us for any test results so we can go over those with you on the phone.  My clinical assistant and will answer any of your questions and relay your messages to me and also relay most of my messages to you.   Our phone number is [phone]. We also have an after hours call service for urgent matters and there is a physician on-call for urgent questions. For any emergencies you know to call 911 or go to the nearest emergency room   

## 2015-05-12 ENCOUNTER — Encounter: Payer: Self-pay | Admitting: Internal Medicine

## 2015-05-12 ENCOUNTER — Ambulatory Visit: Payer: BLUE CROSS/BLUE SHIELD | Admitting: Neurology

## 2015-05-12 ENCOUNTER — Ambulatory Visit (INDEPENDENT_AMBULATORY_CARE_PROVIDER_SITE_OTHER): Payer: BLUE CROSS/BLUE SHIELD | Admitting: Internal Medicine

## 2015-05-12 ENCOUNTER — Other Ambulatory Visit: Payer: BLUE CROSS/BLUE SHIELD

## 2015-05-12 VITALS — BP 122/76 | HR 88 | Ht 63.39 in | Wt 185.4 lb

## 2015-05-12 DIAGNOSIS — A09 Infectious gastroenteritis and colitis, unspecified: Secondary | ICD-10-CM

## 2015-05-12 DIAGNOSIS — K589 Irritable bowel syndrome without diarrhea: Secondary | ICD-10-CM | POA: Diagnosis not present

## 2015-05-12 DIAGNOSIS — R197 Diarrhea, unspecified: Secondary | ICD-10-CM

## 2015-05-12 DIAGNOSIS — Z8719 Personal history of other diseases of the digestive system: Secondary | ICD-10-CM | POA: Diagnosis not present

## 2015-05-12 DIAGNOSIS — Z8619 Personal history of other infectious and parasitic diseases: Secondary | ICD-10-CM

## 2015-05-12 NOTE — Patient Instructions (Signed)
Your physician has requested that you go to the basement for the following lab work before leaving today: C Diff by PCR  Please continue your probiotics.  Take gaviscon over the counter as needed for burning abdominal pain.

## 2015-05-12 NOTE — Progress Notes (Signed)
   Subjective:    Patient ID: Lori West, female    DOB: 10/20/1995, 19 y.o.   MRN: [id number]  HPI Lori West is a 19 year old female with IBS, anxiety, depression, migraines, and recent C. difficile who seen for follow-up. She was initially seen in August 2016 to evaluate constipation, abdominal pain and nausea. She was started on Linzess for diarrhea and initially this helped. She did not respond to anti-spasmodics. She reports while in school in [place] she was seen by a gastroenterologist and underwent upper endoscopy and colonoscopy this was performed on 02/27/2015. Upper endoscopy revealed normal esophagus, mild gastritis and normal duodenum. Biopsies were negative for H. pylori and showed reactive gastropathy. Colonoscopy was normal with biopsies negative for microscopic colitis and negative for IBD.  Shortly after her colonoscopy she developed a diarrheal illness and burning abdominal pain. She was also treated with a Z-Pak by her GI doctor for possible URI. The diarrhea fail to improve and reportedly she tested positive for C. difficile. She was treated with Cipro and metronidazole intended for 10 days. She only tolerated this for 7 days. She reports insomnia and "shaking" from Flagyl. Her diarrhea did improve and she reportedly retested negative for C. difficile on 04/03/2015.  Over the last several days she's had increased frequency of bowel movement and return of her burning midabdominal pain. Bowel movements now 5 times a day not nocturnal. Nonbloody and no melena. She is taking an over-the-counter probiotic    Review of Systems As per history of present illness, otherwise negative  Current Medications, Allergies, Past Medical History, Past Surgical History, Family History and Social History were reviewed in [facility] electronic medical record.     Objective:   Physical Exam BP 122/76 mmHg  Pulse 88  Ht 5' 3.39" (1.61 m)  Wt 185 lb 6 oz (84.086 kg)  BMI 32.44  kg/m2  LMP 04/08/2015 Constitutional: Well-developed and well-nourished. No distress. HEENT: Normocephalic and atraumatic. Oropharynx is clear and moist. No oropharyngeal exudate. Conjunctivae are normal.  No scleral icterus. Neck: Neck supple. Trachea midline. Cardiovascular: Normal rate, regular rhythm and intact distal pulses. No M/R/G Pulmonary/chest: Effort normal and breath sounds normal. No wheezing, rales or rhonchi. Abdominal: Soft, diffusely tender without rebound or guarding, nondistended. Bowel sounds active throughout. There are no masses palpable. No hepatosplenomegaly. Extremities: no clubbing, cyanosis, or edema Neurological: Alert and oriented to person place and time. Skin: Skin is warm and dry. No rashes noted. Psychiatric: Normal mood and affect. Behavior is normal.  EGD and colonoscopy reviewed as per history of present illness     Assessment & Plan:   19 year old female with IBS, anxiety, depression, migraines, and recent C. difficile who seen for follow-up.  1. Recent C. difficile colitis -- felt secondary to azithromycin prescribed after colonoscopy. Now with recurrent loose stools. Rule out C. difficile recurrence. C. difficile PCR ordered stat. Continue daily probiotic  2. Burning abdominal pain -- rule out recurrent C. difficile. Upper endoscopy and colonoscopy unrevealing. If C. difficile negative this is likely a manifestation of IBS. No response to anti-spasmodics. Would like to avoid PPI given C. difficile. Over-the-counter Gaviscon as needed per bottle instruction  3. Anxiety -- chronic and exacerbating #2 above  Return as needed, she will follow with gastroenterologist in [place] when she returns to school

## 2015-05-13 ENCOUNTER — Other Ambulatory Visit: Payer: BLUE CROSS/BLUE SHIELD

## 2015-05-13 DIAGNOSIS — R197 Diarrhea, unspecified: Secondary | ICD-10-CM

## 2015-05-14 LAB — CLOSTRIDIUM DIFFICILE BY PCR: Toxigenic C. Difficile by PCR: NOT DETECTED

## 2015-06-10 ENCOUNTER — Other Ambulatory Visit: Payer: BLUE CROSS/BLUE SHIELD

## 2015-07-01 ENCOUNTER — Encounter: Payer: Self-pay | Admitting: Physician Assistant

## 2015-07-01 ENCOUNTER — Ambulatory Visit (INDEPENDENT_AMBULATORY_CARE_PROVIDER_SITE_OTHER): Payer: BLUE CROSS/BLUE SHIELD | Admitting: Physician Assistant

## 2015-07-01 VITALS — BP 122/80 | HR 100 | Temp 98.4°F | Resp 16 | Ht 63.39 in | Wt 189.0 lb

## 2015-07-01 DIAGNOSIS — J069 Acute upper respiratory infection, unspecified: Secondary | ICD-10-CM | POA: Diagnosis not present

## 2015-07-01 MED ORDER — AZITHROMYCIN 250 MG PO TABS: ORAL_TABLET | ORAL | Status: DC

## 2015-07-01 MED ORDER — MENINGOCOCCAL A C Y&W-135 CONJ IM INJ: 0.5000 mL | INJECTION | Freq: Once | INTRAMUSCULAR | Status: DC

## 2015-07-01 NOTE — Patient Instructions (Signed)
I will give you a prescription for an antibiotic, but please only take it if you are not feeling better in 7-10 days.  Bronchitis is mostly caused by viruses and the antibiotic will do nothing.  PLEASE TRY TO DO OVER THE COUNTER TREATMENT AND/OR PREDNISONE FOR 5-7 DAYS AND IF YOU ARE NOT GETTING BETTER OR GETTING WORSE THEN YOU CAN START ON AN ANTIBIOTIC GIVEN.  Can take the prednisone AT NIGHT WITH DINNER, it take 8-12 hours to start working so it will NOT affect your sleeping if you take it at night with your food!! Take two pills the first night and 1 or two pill the second night and then 1 pill the other nights.    Rest and stay hydrated.  Make sure you drink plenty of fluids to make sure urine is clear when you urinate.  Water will help thin out mucous. - Take Mucinex DM- Maximum Strength over the counter to thin out and cough up the thick mucous.  Please follow directions on box. -Take Albuterol if prescribed.  Risk of antibiotic use: About 1 in 4 people who take antibiotics have side effects including stomach problems, dizziness, or rashes. Those problems clear up soon after stopping the drugs, but in rare cases antibiotics can cause severe allergic reaction. Over use of antibiotics also encourages the growth of bacteria that can't be controlled easily with drugs. That makes you more vunerable to antibiotic-resistant infections and undermines the benefits of antibiotics for others.   Waste of Money: Antibiotics often aren't very expensive, but any money spent on unnecessary drugs is money down the drain.   When are antibiotics needed? Only when symptoms last longer than a week.  Start to improve but then worsen again  Please call the office or message through My Chart if you have any questions.   Acute Bronchitis Bronchitis is when the airways that extend from the windpipe into the lungs get red, puffy, and painful (inflamed). Bronchitis often causes thick spit (mucus) to develop. This  leads to a cough. A cough is the most common symptom of bronchitis. In acute bronchitis, the condition usually begins suddenly and goes away over time (usually in 2 weeks). Smoking, allergies, and asthma can make bronchitis worse. Repeated episodes of bronchitis may cause more lung problems.  Most common cause of Bronchitis is viruses (rhinovirus, coronavirus, RSV).  Therefore, not requiring an antibiotic; as antibiotics only treat bacterial infections.  HOME CARE  Rest.  Drink enough fluids to keep your pee (urine) clear or pale yellow (unless you need to limit fluids as told by your doctor).  Only take over-the-counter or prescription medicines as told by your doctor.  Avoid smoking and secondhand smoke. These can make bronchitis worse. If you are a smoker, think about using nicotine gum or skin patches. Quitting smoking will help your lungs heal faster.  Reduce the chance of getting bronchitis again by:  Washing your hands often.  Avoiding people with cold symptoms.  Trying not to touch your hands to your mouth, nose, or eyes.  Follow up with your doctor as told. GET HELP IF: Your symptoms do not improve after 1 week of treatment. Symptoms include:  Cough.  Fever.  Coughing up thick spit.  Body aches.  Chest congestion.  Chills.  Shortness of breath.  Sore throat. GET HELP RIGHT AWAY IF:   You have an increased fever.  You have chills.  You have severe shortness of breath.  You have bloody thick spit (sputum).    You throw up (vomit) often.  You lose too much body fluid (dehydration).  You have a severe headache.  You faint. MAKE SURE YOU:   Understand these instructions.  Will watch your condition.  Will get help right away if you are not doing well or get worse. Document Released: 11/23/2007 Document Revised: 02/06/2013 Document Reviewed: 11/27/2012 ExitCare Patient Information 2015 [employer]. This information is not intended to replace  advice given to you by your health care provider. Make sure you discuss any questions you have with your health care provider.   

## 2015-07-01 NOTE — Progress Notes (Signed)
   Subjective:    Patient ID: Lori West, female    DOB: 04/08/1996, 20 y.o.   MRN: [id number]  HPI 20 y.o. WF with history of asthma presents with chest congestion x 2 weeks. Has been taking sudafed, tylenol cold, states her sinuses are better but continues with cough, green mucus, without SOB, wheezing mild chest pain with cough. Denies fever or chills.   Blood pressure 122/80, pulse 100, temperature 98.4 F (36.9 C), temperature source Temporal, resp. rate 16, height 5' 3.39" (1.61 m), weight 189 lb (85.73 kg), last menstrual period 07/01/2015, SpO2 99 %.  Past Medical History  Diagnosis Date  . Obesity   . Anxiety   . Vitamin D deficiency   . Headache   . Asthma   . Depression   . C. difficile diarrhea   . IBS (irritable bowel syndrome)   . Gastritis   . Gastropathy     mild reactive   Current Outpatient Prescriptions on File Prior to Visit  Medication Sig Dispense Refill  . Cholecalciferol (VITAMIN D3) 5000 UNITS TABS Take 1 tablet by mouth daily.    Marland Kitchen. PROAIR RESPICLICK 108 (90 BASE) MCG/ACT AEPB Take 2 puffs by mouth as needed.  0  . Probiotic Product (PROBIOTIC PO) Take 1 capsule by mouth 2 (two) times daily.     No current facility-administered medications on file prior to visit.    Review of Systems  Constitutional: Positive for fatigue. Negative for fever and chills.  HENT: Positive for congestion, rhinorrhea, sinus pressure and sore throat. Negative for dental problem, ear discharge, ear pain, nosebleeds, trouble swallowing and voice change.   Respiratory: Positive for cough, chest tightness, shortness of breath and wheezing.   Cardiovascular: Negative.   Gastrointestinal: Negative.   Genitourinary: Negative.   Musculoskeletal: Negative.   Neurological: Negative.        Objective:   Physical Exam  Constitutional: She is oriented to person, place, and time. She appears well-developed and well-nourished.  HENT:  Head: Normocephalic and atraumatic.   Right Ear: External ear normal.  Left Ear: External ear normal.  Nose: Nose normal.  Mouth/Throat: Oropharynx is clear and moist.  Eyes: Conjunctivae are normal. Pupils are equal, round, and reactive to light.  Neck: Normal range of motion. Neck supple.  Cardiovascular: Normal rate and regular rhythm.   Pulmonary/Chest: Effort normal. No respiratory distress. She has wheezes. She has no rales. She exhibits no tenderness.  Abdominal: Soft. Bowel sounds are normal.  Lymphadenopathy:    She has no cervical adenopathy.  Neurological: She is alert and oriented to person, place, and time.  Skin: Skin is warm and dry.       Assessment & Plan:  URI Feeling better, likely viral dulera BID, albuterol PRN zpak if not better  Has not had meningitis vaccine- sent into to [facility]

## 2015-07-06 ENCOUNTER — Encounter: Payer: Self-pay | Admitting: Allergy and Immunology

## 2015-07-06 ENCOUNTER — Ambulatory Visit (INDEPENDENT_AMBULATORY_CARE_PROVIDER_SITE_OTHER): Payer: BLUE CROSS/BLUE SHIELD | Admitting: Allergy and Immunology

## 2015-07-06 VITALS — BP 122/68 | HR 78 | Temp 97.9°F | Resp 20

## 2015-07-06 DIAGNOSIS — J45901 Unspecified asthma with (acute) exacerbation: Secondary | ICD-10-CM

## 2015-07-06 DIAGNOSIS — J310 Chronic rhinitis: Secondary | ICD-10-CM | POA: Diagnosis not present

## 2015-07-06 DIAGNOSIS — J4530 Mild persistent asthma, uncomplicated: Secondary | ICD-10-CM | POA: Insufficient documentation

## 2015-07-06 MED ORDER — IPRATROPIUM BROMIDE 0.02 % IN SOLN
0.5000 mg | Freq: Once | RESPIRATORY_TRACT | Status: AC
  Administered 2015-07-06: 0.5 mg via RESPIRATORY_TRACT

## 2015-07-06 MED ORDER — ALBUTEROL SULFATE 108 (90 BASE) MCG/ACT IN AEPB: 2.0000 | INHALATION_SPRAY | RESPIRATORY_TRACT | Status: DC | PRN

## 2015-07-06 MED ORDER — LEVALBUTEROL HCL 1.25 MG/3ML IN NEBU
1.2500 mg | INHALATION_SOLUTION | Freq: Once | RESPIRATORY_TRACT | Status: AC
  Administered 2015-07-06: 1.25 mg via RESPIRATORY_TRACT

## 2015-07-06 NOTE — Patient Instructions (Addendum)
Asthma with acute exacerbation  Prednisone has been provided, 20 mg x 4 days, 10 mg x1 day, then stop.  A sample and prescription have been [facility] g, 2 inhalations via spacer device twice a day.  Continue ProAir Respiclick every 4-6 hours as needed.  Lori West has been asked to contact me if her symptoms persist or progress. Otherwise, she may return for follow up in 6 weeks.  Chronic rhinitis  The patient is scheduled to return in the near future for allergy skin testing after asthma exacerbation has resolved.  Further recommendations will be made at that time based upon skin test results.  For now, restart fluticasone nasal spray, 2 sprays per nostril daily as needed.    Return in about 6 weeks (around 08/17/2015) for allergy skin testing.

## 2015-07-06 NOTE — Progress Notes (Signed)
Follow-up Note  RE: Lori LeekShelby C West MRN: [id number] DOB: 10/11/1995 Date of Office Visit: 07/06/2015  Primary care provider: Nadean CorwinMCKEOWN,WILLIAM DAVID, MD Referring provider: Lucky CowboyMcKeown, William, MD  History of present illness: HPI Comments: Lori BaileyShelby West is a 20 y.o. female with asthma and rhinoconjunctivitis who presents today for follow up.  She was last seen in this office on 01/21/2015.  She reports that her asthma symptoms had initially improved significantly while using Dulera, however she discontinued this medication after the sample ran out and did not pick up the prescription.  She reports that over the past few weeks she has experienced coughing, dyspnea, and wheezing.  Over the past few she has experienced over respiratory symptoms every hour while awake.  She denies nocturnal awakenings due to lower respiratory symptoms.  She has not experienced fevers, chills, or discolored mucus production.  She reports that up until this past week she had experienced some nasal congestion and rhinorrhea, however currently she has no nasal symptom complaints.   Assessment and plan: Asthma with acute exacerbation  Prednisone has been provided, 20 mg x 4 days, 10 mg x1 day, then stop.  A sample and prescription have been [facility] g, 2 inhalations via spacer device twice a day.  Continue ProAir Respiclick every 4-6 hours as needed.  Lori DavenportShelby has been asked to contact me if her symptoms persist or progress. Otherwise, she may return for follow up in 6 weeks.  Chronic rhinitis  The patient is scheduled to return in the near future for allergy skin testing after asthma exacerbation has resolved.  Further recommendations will be made at that time based upon skin test results.  For now, restart fluticasone nasal spray, 2 sprays per nostril daily as needed.    Meds ordered this encounter  Medications  . ipratropium (ATROVENT) nebulizer solution 0.5 mg    Sig:   . levalbuterol  (XOPENEX) nebulizer solution 1.25 mg    Sig:   . Albuterol Sulfate (PROAIR RESPICLICK) 108 (90 Base) MCG/ACT AEPB    Sig: Inhale 2 puffs into the lungs every 4 (four) hours as needed.    Dispense:  1 each    Refill:  1    Diagnositics: Spirometry reveals an FVC of 3.08 L and an FEV1 of 2.74 L (88% predicted) without post bronchodilator improvement.     Physical examination: Blood pressure 122/68, pulse 78, temperature 97.9 F (36.6 C), resp. rate 20, last menstrual period 07/01/2015.  General: Alert, interactive, in no acute distress. HEENT: TMs pearly gray, turbinates moderately edematous without discharge, post-pharynx mildly erythematous. Neck: Supple without lymphadenopathy. Lungs: Mildly decreased breath sounds bilaterally without wheezing, rhonchi or rales. CV: Normal S1, S2 without murmurs. Skin: Warm and dry, without lesions or rashes.  The following portions of the patient's history were reviewed and updated as appropriate: allergies, current medications, past family history, past medical history, past social history, past surgical history and problem list.    Medication List       This list is accurate as of: 07/06/15  6:00 PM.  Always use your most recent med list.               DULERA 200-5 MCG/ACT Aero  Generic drug:  mometasone-formoterol  Inhale 2 puffs into the lungs 2 (two) times daily.     meningococcal polysaccharide injection  Commonly known as:  MENACTRA  Inject 0.5 mLs into the muscle once.     PROAIR RESPICLICK 108 (90 Base) MCG/ACT Aepb  Generic drug:  Albuterol Sulfate  Take 2 puffs by mouth as needed.     Albuterol Sulfate 108 (90 Base) MCG/ACT Aepb  Commonly known as:  PROAIR RESPICLICK  Inhale 2 puffs into the lungs every 4 (four) hours as needed.     PROBIOTIC PO  Take 1 capsule by mouth 2 (two) times daily.     Vitamin D3 5000 units Tabs  Take 1 tablet by mouth daily.        Allergies  Allergen Reactions  . Peanut-Containing  Drug Products Other (See Comments)    Tingling and numbness in throat from tree nuts  . Alcohol-Sulfur [Sulfur]   . Lactose Intolerance (Gi)     Not a true allergy- causes acne breakouts   Review of systems: Constitutional: Negative for fever, chills and weight loss.  HENT: Negative for nosebleeds.   Eyes: Negative for blurred vision.  Respiratory: Negative for hemoptysis.   Positive for coughing, dyspnea, wheezing. Cardiovascular: Negative for chest pain.  Gastrointestinal: Negative for diarrhea and constipation.  Genitourinary: Negative for dysuria.  Musculoskeletal: Negative for myalgias and joint pain.  Neurological: Negative for dizziness.  Endo/Heme/Allergies: Does not bruise/bleed easily.   Past Medical History  Diagnosis Date  . Obesity   . Anxiety   . Vitamin D deficiency   . Headache   . Asthma   . Depression   . C. difficile diarrhea   . IBS (irritable bowel syndrome)   . Gastritis   . Gastropathy     mild reactive    Family History  Problem Relation Age of Onset  . Heart disease Maternal Aunt   . Parkinsonism Maternal Grandfather   . Migraines Mother   . Skin cancer Maternal Aunt   . Diabetes Maternal Grandmother     Social History   Social History  . Marital Status: Single    Spouse Name: N/A  . Number of Children: 0  . Years of Education: N/A   Occupational History  . Student     Social History Main Topics  . Smoking status: Never Smoker   . Smokeless tobacco: Never Used  . Alcohol Use: No  . Drug Use: No  . Sexual Activity: Yes    Birth Control/ Protection: IUD   Other Topics Concern  . Not on file   Social History Narrative   3 cups of tea a day     I appreciate the opportunity to take part in this [facility] care. Please do not hesitate to contact me with questions.  Sincerely,   R. Jorene Guest, MD

## 2015-07-06 NOTE — Assessment & Plan Note (Addendum)
   Prednisone has been provided, 20 mg x 4 days, 10 mg x1 day, then stop.  A sample and prescription have been provided for [facility]/5 g, 2 inhalations via spacer device twice a day.  Continue ProAir Respiclick every 4-6 hours as needed.  Lori DavenportShelby has been asked to contact me if her symptoms persist or progress. Otherwise, she may return for follow up in 6 weeks.

## 2015-07-06 NOTE — Assessment & Plan Note (Addendum)
   The patient is scheduled to return in the near future for allergy skin testing after asthma exacerbation has resolved.  Further recommendations will be made at that time based upon skin test results.  For now, restart fluticasone nasal spray, 2 sprays per nostril daily as needed.

## 2015-07-07 ENCOUNTER — Other Ambulatory Visit: Payer: Self-pay

## 2015-09-16 ENCOUNTER — Ambulatory Visit (INDEPENDENT_AMBULATORY_CARE_PROVIDER_SITE_OTHER): Payer: BLUE CROSS/BLUE SHIELD | Admitting: Internal Medicine

## 2015-09-16 ENCOUNTER — Encounter: Payer: Self-pay | Admitting: Internal Medicine

## 2015-09-16 VITALS — BP 122/82 | HR 72 | Temp 97.9°F | Resp 16 | Ht 62.25 in | Wt 192.0 lb

## 2015-09-16 DIAGNOSIS — S161XXA Strain of muscle, fascia and tendon at neck level, initial encounter: Secondary | ICD-10-CM | POA: Diagnosis not present

## 2015-09-16 MED ORDER — PREDNISONE 10 MG PO TABS: ORAL_TABLET | ORAL | Status: DC

## 2015-09-16 MED ORDER — CYCLOBENZAPRINE HCL 10 MG PO TABS: ORAL_TABLET | ORAL | Status: DC

## 2015-09-16 NOTE — Patient Instructions (Signed)

## 2015-09-16 NOTE — Progress Notes (Signed)
  Subjective:    Patient ID: Lori LeekShelby C West, female    DOB: 12/19/1995, 20 y.o.   MRN: [id number]  HPI  Patient presents with a 4-5 day hx/o neck (occiput) & upper back/shoulder pains precipitated after working -out for a triathlon. Denies focal neuro signs/sx's.   Medication Sig  . Albuterol /  PROAIR RESPICLICK Inhale 2 puffs into the lungs every 4 (four) hours as needed.  Marland Kitchen. VITAMIN D3 5000 UNITS TABS Take 1 tablet by mouth daily.  . Probiotic Product  Take 1 capsule by mouth 2 (two) times daily.  . citalopram  10 MG tablet Reported on 09/16/2015  . DULERA  200-5  Inhale 2 puffs into the lungs 2 (two) times daily. Reported on 09/16/2015  . PROAIR RESPICLICK  Take 2 puffs by mouth as needed.   Allergies  Allergen Reactions  . Peanut-Containing Drug Products Other (See Comments)    Tingling and numbness in throat from tree nuts  . Alcohol-Sulfur [Sulfur]   . Lactose Intolerance (Gi)     Not a true allergy- causes acne breakouts   Past Medical History  Diagnosis Date  . Obesity   . Anxiety   . Vitamin D deficiency   . Headache   . Asthma   . Depression   . C. difficile diarrhea   . IBS (irritable bowel syndrome)   . Gastritis   . Gastropathy     mild reactive   Review of Systems  10 point systems review negative except as above.    Objective:   Physical Exam  BP 122/82 mmHg  Pulse 72  Temp(Src) 97.9 F (36.6 C)  Resp 16  Ht 5' 2.25" (1.581 m)  Wt 192 lb (87.091 kg)  BMI 34.84 kg/m2  HEENT - Eac's patent. TM's Nl. EOM's full. PERRLA. NasoOroPharynx clear. Neck -  Nl Thyroid. Carotids 2+ & No bruits, nodes, JVD Tender occiput and para-cervical sparm and trapezoid shoulder areas.  Chest - Clear equal BS w/o Rales, rhonchi, wheezes. Cor - Nl HS. RRR w/o sig MGR. PP 1(+). No edema. Abd - No palpable organomegaly, masses or tenderness. BS nl. MS- FROM w/o deformities. Muscle power, tone and bulk Nl. Gait Nl.[place] ROM Nl bilat. Neuro - No obvious Cr N abnormalities.  Sensory, motor and Cerebellar functions appear Nl w/o focal abnormalities, specifically sensorimotor of the UE's is WNL.    Assessment & Plan:   1. Cervical strain, initial encounter  - predniSONE (DELTASONE) 10 MG tablet; 1 tab 3 x day for 2 days, then 1 tab 2 x day for 2 days, then 1 tab 1 x day for 3 days  Dispense: 13 tablet; Refill: 0 - cyclobenzaprine (FLEXERIL) 10 MG tablet; Take 1/2 to 1 tablet 3 x day as needed for muscle spasm  Dispense: 90 tablet; Refill: 0  - Discussed meds/SE's & ROV - prn.

## 2015-10-07 ENCOUNTER — Ambulatory Visit (INDEPENDENT_AMBULATORY_CARE_PROVIDER_SITE_OTHER): Payer: BLUE CROSS/BLUE SHIELD | Admitting: Neurology

## 2015-10-07 ENCOUNTER — Encounter: Payer: Self-pay | Admitting: Neurology

## 2015-10-07 VITALS — BP 123/69 | HR 62 | Ht 62.25 in | Wt 189.4 lb

## 2015-10-07 DIAGNOSIS — H53133 Sudden visual loss, bilateral: Secondary | ICD-10-CM

## 2015-10-07 DIAGNOSIS — L723 Sebaceous cyst: Secondary | ICD-10-CM | POA: Diagnosis not present

## 2015-10-07 NOTE — Patient Instructions (Signed)
Remember to drink plenty of fluid, eat healthy meals and do not skip any meals. Try to eat protein with a every meal and eat a healthy snack such as fruit or nuts in between meals. Try to keep a regular sleep-wake schedule and try to exercise daily, particularly in the form of walking, 20-30 minutes a day, if you can.   As far as diagnostic testing: labs, referral to ophthalmology  I would like to see you back in 6 months, sooner if we need to. Please call us with any interim questions, concerns, problems, updates or refill requests.   Our phone number is [phone]. We also have an after hours call service for urgent matters and there is a physician on-call for urgent questions. For any emergencies you know to call 911 or go to the nearest emergency room
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
## 2015-10-09 ENCOUNTER — Telehealth: Payer: Self-pay | Admitting: Neurology

## 2015-10-09 NOTE — Telephone Encounter (Signed)
Patient is calling because she says she has had the worst migraine since yesterday morning. She has not taken any medication for the migraine. Please call to discuss.

## 2015-10-09 NOTE — Telephone Encounter (Signed)
Dr Lucia GaskinsAhern- FYI Called pt back. She states she does not have cambia with her. She is in [place]. Cannot take ibuprofen d/t her stomach issues. She is coming back Monday from [place]. She has terrible allergies. She called PCP already who advised they think it was her allergies. She c/o congestion, head pressure, neck pain. I advised per Dr Lucia GaskinsAhern that she can call her in a triptan medication that is commonly used to treat migraine headaches and are used as rescue medications. I went over imitrex and common SE.  She declined at this time. She states she has not taken any allergy medications yet and feels congested. She was to try OTC allergy/sinus medication first to see if this will help relieve sx. Advised her to call back next week if sx persist. She verbalized understanding.

## 2015-10-13 ENCOUNTER — Encounter: Payer: Self-pay | Admitting: Physician Assistant

## 2015-10-13 ENCOUNTER — Ambulatory Visit (INDEPENDENT_AMBULATORY_CARE_PROVIDER_SITE_OTHER): Payer: BLUE CROSS/BLUE SHIELD | Admitting: Physician Assistant

## 2015-10-13 VITALS — BP 126/74 | HR 93 | Temp 97.3°F | Resp 16 | Ht 62.25 in | Wt 189.4 lb

## 2015-10-13 DIAGNOSIS — J0101 Acute recurrent maxillary sinusitis: Secondary | ICD-10-CM | POA: Diagnosis not present

## 2015-10-13 MED ORDER — DEXAMETHASONE 0.5 MG PO TABS
ORAL_TABLET | ORAL | Status: DC
Start: 2015-10-13 — End: 2015-11-10

## 2015-10-13 MED ORDER — AZITHROMYCIN 250 MG PO TABS: ORAL_TABLET | ORAL | Status: DC

## 2015-10-13 NOTE — Patient Instructions (Signed)
Please take the decadron to help decrease inflammation and therefore decrease symptoms. Take it it with food to avoid GI upset. It can cause increased energy but on the other hand it can make it hard to sleep at night so please take it AT NIGHT WITH DINNER, it takes 8-12 hours to start working so it will NOT affect your sleeping if you take it at night with your food!!   STAY ON FLONASE GET ON ALLEGRA TAKE ZPAK IF NOT BETTER  HOW TO TREAT VIRAL COUGH AND COLD SYMPTOMS:  -Symptoms usually last at least 1 week with the worst symptoms being around day 4.  - colds usually start with a sore throat and end with a cough, and the cough can take 2 weeks to get better.  -No antibiotics are needed for colds, flu, sore throats, cough, bronchitis UNLESS symptoms are longer than 7 days OR if you are getting better then get drastically worse.  -There are a lot of combination medications (Dayquil, Nyquil, Vicks 44, tyelnol cold and sinus, ETC). Please look at the ingredients on the back so that you are treating the correct symptoms and not doubling up on medications/ingredients.    Medicines you can use  Nasal congestion  - pseudoephedrine (Sudafed)- behind the counter, do not use if you have high blood pressure, medicine that have -D in them.  - phenylephrine (Sudafed PE) -Dextormethorphan + chlorpheniramine (Coridcidin HBP)- okay if you have high blood pressure -Oxymetazoline (Afrin) nasal spray- LIMIT to 3 days -Saline nasal spray -Neti pot (used distilled or bottled water)  Ear pain/congestion  -pseudoephedrine (sudafed) - Nasonex/flonase nasal spray  Fever  -Acetaminophen (Tyelnol) -Ibuprofen (Advil, motrin, aleve)  Sore Throat  -Acetaminophen (Tyelnol) -Ibuprofen (Advil, motrin, aleve) -Drink a lot of water -Gargle with salt water - Rest your voice (don't talk) -Throat sprays -Cough drops  Body Aches  -Acetaminophen (Tyelnol) -Ibuprofen (Advil, motrin,  aleve)  Headache  -Acetaminophen (Tyelnol) -Ibuprofen (Advil, motrin, aleve) - Exedrin, Exedrin Migraine  Allergy symptoms (cough, sneeze, runny nose, itchy eyes) -Claritin or loratadine cheapest but likely the weakest  -Zyrtec or certizine at night because it can make you sleepy -The strongest is allegra or fexafinadine  Cheapest at [employer], [employer], [employer]  Cough  -Dextromethorphan (Delsym)- medicine that has DM in it -Guafenesin (Mucinex/Robitussin) - cough drops - drink lots of water  Chest Congestion  -Guafenesin (Mucinex/Robitussin)  Red Itchy Eyes  - Naphcon-A  Upset Stomach  - Bland diet (nothing spicy, greasy, fried, and high acid foods like tomatoes, oranges, berries) -OKAY- cereal, bread, soup, crackers, rice -Eat smaller more frequent meals -reduce caffeine, no alcohol -Loperamide (Imodium-AD) if diarrhea -Prevacid for heart burn  General health when sick  -Hydration -wash your hands frequently -keep surfaces clean -change pillow cases and sheets often -Get fresh air but do not exercise strenuously -Vitamin D, double up on it - Vitamin C -Zinc

## 2015-10-13 NOTE — Progress Notes (Signed)
Subjective:    Patient ID: Lori West, female    DOB: 02/22/1996, 20 y.o.   MRN: [id number]  HPI 20 y.o. WF presents with sinus pain x 1 week getting worse. Has teeth/facial pain, sinus congestion. No fever, chills. Has tried sudafed, flonase, no allergy pill.   Blood pressure 126/74, pulse 93, temperature 97.3 F (36.3 C), temperature source Temporal, resp. rate 16, height 5' 2.25" (1.581 m), weight 189 lb 6.4 oz (85.911 kg), SpO2 97 %.  Past Medical History  Diagnosis Date  . Obesity   . Anxiety   . Vitamin D deficiency   . Headache   . Asthma   . Depression   . C. difficile diarrhea   . IBS (irritable bowel syndrome)   . Gastritis   . Gastropathy     mild reactive   Current Outpatient Prescriptions on File Prior to Visit  Medication Sig Dispense Refill  . Albuterol Sulfate (PROAIR RESPICLICK) 108 (90 Base) MCG/ACT AEPB Inhale 2 puffs into the lungs every 4 (four) hours as needed. 1 each 1  . Cholecalciferol (VITAMIN D3) 5000 UNITS TABS Take 1 tablet by mouth daily.    Marland Kitchen meningococcal polysaccharide (MENACTRA) injection Inject 0.5 mLs into the muscle once. 0.5 mL 0  . Probiotic Product (PROBIOTIC PO) Take 1 capsule by mouth 2 (two) times daily.     No current facility-administered medications on file prior to visit.   Review of Systems  Constitutional: Positive for fatigue. Negative for fever and chills.  HENT: Positive for congestion, rhinorrhea, sinus pressure and sore throat. Negative for dental problem, ear discharge, ear pain, nosebleeds, trouble swallowing and voice change.   Respiratory: Positive for cough. Negative for chest tightness, shortness of breath and wheezing.   Cardiovascular: Negative.   Gastrointestinal: Negative.   Genitourinary: Negative.   Musculoskeletal: Negative.   Neurological: Negative.        Objective:   Physical Exam  Constitutional: She is oriented to person, place, and time. She appears well-developed and well-nourished.   HENT:  Right Ear: Hearing and external ear normal. No mastoid tenderness. Tympanic membrane is injected. Tympanic membrane is not perforated, not erythematous, not retracted and not bulging. A middle ear effusion is present.  Left Ear: Hearing and external ear normal. No mastoid tenderness. Tympanic membrane is injected. Tympanic membrane is not perforated, not erythematous, not retracted and not bulging. A middle ear effusion is present.  Nose: Right sinus exhibits maxillary sinus tenderness. Left sinus exhibits maxillary sinus tenderness.  Mouth/Throat: Uvula is midline, oropharynx is clear and moist and mucous membranes are normal.  Eyes: Conjunctivae and EOM are normal. Pupils are equal, round, and reactive to light.  Neck: Neck supple.  Cardiovascular: Normal rate and regular rhythm.   Pulmonary/Chest: Effort normal and breath sounds normal. No respiratory distress. She has no wheezes.  Abdominal: Soft. Bowel sounds are normal.  Musculoskeletal: Normal range of motion.  Lymphadenopathy:    She has no cervical adenopathy.  Neurological: She is alert and oriented to person, place, and time.  Skin: Skin is warm and dry.      Assessment & Plan:  1. Acute recurrent maxillary sinusitis [J01.01] Will hold the zpak and take if she is not getting better, increase fluids, rest, cont allergy pill - azithromycin (ZITHROMAX) 250 MG tablet; Two tablets day one, then one tablet daily next 4 days.  Dispense: 6 tablet; Refill: 0 - dexamethasone (DECADRON) 0.5 MG tablet; Take 1 tablet PO daily for 3-5  days  Dispense: 5 tablet; Refill: 0

## 2015-10-27 ENCOUNTER — Encounter: Payer: Self-pay | Admitting: Allergy and Immunology

## 2015-10-27 ENCOUNTER — Ambulatory Visit (INDEPENDENT_AMBULATORY_CARE_PROVIDER_SITE_OTHER): Payer: BLUE CROSS/BLUE SHIELD | Admitting: Allergy and Immunology

## 2015-10-27 VITALS — BP 118/76 | HR 76 | Temp 98.2°F | Resp 18

## 2015-10-27 DIAGNOSIS — J310 Chronic rhinitis: Secondary | ICD-10-CM

## 2015-10-27 DIAGNOSIS — D485 Neoplasm of uncertain behavior of skin: Secondary | ICD-10-CM | POA: Diagnosis not present

## 2015-10-27 DIAGNOSIS — J4531 Mild persistent asthma with (acute) exacerbation: Secondary | ICD-10-CM | POA: Diagnosis not present

## 2015-10-27 MED ORDER — AZELASTINE HCL 0.15 % NA SOLN: NASAL | Status: DC

## 2015-10-27 MED ORDER — LEVALBUTEROL TARTRATE 45 MCG/ACT IN AERO: INHALATION_SPRAY | RESPIRATORY_TRACT | Status: DC

## 2015-10-27 MED ORDER — SPACER/AERO-HOLDING CHAMBERS DEVI: Status: DC

## 2015-10-27 MED ORDER — FLUTICASONE FUROATE-VILANTEROL 200-25 MCG/INH IN AEPB: 1.0000 | INHALATION_SPRAY | Freq: Every day | RESPIRATORY_TRACT | Status: DC

## 2015-10-27 NOTE — Progress Notes (Signed)
Follow-up Note  RE: Lori West MRN: [id number] DOB: 10/22/1995 Date of Office Visit: 10/27/2015  Primary care provider: Nadean Corwin, MD Referring provider: Lucky Cowboy, MD  History of present illness: HPI Comments: Lori West is a 20 y.o. female with asthma and rhinoconjunctivitis who presents today for a sick visit. She was last seen in this office on July 06, 2015. Training for a 10k. Training indoors to avoid the pollen. Within minutes after exercise - feels like shes going to "cough up my lungs", dyspnea, chest tightness. Does not take albuterol before or after unless she feels her symptoms are so bad that she is "going to die" because her albuterol HFA inhaler causes shaky hands and racing heart.  She experiences occasional chest tightness at bedtime but does not experience nocturnal awakenings due to lower respiratory symptoms.  She had a full cardiac workup in November 2016 which was negative.  She currently is not using a controller medication because she tried Dulera and montelukast without adequate symptom relief.  She has also been experiencing nasal congestion.  She was recently prescribed fluticasone nasal spray with inadequate relief.   Assessment and plan: Mild persistent asthma  A prescription has been provided for Breo Ellipta (fluticasone furoate/vilanterol) 200/25 g, one inhalation daily.  A prescription has been provided for Xopenex HFA, 1-2 inhalations via spacer device every 4-6 hours as needed and 15 minutes prior to exercise.  Subjective and objective measures of pulmonary function will be followed and the treatment plan will be adjusted accordingly.  Chronic rhinitis  A sample and prescription have been provided for Dymista (azelastine/fluticasone) nasal spray, 1 spray per nostril twice daily as needed. Proper nasal spray technique has been discussed and demonstrated.  I have also recommended nasal saline spray (i.e., Simply Saline)  or nasal saline lavage (i.e., NeilMed) as needed prior to medicated nasal sprays.    Meds ordered this encounter  Medications  . fluticasone furoate-vilanterol (BREO ELLIPTA) 200-25 MCG/INH AEPB    Sig: Inhale 1 puff into the lungs daily.    Dispense:  30 each    Refill:  3  . levalbuterol (XOPENEX HFA) 45 MCG/ACT inhaler    Sig: 1 to 2 inhalations via spacer every 4 to 6 hours as needed and 15 minutes prior to exercise.    Dispense:  1 Inhaler    Refill:  1  . Spacer/Aero-Holding Chambers DEVI    Sig: Use as directed with MDI    Dispense:  1 each    Refill:  2  . Azelastine HCl 0.15 % SOLN    Sig: Place 1 spray into each nostril twice daily as needed.    Dispense:  30 mL    Refill:  5    Diagnositics: Spirometry reveals an FVC of 3.08 L and an FEV1 of 2.63 L (85% predicted) with 280 mL postbronchodilator improvement.  Please see scanned spirometry results for details.    Physical examination: Blood pressure 118/76, pulse 76, temperature 98.2 F (36.8 C), temperature source Oral, resp. rate 18.  General: Alert, interactive, in no acute distress. HEENT: TMs pearly gray, turbinates edematous without discharge, post-pharynx moderately erythematous. Neck: Supple without lymphadenopathy. Lungs: Clear to auscultation without wheezing, rhonchi or rales. CV: Normal S1, S2 without murmurs. Skin: Warm and dry, without lesions or rashes.  The following portions of the patient's history were reviewed and updated as appropriate: allergies, current medications, past family history, past medical history, past social history, past surgical history and problem list.  Medication List       This list is accurate as of: 10/27/15  6:58 PM.  Always use your most recent med list.               Albuterol Sulfate 108 (90 Base) MCG/ACT Aepb  Commonly known as:  PROAIR RESPICLICK  Inhale 2 puffs into the lungs every 4 (four) hours as needed.     Azelastine HCl 0.15 % Soln  Place 1 spray  into each nostril twice daily as needed.     azithromycin 250 MG tablet  Commonly known as:  ZITHROMAX  Two tablets day one, then one tablet daily next 4 days.     dexamethasone 0.5 MG tablet  Commonly known as:  DECADRON  Take 1 tablet PO daily for 3-5  days     fluticasone furoate-vilanterol 200-25 MCG/INH Aepb  Commonly known as:  BREO ELLIPTA  Inhale 1 puff into the lungs daily.     levalbuterol 45 MCG/ACT inhaler  Commonly known as:  XOPENEX HFA  1 to 2 inhalations via spacer every 4 to 6 hours as needed and 15 minutes prior to exercise.     meningococcal polysaccharide injection  Commonly known as:  MENACTRA  Inject 0.5 mLs into the muscle once.     PROBIOTIC PO  Take 1 capsule by mouth 2 (two) times daily.     Spacer/Aero-Holding [employer] Devi  Use as directed with MDI     Vitamin D3 5000 units Tabs  Take 1 tablet by mouth daily.        Allergies  Allergen Reactions  . Peanut-Containing Drug Products Other (See Comments)    Tingling and numbness in throat from tree nuts  . Alcohol-Sulfur [Sulfur]   . Lactose Intolerance (Gi)     Not a true allergy- causes acne breakouts    I appreciate the opportunity to take part in this [facility]. Please do not hesitate to contact me with questions.  Sincerely,   R. Jorene Guestarter Dejane Scheibe, MD

## 2015-10-27 NOTE — Patient Instructions (Addendum)
Mild persistent asthma with mild exacerbation  A prescription has been provided for Breo Ellipta (fluticasone furoate/vilanterol) 200/25 g, one inhalation daily.  A prescription has been provided for Xopenex HFA, 1-2 inhalations via spacer device every 4-6 hours as needed and 15 minutes prior to exercise.  Subjective and objective measures of pulmonary function will be followed and the treatment plan will be adjusted accordingly.  Chronic rhinitis  A sample and prescription have been provided for Dymista (azelastine/fluticasone) nasal spray, 1 spray per nostril twice daily as needed. Proper nasal spray technique has been discussed and demonstrated.  I have also recommended nasal saline spray (i.e., Simply Saline) or nasal saline lavage (i.e., NeilMed) as needed prior to medicated nasal sprays.    Return in about 6 weeks (around 12/08/2015), or if symptoms worsen or fail to improve.

## 2015-10-27 NOTE — Assessment & Plan Note (Signed)
   A sample and prescription have been provided for Dymista (azelastine/fluticasone) nasal spray, 1 spray per nostril twice daily as needed. Proper nasal spray technique has been discussed and demonstrated.  I have also recommended nasal saline spray (i.e., Simply Saline) or nasal saline lavage (i.e., NeilMed) as needed prior to medicated nasal sprays.

## 2015-10-27 NOTE — Assessment & Plan Note (Signed)
   A prescription has been provided for Breo Ellipta (fluticasone furoate/vilanterol) 200/25 g, one inhalation daily.  A prescription has been provided for Xopenex HFA, 1-2 inhalations via spacer device every 4-6 hours as needed and 15 minutes prior to exercise.  Subjective and objective measures of pulmonary function will be followed and the treatment plan will be adjusted accordingly.

## 2015-10-28 DIAGNOSIS — G43909 Migraine, unspecified, not intractable, without status migrainosus: Secondary | ICD-10-CM | POA: Diagnosis not present

## 2015-11-05 ENCOUNTER — Ambulatory Visit: Payer: Self-pay | Admitting: Internal Medicine

## 2015-11-05 DIAGNOSIS — Z713 Dietary counseling and surveillance: Secondary | ICD-10-CM | POA: Diagnosis not present

## 2015-11-10 ENCOUNTER — Ambulatory Visit (INDEPENDENT_AMBULATORY_CARE_PROVIDER_SITE_OTHER): Payer: BLUE CROSS/BLUE SHIELD | Admitting: Physician Assistant

## 2015-11-10 ENCOUNTER — Encounter: Payer: Self-pay | Admitting: Physician Assistant

## 2015-11-10 VITALS — BP 124/66 | HR 84 | Temp 97.5°F | Resp 16 | Ht 62.25 in | Wt 189.2 lb

## 2015-11-10 DIAGNOSIS — J309 Allergic rhinitis, unspecified: Secondary | ICD-10-CM | POA: Diagnosis not present

## 2015-11-10 NOTE — Progress Notes (Signed)
   Subjective:    Patient ID: Lori West, female    DOB: 01/09/1996, 20 y.o.   MRN: [id number]  HPI 20 y.o. WF presents with decreased hearing left ear x Wednesday. States decreased hearing left ear with popping/clicking, tinnitus, no pain, no fever, chill.  On sudafed, dymista but has not started it yet, allegra D, follows with Dr. Nunzio CobbsBobbitt, allergist.   Blood pressure 124/66, pulse 84, temperature 97.5 F (36.4 C), temperature source Temporal, resp. rate 16, height 5' 2.25" (1.581 m), weight 189 lb 3.2 oz (85.821 kg), SpO2 96 %.   Review of Systems  Constitutional: Positive for fatigue. Negative for fever and chills.  HENT: Positive for congestion, rhinorrhea, sinus pressure and sore throat. Negative for dental problem, ear discharge, ear pain, nosebleeds, trouble swallowing and voice change.   Respiratory: Positive for cough. Negative for chest tightness, shortness of breath and wheezing.   Cardiovascular: Negative.   Gastrointestinal: Negative.   Genitourinary: Negative.   Musculoskeletal: Negative.   Neurological: Negative.        Objective:   Physical Exam  Constitutional: She is oriented to person, place, and time. She appears well-developed and well-nourished.  HENT:  Right Ear: Hearing and external ear normal. No mastoid tenderness. Tympanic membrane is injected. Tympanic membrane is not perforated, not erythematous, not retracted and not bulging. A middle ear effusion is present.  Left Ear: Hearing and external ear normal. No mastoid tenderness. Tympanic membrane is injected. Tympanic membrane is not perforated, not erythematous, not retracted and not bulging. A middle ear effusion is present.  Nose: Right sinus exhibits no maxillary sinus tenderness. Left sinus exhibits no maxillary sinus tenderness.  Mouth/Throat: Uvula is midline, oropharynx is clear and moist and mucous membranes are normal.  Eyes: Conjunctivae and EOM are normal. Pupils are equal, round, and  reactive to light.  Neck: Neck supple.  Cardiovascular: Normal rate and regular rhythm.   Pulmonary/Chest: Effort normal and breath sounds normal. No respiratory distress. She has no wheezes.  Abdominal: Soft. Bowel sounds are normal.  Musculoskeletal: Normal range of motion.  Lymphadenopathy:    She has no cervical adenopathy.  Neurological: She is alert and oriented to person, place, and time.  Skin: Skin is warm and dry.       Assessment & Plan:  1. Allergic rhinitis, unspecified allergic rhinitis type With effusion, add dymista, auto inflation taught, if hearing not better will refer to ENT for evaluatoin

## 2015-11-10 NOTE — Patient Instructions (Signed)
Your ears and sinuses are connected by the eustachian tube. When your sinuses are inflamed, this can close off the tube and cause fluid to collect in your middle ear. This can then cause dizziness, popping, clicking, ringing, and echoing in your ears. This is often NOT an infection and does NOT require antibiotics, it is caused by inflammation so the treatments help the inflammation. This can take a long time to get better so please be patient.  Here are things you can do to help with this: -  Remember to spray each nostril twice towards the outer part of your eye.  Do not sniff but instead pinch your nose and tilt your head back to help the medicine get into your sinuses.  The best time to do this is at bedtime.Stop if you get blurred vision or nose bleeds.  -While drinking fluids, pinch and hold nose close and swallow, to help open eustachian tubes to drain fluid behind ear drums. -Please pick one of the over the counter allergy medications below and take it once daily for allergies.  It will also help with fluid behind ear drums. Claritin or loratadine cheapest but likely the weakest  Zyrtec or certizine at night because it can make you sleepy The strongest is allegra or fexafinadine  Cheapest at [employer], [employer], [employer] -can use decongestant over the counter, please do not use if you have high blood pressure or certain heart conditions.   if worsening HA, changes vision/speech, imbalance, weakness go to the ER  Get on sudafed with allergy pill for 2 weeks, do dymista, if your hearing is not better in 4 weeks will send to ENT for evaluation for possible tubes

## 2015-11-11 DIAGNOSIS — Z30431 Encounter for routine checking of intrauterine contraceptive device: Secondary | ICD-10-CM | POA: Diagnosis not present

## 2015-11-11 DIAGNOSIS — Z01419 Encounter for gynecological examination (general) (routine) without abnormal findings: Secondary | ICD-10-CM | POA: Diagnosis not present

## 2015-11-11 DIAGNOSIS — Z6832 Body mass index (BMI) 32.0-32.9, adult: Secondary | ICD-10-CM | POA: Diagnosis not present

## 2015-12-02 DIAGNOSIS — N6002 Solitary cyst of left breast: Secondary | ICD-10-CM | POA: Diagnosis not present

## 2015-12-11 DIAGNOSIS — Z713 Dietary counseling and surveillance: Secondary | ICD-10-CM | POA: Diagnosis not present

## 2015-12-11 DIAGNOSIS — L910 Hypertrophic scar: Secondary | ICD-10-CM | POA: Diagnosis not present

## 2015-12-23 IMAGING — CR DG ABDOMEN 2V
3 series · 3 of 3 positions shown · non-contrast
Comparison: None.

CLINICAL DATA: Mid abdominal pain, right abdominal pain for 4 days.
Constipation.

EXAM:
ABDOMEN - 2 VIEW

[view not recorded (1 of 3)]
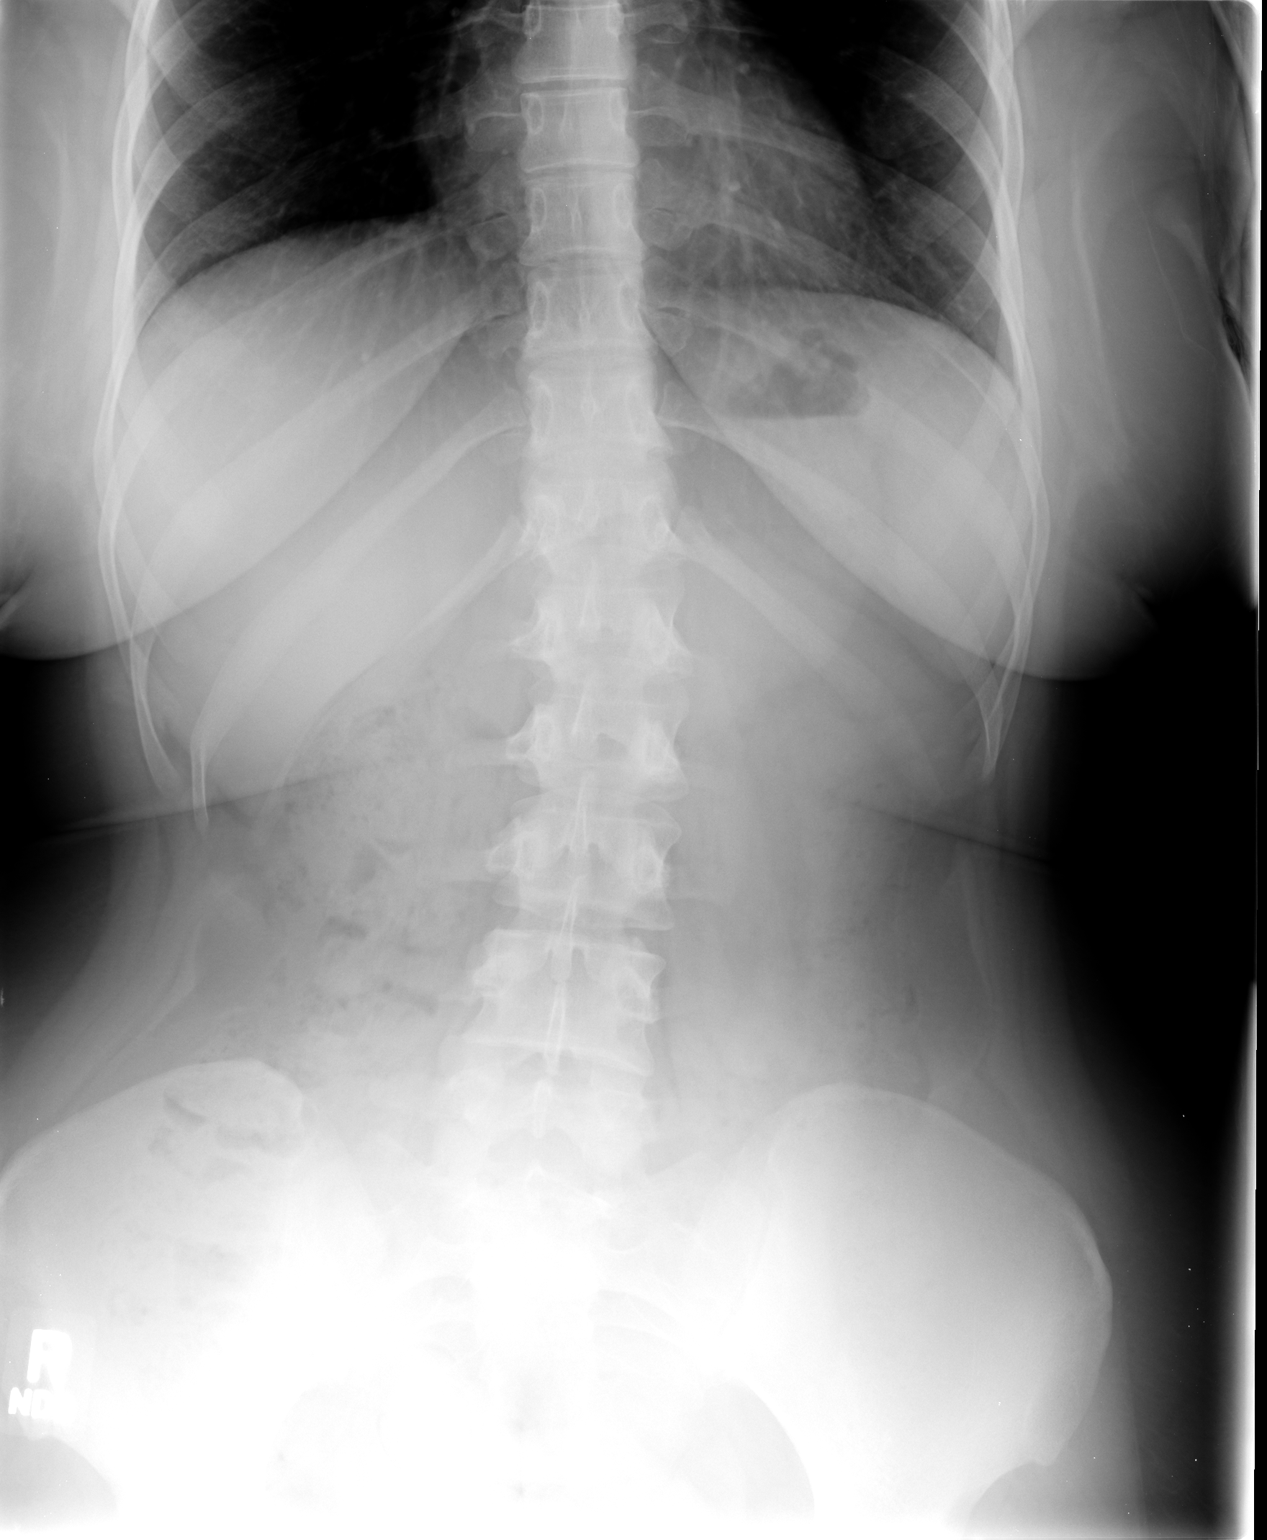

[view not recorded (2 of 3)]
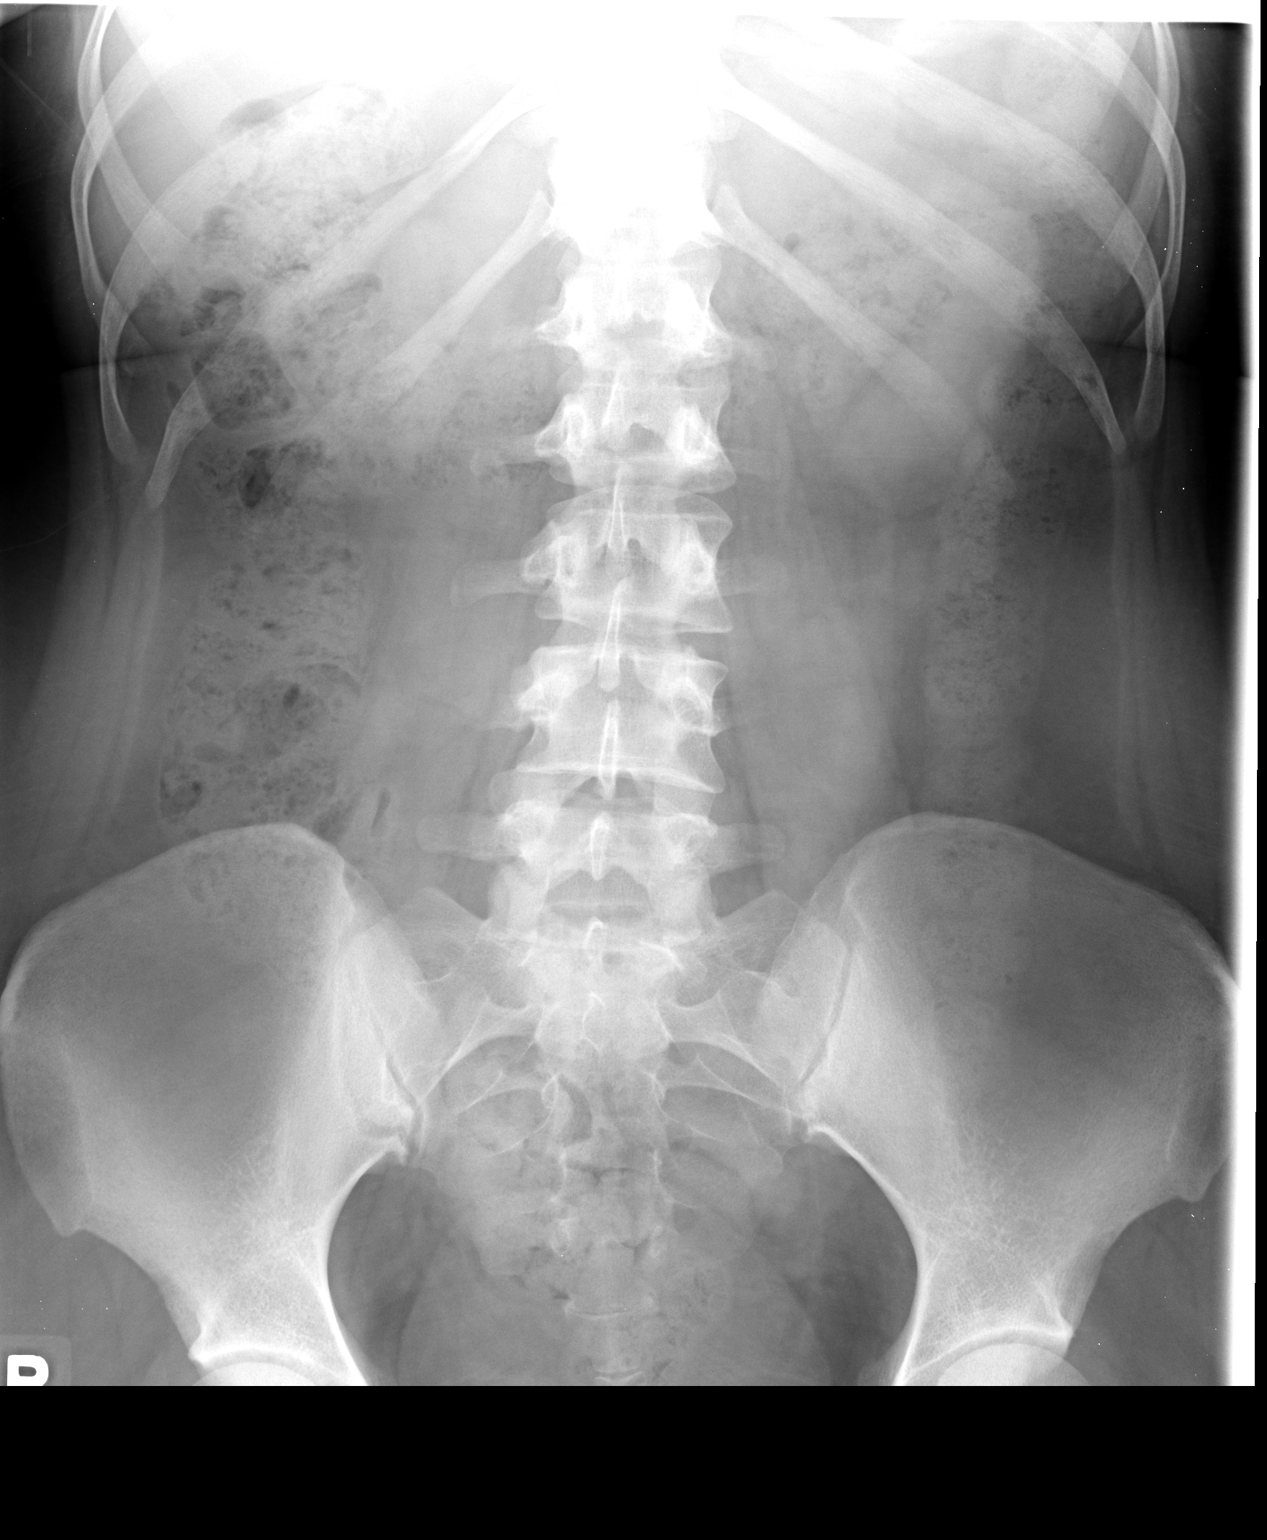

[view not recorded (3 of 3)]
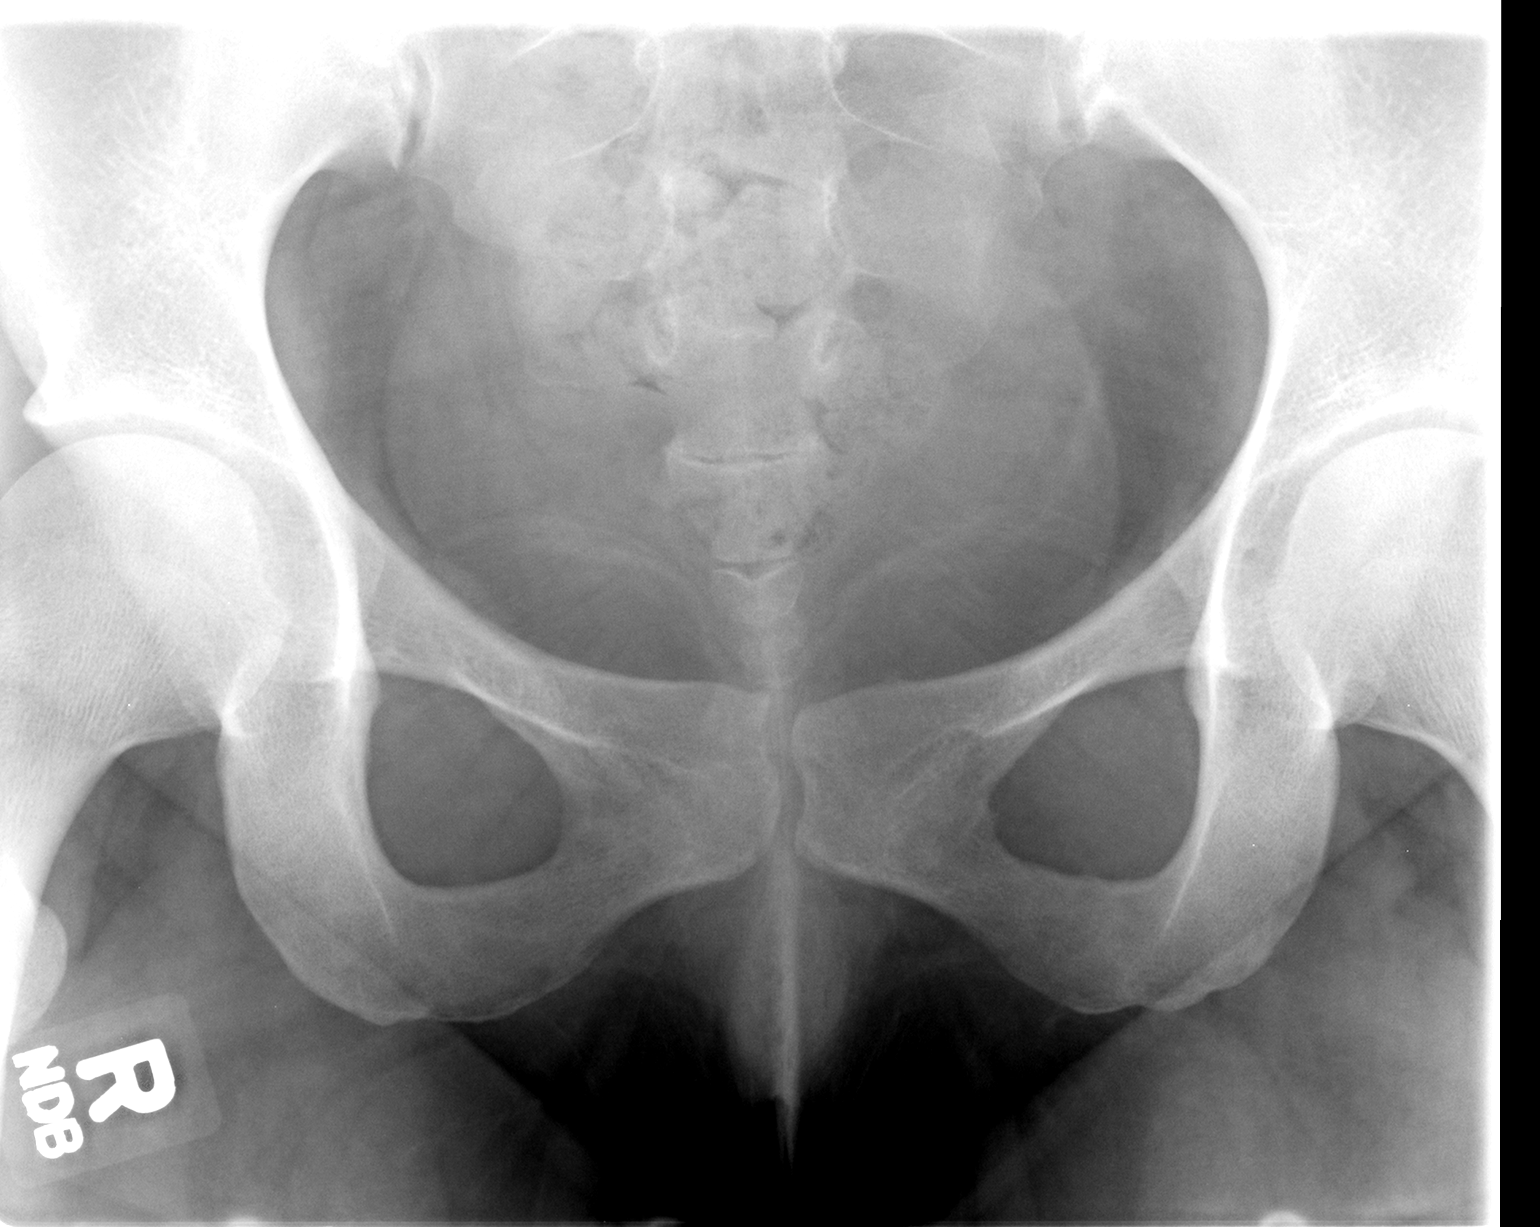

[3 of 3 positions shown; findings below may reference images not displayed]

FINDINGS: Moderate stool throughout the colon. Nonobstructive bowel gas
pattern. No organomegaly, free air or calcifications. Visualized
lung bases are clear. No acute bony abnormality.
IMPRESSION: Moderate stool burden suggesting constipation.

## 2015-12-24 ENCOUNTER — Telehealth: Payer: Self-pay | Admitting: *Deleted

## 2015-12-24 NOTE — Telephone Encounter (Signed)
Patient's mother called and asked what type of antibiotic ointment the patient should take on a trip out of the country.  Per Dr Oneta RackMcKeown, OTC Neosporin or Triple Antibiotic Ointment should be OK.  Mother was advised.

## 2016-02-03 ENCOUNTER — Encounter: Payer: Self-pay | Admitting: Allergy

## 2016-02-03 ENCOUNTER — Ambulatory Visit (INDEPENDENT_AMBULATORY_CARE_PROVIDER_SITE_OTHER): Payer: BLUE CROSS/BLUE SHIELD | Admitting: Allergy

## 2016-02-03 VITALS — BP 118/70 | HR 88 | Temp 98.0°F | Resp 16

## 2016-02-03 DIAGNOSIS — J301 Allergic rhinitis due to pollen: Secondary | ICD-10-CM

## 2016-02-03 DIAGNOSIS — J4530 Mild persistent asthma, uncomplicated: Secondary | ICD-10-CM

## 2016-02-03 MED ORDER — FLUTICASONE FUROATE-VILANTEROL 200-25 MCG/INH IN AEPB: 1.0000 | INHALATION_SPRAY | Freq: Every day | RESPIRATORY_TRACT | 5 refills | Status: DC

## 2016-02-03 MED ORDER — LEVALBUTEROL TARTRATE 45 MCG/ACT IN AERO: 2.0000 | INHALATION_SPRAY | Freq: Four times a day (QID) | RESPIRATORY_TRACT | 5 refills | Status: DC | PRN

## 2016-02-03 MED ORDER — AZELASTINE HCL 0.15 % NA SOLN: 2.0000 | Freq: Two times a day (BID) | NASAL | 5 refills | Status: DC | PRN

## 2016-02-03 NOTE — Patient Instructions (Signed)
1. Mild persistent asthma, uncomplicated Start use of Breo 200/25 1 puff daily -- use everyday  (rinse and spit after use). Use Xopenex 2 puff 15-20 min prior to activity and as needed (you should have less side effects with xopenex use)  Asthma control goals:   Full participation in all desired activities (may need albuterol before activity)  Albuterol use two time or less a week on average (not counting use with activity)  Cough interfering with sleep two time or less a month  Oral steroids no more than once a year  No hospitalizations   2. Allergic rhinitis due to pollen Use Astelin nasal spray 2 sprays twice a day as needed Use Zyrtec as needed  Follow-up 3 months

## 2016-02-03 NOTE — Progress Notes (Addendum)
Follow-up Note  RE: Lori West MRN: [id number] DOB: 01/09/1996 Date of Office Visit: 02/03/2016   History of present illness: Lori West is a 20 y.o. female presenting today for follow-up of asthma and allergy.  Last seen in May 2017 by Dr. Nunzio CobbsBobbitt.    Asthma:   She reports exercise is a trigger of her asthma symptoms and can be symptomatic for about the next 1-2 days after exercise with chest tightness, coughing, difficulty breathing.  She has not exercised in the past week.  She uses Proair respiclick after exercise but tries and does not like to use it because she notices her heart races and pounds after use.   She does have xopenex at home but has not opened the box.   She also has Breo at home that was prescribed at last visit but has not started use either.    She denies nighttime awakenings and no Ed/urgent care visits or steroids or hospitalization in past year.   She also has Dymista that she uses as needed for nasal congestion.  Will take zyrtec as needed as well.       ACT score 19 - not well controlled Review of systems: Review of Systems  Constitutional: Negative for chills and fever.  HENT: Negative for sore throat.   Eyes: Negative for redness.  Respiratory: Positive for cough and shortness of breath. Negative for wheezing.   Cardiovascular: Positive for palpitations.  Gastrointestinal: Negative for nausea and vomiting.  Skin: Negative for rash.  Neurological: Negative for headaches.    All other systems negative unless noted above in HPI  Past medical/social/surgical/family history have been reviewed and are unchanged unless specifically indicated below.  No changes  Medication List:   Medication List       Accurate as of 02/03/16 12:08 PM. Always use your most recent med list.          AEROCHAMBER PLUS FLO-VU Misc   Albuterol Sulfate 108 (90 Base) MCG/ACT Aepb Commonly known as:  PROAIR RESPICLICK Inhale 2 puffs into the lungs every 4  (four) hours as needed.   Azelastine HCl 0.15 % Soln   levalbuterol 45 MCG/ACT inhaler Commonly known as:  XOPENEX HFA   meningococcal polysaccharide injection Commonly known as:  MENACTRA Inject 0.5 mLs into the muscle once.   PROBIOTIC PO Take 1 capsule by mouth 2 (two) times daily.   Vitamin D3 5000 units Tabs Take 1 tablet by mouth daily.       Known medication allergies: Allergies  Allergen Reactions  . Peanut-Containing Drug Products Other (See Comments)    Tingling and numbness in throat from tree nuts  . Alcohol-Sulfur [Sulfur]   . Lactose Intolerance (Gi)     Not a true allergy- causes acne breakouts     Physical examination: Blood pressure 118/70, pulse 88, temperature 98 F (36.7 C), temperature source Oral, resp. rate 16, SpO2 97 %.  General: Alert, interactive, in no acute distress. HEENT: TMs pearly gray, turbinates minimally edematous without discharge, post-pharynx non erythematous. Neck: Supple without lymphadenopathy. Lungs: Clear to auscultation without wheezing, rhonchi or rales. {no increased work of breathing. CV: Normal S1, S2 without murmurs. Abdomen: Nondistended, nontender. Skin: Warm and dry, without lesions or rashes. Extremities:  No clubbing, cyanosis or edema. Neuro:   Grossly intact.  Diagnositics/Labs:  Spirometry: FEV1: 89%, FVC: 93%, ratio consistent with non-obstructive pattern.   Assessment and plan:   1. Mild persistent asthma, uncomplicated Start use of Breo 200/25 1  puff daily -- use everyday  (rinse and spit after use). Use Xopenex 2 puff 15-20 min prior to activity and as needed (you should have less side effects with xopenex use)  Asthma control goals:   Full participation in all desired activities (may need albuterol before activity)  Albuterol use two time or less a week on average (not counting use with activity)  Cough interfering with sleep two time or less a month  Oral steroids no more than once a  year  No hospitalizations Let us know if you are not meeting these goals  2. Allergic rhinitis due to pollen Use Astelin nasal spray 2 sprays twice a day as needed Recommended use of nasal saline rinse prior to nasal spray use Use Zyrtec as needed  Follow-up 3 month  I appreciate the opportunity to [facility] part [facility]. Please do not hesitate to contact me with questions.  Sincerely,   Margo AyeShaylar Dachelle Molzahn, MD Allergy/Immunology [facility]

## 2016-02-10 ENCOUNTER — Encounter: Payer: Self-pay | Admitting: Internal Medicine

## 2016-02-10 ENCOUNTER — Ambulatory Visit (INDEPENDENT_AMBULATORY_CARE_PROVIDER_SITE_OTHER): Payer: BLUE CROSS/BLUE SHIELD | Admitting: Internal Medicine

## 2016-02-10 VITALS — BP 118/62 | HR 69 | Temp 97.6°F | Resp 16 | Ht 62.25 in | Wt 189.8 lb

## 2016-02-10 DIAGNOSIS — H00026 Hordeolum internum left eye, unspecified eyelid: Secondary | ICD-10-CM | POA: Diagnosis not present

## 2016-02-10 MED ORDER — ERYTHROMYCIN 5 MG/GM OP OINT: TOPICAL_OINTMENT | Freq: Three times a day (TID) | OPHTHALMIC | 0 refills | Status: AC

## 2016-02-10 NOTE — Progress Notes (Signed)
   Subjective:    Patient ID: Lori West, female    DOB: 07/26/1995, 20 y.o.   MRN: [id number]  HPI  Patient presents to the office for evaluation of left eye drainage and pain which started last night.  She reports that she has been having some yellow and thick mucous from the tear duct and then clear drainage.  No itching or watering.  No reports the eye lid is senstive.  No contacts no glasses.  She has had some congestion.   Review of Systems  Eyes: Positive for pain and discharge. Negative for photophobia, redness, itching and visual disturbance.       Objective:   Physical Exam  Constitutional: She appears well-developed and well-nourished. No distress.  HENT:  Head: Normocephalic.  Mouth/Throat: Oropharynx is clear and moist. No oropharyngeal exudate.  Eyes: Conjunctivae and EOM are normal. Pupils are equal, round, and reactive to light. Left eye exhibits hordeolum. Left eye exhibits no chemosis, no discharge and no exudate. No foreign body present in the left eye.  Neck: Normal range of motion. Neck supple. No JVD present. No thyromegaly present.  Cardiovascular: Normal rate, regular rhythm, normal heart sounds and intact distal pulses.  Exam reveals no gallop and no friction rub.   No murmur heard. Pulmonary/Chest: Effort normal and breath sounds normal.  Lymphadenopathy:    She has no cervical adenopathy.  Skin: She is not diaphoretic.  Nursing note and vitals reviewed.   Vitals:   02/10/16 1450  BP: 118/62  Pulse: 69  Resp: 16  Temp: 97.6 F (36.4 C)          Assessment & Plan:    1. Internal hordeolum of left eye -romycin -warm compresses -go to Eye doctor if changes in vision

## 2016-02-10 NOTE — Patient Instructions (Signed)
Stye A stye is a bump on your eyelid caused by a bacterial infection. A stye can form inside the eyelid (internal stye) or outside the eyelid (external stye). An internal stye may be caused by an infected oil-producing gland inside your eyelid. An external stye may be caused by an infection at the base of your eyelash (hair follicle). Styes are very common. Anyone can get them at any age. They usually occur in just one eye, but you may have more than one in either eye.  CAUSES  The infection is almost always caused by bacteria called Staphylococcus aureus. This is a common type of bacteria that lives on your skin. RISK FACTORS You may be at higher risk for a stye if you have had one before. You may also be at higher risk if you have:  Diabetes.  Long-term illness.  Long-term eye redness.  A skin condition called seborrhea.  High fat levels in your blood (lipids). SIGNS AND SYMPTOMS  Eyelid pain is the most common symptom of a stye. Internal styes are more painful than external styes. Other signs and symptoms may include:  Painful swelling of your eyelid.  A scratchy feeling in your eye.  Tearing and redness of your eye.  Pus draining from the stye. DIAGNOSIS  Your health care provider may be able to diagnose a stye just by examining your eye. The health care provider may also check to make sure:  You do not have a fever or other signs of a more serious infection.  The infection has not spread to other parts of your eye or areas around your eye. TREATMENT  Most styes will clear up in a few days without treatment. In some cases, you may need to use antibiotic drops or ointment to prevent infection. Your health care provider may have to drain the stye surgically if your stye is:  Large.  Causing a lot of pain.  Interfering with your vision. This can be done using a thin blade or a needle.  HOME CARE INSTRUCTIONS   Take medicines only as directed by your health care  provider.  Apply a clean, warm compress to your eye for 10 minutes, 4 times a day.  Do not wear contact lenses or eye makeup until your stye has healed.  Do not try to pop or drain the stye. SEEK MEDICAL CARE IF:  You have chills or a fever.  Your stye does not go away after several days.  Your stye affects your vision.  Your eyeball becomes swollen, red, or painful. MAKE SURE YOU:  Understand these instructions.  Will watch your condition.  Will get help right away if you are not doing well or get worse.   This information is not intended to replace advice given to you by your health care provider. Make sure you discuss any questions you have with your health care provider.   Document Released: 03/16/2005 Document Revised: 06/27/2014 Document Reviewed: 09/20/2013 Elsevier Interactive Patient Education 2016 [employer]  

## 2016-02-24 ENCOUNTER — Encounter: Payer: Self-pay | Admitting: Internal Medicine

## 2016-02-24 DIAGNOSIS — Z713 Dietary counseling and surveillance: Secondary | ICD-10-CM | POA: Diagnosis not present

## 2016-02-29 ENCOUNTER — Encounter: Payer: Self-pay | Admitting: Physician Assistant

## 2016-02-29 ENCOUNTER — Ambulatory Visit (INDEPENDENT_AMBULATORY_CARE_PROVIDER_SITE_OTHER): Payer: BLUE CROSS/BLUE SHIELD | Admitting: Physician Assistant

## 2016-02-29 VITALS — BP 126/70 | HR 95 | Temp 97.9°F | Resp 16 | Ht 63.0 in | Wt 191.2 lb

## 2016-02-29 DIAGNOSIS — B370 Candidal stomatitis: Secondary | ICD-10-CM

## 2016-02-29 DIAGNOSIS — J029 Acute pharyngitis, unspecified: Secondary | ICD-10-CM | POA: Diagnosis not present

## 2016-02-29 LAB — POCT RAPID STREP A (OFFICE): Rapid Strep A Screen: NEGATIVE

## 2016-02-29 MED ORDER — NYSTATIN 100000 UNIT/ML MT SUSP: OROMUCOSAL | 1 refills | Status: DC

## 2016-02-29 NOTE — Progress Notes (Signed)
   Subjective:    Patient ID: Lori West, female    DOB: 04/23/1996, 20 y.o.   MRN: [id number]  HPI 20 y.o. WF with history of asthma presents with sore throat x 3 days, follows with allergy doctor. Sinus drainage, coughing non productive, denies fever, chills, wheezing, SOB. Not on allergy pill, on dymista occ, but has astelin occ. Has been using breo for 8 days.   Blood pressure 126/70, pulse 95, temperature 97.9 F (36.6 C), resp. rate 16, height 5\' 3"  (1.6 m), weight 191 lb 3.2 oz (86.7 kg), SpO2 97 %.  Medications Current Outpatient Prescriptions on File Prior to Visit  Medication Sig  . Albuterol Sulfate (PROAIR RESPICLICK) 108 (90 Base) MCG/ACT AEPB Inhale 2 puffs into the lungs every 4 (four) hours as needed.  . Azelastine HCl 0.15 % SOLN Place 2 sprays into both nostrils 2 (two) times daily as needed.  . Cholecalciferol (VITAMIN D3) 5000 UNITS TABS Take 1 tablet by mouth daily.  . fluticasone furoate-vilanterol (BREO ELLIPTA) 200-25 MCG/INH AEPB Inhale 1 puff into the lungs daily.  Marland Kitchen. levalbuterol (XOPENEX HFA) 45 MCG/ACT inhaler Inhale 2 puffs into the lungs every 6 (six) hours as needed for wheezing (Use 15 minutes prior to activity).  . meningococcal polysaccharide (MENACTRA) injection Inject 0.5 mLs into the muscle once.  . Probiotic Product (PROBIOTIC PO) Take 1 capsule by mouth 2 (two) times daily.  Marland Kitchen. Spacer/Aero-Holding Chambers (AEROCHAMBER PLUS FLO-VU) MISC    No current facility-administered medications on file prior to visit.     Problem list She has Morbid obesity (BMI 34+); Intrinsic asthma; Migraine with aura and without status migrainosus, not intractable; Mild persistent asthma with mild exacerbation; and Chronic rhinitis on her problem list.  Review of Systems  Constitutional: Negative for chills and diaphoresis.  HENT: Positive for ear pain, postnasal drip, rhinorrhea and sore throat. Negative for congestion, sinus pressure and sneezing.   Respiratory:  Negative.  Negative for cough and shortness of breath.   Cardiovascular: Negative.   Musculoskeletal: Negative for neck pain.  Neurological: Negative.  Negative for headaches.       Objective:   Physical Exam  Constitutional: She appears well-developed and well-nourished. No distress.  HENT:  Right Ear: Hearing, tympanic membrane, external ear and ear canal normal.  Left Ear: Hearing, tympanic membrane, external ear and ear canal normal.  Nose: Right sinus exhibits no maxillary sinus tenderness. Left sinus exhibits no maxillary sinus tenderness.  Mouth/Throat: Uvula is midline. No trismus in the jaw. No uvula swelling. Oropharyngeal exudate (white scrapable plaques on posterior pharynx) and posterior oropharyngeal erythema present. No posterior oropharyngeal edema or tonsillar abscesses.  Eyes: Conjunctivae and EOM are normal. Pupils are equal, round, and reactive to light.  Neck: Normal range of motion. Neck supple.  Cardiovascular: Normal rate and regular rhythm.   Pulmonary/Chest: Effort normal and breath sounds normal.  Lymphadenopathy:    She has no cervical adenopathy.       Assessment & Plan:  Sore throat- yeast/URI/allergies ?likely thrush from breo - negative strep- will give nystatin mouth wash to use, get on flonase/astelin, get on allegra, if not better 5 days call the office

## 2016-02-29 NOTE — Patient Instructions (Signed)
Get on allegra Get on flonase and astelin Can use vinegar small amount with mouth wash or in water to gargle with to prevent thrush.  Can try symbicort WITH spacer!!!  Thrush, Adult Ginette Pitman, also called oral candidiasis, is a fungal infection that develops in the mouth and throat and on the tongue. It causes white patches to form on the mouth and tongue. Ginette Pitman is most common in older adults, but it can occur at any age.  Many cases of thrush are mild, but this infection can also be more serious. Ginette Pitman can be a recurring problem for people who have chronic illnesses or who take medicines that limit the body's ability to fight infection. Because these people have difficulty fighting infections, the fungus that causes thrush can spread throughout the body. This can cause life-threatening blood or organ infections. CAUSES  Ginette Pitman is usually caused by a yeast called Candida albicans. This fungus is normally present in small amounts in the mouth and on other mucous membranes. It usually causes no harm. However, when conditions are present that allow the fungus to grow uncontrolled, it invades surrounding tissues and becomes an infection. Less often, other Candida species can also lead to thrush.  RISK FACTORS Ginette Pitman is more likely to develop in the following people:  People with an impaired ability to fight infection (weakened immune system).   Older adults.   People with HIV.   People with diabetes.   People with dry mouth (xerostomia).   Pregnant women.   People with poor dental care, especially those who have false teeth.   People who use antibiotic medicines.  SIGNS AND SYMPTOMS  Ginette Pitman can be a mild infection that causes no symptoms. If symptoms develop, they may include:   A burning feeling in the mouth and throat. This can occur at the start of a thrush infection.   White patches that adhere to the mouth and tongue. The tissue around the patches may be red, raw, and  painful. If rubbed (during tooth brushing, for example), the patches and the tissue of the mouth may bleed easily.   A bad taste in the mouth or difficulty tasting foods.   Cottony feeling in the mouth.   Pain during eating and swallowing. DIAGNOSIS  Your health care provider can usually diagnose thrush by looking in your mouth and asking you questions about your health.  TREATMENT  Medicines that help prevent the growth of fungi (antifungals) are the standard treatment for thrush. These medicines are either applied directly to the affected area (topical) or swallowed (oral). The treatment will depend on the severity of the condition.  Mild Thrush Mild cases of thrush may clear up with the use of an antifungal mouth rinse or lozenges. Treatment usually lasts about 14 days.  Moderate to Severe Thrush  More severe thrush infections that have spread to the esophagus are treated with an oral antifungal medicine. A topical antifungal medicine may also be used.   For some severe infections, a treatment period longer than 14 days may be needed.   Oral antifungal medicines are almost never used during pregnancy because the fetus may be harmed. However, if a pregnant woman has a rare, severe thrush infection that has spread to her blood, oral antifungal medicines may be used. In this case, the risk of harm to the mother and fetus from the severe thrush infection may be greater than the risk posed by the use of antifungal medicines.  Persistent or Recurrent Thrush For cases of thrush  that do not go away or keep coming back, treatment may involve the following:   Treatment may be needed twice as long as the symptoms last.   Treatment will include both oral and topical antifungal medicines.   People with weakened immune systems can take an antifungal medicine on a continuous basis to prevent thrush infections.  It is important to treat conditions that make you more likely to get thrush, such  as diabetes or HIV.  HOME CARE INSTRUCTIONS   Only take over-the-counter or prescription medicine as directed by your health care provider. Talk to your health care provider about an over-the-counter medicine called gentian violet, which kills bacteria and fungi.   Eat plain, unflavored yogurt as directed by your health care provider. Check the label to make sure the yogurt contains live cultures. This yogurt can help healthy bacteria grow in the mouth that can stop the growth of the fungus that causes thrush.   Try these measures to help reduce the discomfort of thrush:   Drink cold liquids such as water or iced tea.   Try flavored ice treats or frozen juices.   Eat foods that are easy to swallow, such as gelatin, ice cream, or custard.   If the patches in your mouth are painful, try drinking from a straw.   Rinse your mouth several times a day with a warm saltwater rinse. You can make the saltwater mixture with 1 tsp (6 g) of salt in 8 fl oz (0.2 L) of warm water.   If you wear dentures, remove the dentures before going to bed, brush them vigorously, and soak them in a cleaning solution as directed by your health care provider.   Women who are breastfeeding should clean their nipples with an antifungal medicine as directed by their health care provider. Dry the nipples after breastfeeding. Applying lanolin-containing body lotion may help relieve nipple soreness.  SEEK MEDICAL CARE IF:  Your symptoms are getting worse or are not improving within 7 days of starting treatment.   You have symptoms of spreading infection, such as white patches on the skin outside of the mouth.   You are nursing and you have redness, burning, or pain in the nipples that is not relieved with treatment.  MAKE SURE YOU:  Understand these instructions.  Will watch your condition.  Will get help right away if you are not doing well or get worse.   This information is not intended to replace  advice given to you by your health care provider. Make sure you discuss any questions you have with your health care provider.   Document Released: 03/01/2004 Document Revised: 06/27/2014 Document Reviewed: 01/07/2013 Elsevier Interactive Patient Education [employer]2016 Elsevier Inc.

## 2016-03-07 DIAGNOSIS — B373 Candidiasis of vulva and vagina: Secondary | ICD-10-CM | POA: Diagnosis not present

## 2016-03-08 ENCOUNTER — Ambulatory Visit: Payer: BLUE CROSS/BLUE SHIELD | Admitting: Allergy and Immunology

## 2016-03-15 ENCOUNTER — Encounter: Payer: Self-pay | Admitting: Neurology

## 2016-03-16 DIAGNOSIS — Z713 Dietary counseling and surveillance: Secondary | ICD-10-CM | POA: Diagnosis not present

## 2016-03-23 DIAGNOSIS — N6002 Solitary cyst of left breast: Secondary | ICD-10-CM | POA: Diagnosis not present

## 2016-04-05 ENCOUNTER — Encounter: Payer: Self-pay | Admitting: Allergy and Immunology

## 2016-04-05 ENCOUNTER — Ambulatory Visit (INDEPENDENT_AMBULATORY_CARE_PROVIDER_SITE_OTHER): Payer: BLUE CROSS/BLUE SHIELD | Admitting: Allergy and Immunology

## 2016-04-05 VITALS — BP 128/86 | HR 84 | Temp 97.8°F | Resp 18 | Ht 63.0 in | Wt 198.2 lb

## 2016-04-05 DIAGNOSIS — J300 Vasomotor rhinitis: Secondary | ICD-10-CM

## 2016-04-05 DIAGNOSIS — L729 Follicular cyst of the skin and subcutaneous tissue, unspecified: Secondary | ICD-10-CM | POA: Insufficient documentation

## 2016-04-05 DIAGNOSIS — J4530 Mild persistent asthma, uncomplicated: Secondary | ICD-10-CM | POA: Diagnosis not present

## 2016-04-05 DIAGNOSIS — Z872 Personal history of diseases of the skin and subcutaneous tissue: Secondary | ICD-10-CM | POA: Diagnosis not present

## 2016-04-05 MED ORDER — BECLOMETHASONE DIPROPIONATE 40 MCG/ACT IN AERS: 2.0000 | INHALATION_SPRAY | Freq: Two times a day (BID) | RESPIRATORY_TRACT | 5 refills | Status: DC

## 2016-04-05 NOTE — Progress Notes (Signed)
Follow-up Note  RE: JERMANY SUNDELL MRN: [id number] DOB: 1996-05-17 Date of Office Visit: 04/05/2016  Primary care provider: Nadean Corwin, MD Referring provider: Lucky Cowboy, MD  History of present illness: Lori West is a 20 y.o. female with persistent asthma and allergic rhinitis presenting today for follow up.  She was last seen in this clinic on 02/03/2016.  She reports that she developed thrush with embryonal lip to after only 5 days of use.  She was treated successfully with Diflucan.  She has not been using an asthma controller medication in the past 4-6 weeks.  She has been experiencing lower respiratory symptoms with exercise and after exercise.  She has failed montelukast in the past.  She has no nasal symptom complaints today.   Assessment and plan: Mild persistent asthma  A sample and prescription have been provided for Qvar (beclomethasone) 40 g,  2 inhalations via spacer device twice a day. During respiratory tract infections or asthma flares, increase Qvar 40 g to 3 inhalations 3 times per day until symptoms have returned to baseline.  Continue albuterol HFA, 1-2 inhalations every 4-6 hours as needed and 15 minutes prior to vigorous exercise.  Subjective and objective measures of pulmonary function will be followed and the treatment plan will be adjusted accordingly.  Chronic rhinitis  Continue Dymista nasal spray as needed and nasal saline irrigation as needed.   Meds ordered this encounter  Medications  . beclomethasone (QVAR) 40 MCG/ACT inhaler    Sig: Inhale 2 puffs into the lungs 2 (two) times daily.    Dispense:  1 Inhaler    Refill:  5    Diagnostics: Spirometry reveals an FVC of 2.68 L and an FEV1 of 2.24 L (72% predicted) with an FEV1 ratio of 94%.  Please see scanned spirometry results for details.    Physical examination: Blood pressure 128/86, pulse 84, temperature 97.8 F (36.6 C), temperature source Oral, resp. rate 18,  height 5\' 3"  (1.6 m), weight 198 lb 3.2 oz (89.9 kg), SpO2 97 %.  General: Alert, interactive, in no acute distress. HEENT: TMs pearly gray, turbinates mildly edematous without discharge, post-pharynx mildly erythematous. Neck: Supple without lymphadenopathy. Lungs: Clear to auscultation without wheezing, rhonchi or rales. CV: Normal S1, S2 without murmurs. Skin: Warm and dry, without lesions or rashes.  The following portions of the patient's history were reviewed and updated as appropriate: allergies, current medications, past family history, past medical history, past social history, past surgical history and problem list.    Medication List       Accurate as of 04/05/16  4:01 PM. Always use your most recent med list.          AEROCHAMBER PLUS FLO-VU Misc   Albuterol Sulfate 108 (90 Base) MCG/ACT Aepb Commonly known as:  PROAIR RESPICLICK Inhale 2 puffs into the lungs every 4 (four) hours as needed.   Azelastine HCl 0.15 % Soln Place 2 sprays into both nostrils 2 (two) times daily as needed.   beclomethasone 40 MCG/ACT inhaler Commonly known as:  QVAR Inhale 2 puffs into the lungs 2 (two) times daily.   fluticasone furoate-vilanterol 200-25 MCG/INH Aepb Commonly known as:  BREO ELLIPTA Inhale 1 puff into the lungs daily.   levalbuterol 45 MCG/ACT inhaler Commonly known as:  XOPENEX HFA Inhale 2 puffs into the lungs every 6 (six) hours as needed for wheezing (Use 15 minutes prior to activity).   meningococcal polysaccharide injection Commonly known as:  MENACTRA Inject 0.5 mLs into  the muscle once.   nystatin 100000 UNIT/ML suspension Commonly known as:  MYCOSTATIN Take 5 ml TID, Swish and Spit. Use for 48 hours after symptoms resolve.   PROBIOTIC PO Take 1 capsule by mouth 2 (two) times daily.   Vitamin D3 5000 units Tabs Take 1 tablet by mouth daily.       Allergies  Allergen Reactions  . Breo Ellipta [Fluticasone Furoate-Vilanterol] Other (See  Comments)    Thrush  . Peanut-Containing Drug Products Other (See Comments)    Tingling and numbness in throat from tree nuts  . Prednisone Other (See Comments)    Elevated temperature  . Alcohol-Sulfur [Sulfur]   . Lactose Intolerance (Gi)     Not a true allergy- causes acne breakouts  . Metronidazole     Other reaction(s): Other (See Comments) Seizures and high temp.    I appreciate the opportunity to take part in [facility]'s care. Please do not hesitate to contact me with questions.  Sincerely,   R. Jorene Guestarter Manna Gose, MD

## 2016-04-05 NOTE — Assessment & Plan Note (Signed)
   A sample and prescription have been provided for Qvar (beclomethasone) 40 g, 2 inhalations via spacer device twice a day. During respiratory tract infections or asthma flares, increase Qvar 40 g to 3 inhalations 3 times per day until symptoms have returned to baseline.  Continue albuterol HFA, 1-2 inhalations every 4-6 hours as needed and 15 minutes prior to vigorous exercise.  Subjective and objective measures of pulmonary function will be followed and the treatment plan will be adjusted accordingly.

## 2016-04-05 NOTE — Patient Instructions (Signed)
Mild persistent asthma  A sample and prescription have been provided for Qvar (beclomethasone) 40 g,  2 inhalations via spacer device twice a day. During respiratory tract infections or asthma flares, increase Qvar 40 g to 3 inhalations 3 times per day until symptoms have returned to baseline.  Continue albuterol HFA, 1-2 inhalations every 4-6 hours as needed and 15 minutes prior to vigorous exercise.  Subjective and objective measures of pulmonary function will be followed and the treatment plan will be adjusted accordingly.  Chronic rhinitis  Continue Dymista nasal spray as needed and nasal saline irrigation as needed.   Return in about 4 months (around 08/06/2016), or if symptoms worsen or fail to improve.

## 2016-04-05 NOTE — Assessment & Plan Note (Signed)
   Continue Dymista nasal spray as needed and nasal saline irrigation as needed.

## 2016-04-07 ENCOUNTER — Ambulatory Visit: Payer: BLUE CROSS/BLUE SHIELD | Admitting: Neurology

## 2016-04-13 ENCOUNTER — Encounter: Payer: Self-pay | Admitting: Neurology

## 2016-04-13 ENCOUNTER — Ambulatory Visit (INDEPENDENT_AMBULATORY_CARE_PROVIDER_SITE_OTHER): Payer: BLUE CROSS/BLUE SHIELD | Admitting: Neurology

## 2016-04-13 VITALS — BP 134/82 | HR 77 | Wt 198.4 lb

## 2016-04-13 DIAGNOSIS — Z713 Dietary counseling and surveillance: Secondary | ICD-10-CM | POA: Diagnosis not present

## 2016-04-13 DIAGNOSIS — G43101 Migraine with aura, not intractable, with status migrainosus: Secondary | ICD-10-CM | POA: Diagnosis not present

## 2016-04-13 MED ORDER — SUMATRIPTAN SUCCINATE 100 MG PO TABS: 100.0000 mg | ORAL_TABLET | Freq: Once | ORAL | 12 refills | Status: DC | PRN

## 2016-04-13 MED ORDER — PROCHLORPERAZINE MALEATE 10 MG PO TABS: 10.0000 mg | ORAL_TABLET | Freq: Four times a day (QID) | ORAL | 12 refills | Status: DC | PRN
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
## 2016-04-13 NOTE — Patient Instructions (Addendum)
Remember to drink plenty of fluid, eat healthy meals and do not skip any meals. Try to eat protein with a every meal and eat a healthy snack such as fruit or nuts in between meals. Try to keep a regular sleep-wake schedule and try to exercise daily, particularly in the form of walking, 20-30 minutes a day, if you can.   As far as your medications are concerned, I would like to suggest:  Imitrex and compazine at onset of headache. Please take one tablet at the onset of your headache. If it does not improve the symptoms please take one additional tablet in 2 hours. Do not take more then 2 tablets in 24hrs. Do not take use more then 2 to 3 times in a week.  I would like to see you back as needed, sooner if we need to. Please call us with any interim questions, concerns, problems, updates or refill requests.   Our phone number is [phone]. We also have an after hours call service for urgent matters and there is a physician on-call for urgent questions. For any emergencies you know to call 911 or go to the nearest emergency room  Prochlorperazine tablets What is this medicine? PROCHLORPERAZINE (proe klor PER a zeen) helps to control severe nausea and vomiting. This medicine is also used to treat schizophrenia. It can also help patients who experience anxiety that is not due to psychological illness. This medicine may be used for other purposes; ask your health care provider or pharmacist if you have questions. What should I tell my health care provider before I take this medicine? They need to know if you have any of these conditions: -blood disorders or disease -dementia -liver disease or jaundice -Parkinson's disease -uncontrollable movement disorder -an unusual or allergic reaction to prochlorperazine, other medicines, foods, dyes, or preservatives -pregnant or trying to get pregnant -breast-feeding How should I use this medicine? Take this medicine by mouth with a glass of water. Follow  the directions on the prescription label. Take your doses at regular intervals. Do not take your medicine more often than directed. Do not stop taking this medicine suddenly. This can cause nausea, vomiting, and dizziness. Ask your doctor or health care professional for advice. Talk to your pediatrician regarding the use of this medicine in children. Special care may be needed. While this drug may be prescribed for children as young as 2 years for selected conditions, precautions do apply. Overdosage: If you think you have taken too much of this medicine contact a poison control center or emergency room at once. NOTE: This medicine is only for you. Do not share this medicine with others. What if I miss a dose? If you miss a dose, take it as soon as you can. If it is almost time for your next dose, take only that dose. Do not take double or extra doses. What may interact with this medicine? Do not take this medicine with any of the following medications: -amoxapine -antidepressants like citalopram, escitalopram, fluoxetine, paroxetine, and sertraline -deferoxamine -dofetilide -maprotiline -tricyclic antidepressants like amitriptyline, clomipramine, imipramine, nortiptyline and others This medicine may also interact with the following medications: -lithium -medicines for pain -phenytoin -propranolol -warfarin This list may not describe all possible interactions. Give your health care provider a list of all the medicines, herbs, non-prescription drugs, or dietary supplements you use. Also tell them if you smoke, drink alcohol, or use illegal drugs. Some items may interact with your medicine. What should I watch for while using this  medicine? Visit your doctor or health care professional for regular checks on your progress. You may get drowsy or dizzy. Do not drive, use machinery, or do anything that needs mental alertness until you know how this medicine affects you. Do not stand or sit up  quickly, especially if you are an older patient. This reduces the risk of dizzy or fainting spells. Alcohol may interfere with the effect of this medicine. Avoid alcoholic drinks. This medicine can reduce the response of your body to heat or cold. Dress warm in cold weather and stay hydrated in hot weather. If possible, avoid extreme temperatures like saunas, hot tubs, very hot or cold showers, or activities that can cause dehydration such as vigorous exercise. This medicine can make you more sensitive to the sun. Keep out of the sun. If you cannot avoid being in the sun, wear protective clothing and use sunscreen. Do not use sun lamps or tanning beds/booths. Your mouth may get dry. Chewing sugarless gum or sucking hard candy, and drinking plenty of water may help. Contact your doctor if the problem does not go away or is severe. What side effects may I notice from receiving this medicine? Side effects that you should report to your doctor or health care professional as soon as possible: -blurred vision -breast enlargement in men or women -breast milk in women who are not breast-feeding -chest pain, fast or irregular heartbeat -confusion, restlessness -dark yellow or brown urine -difficulty breathing or swallowing -dizziness or fainting spells -drooling, shaking, movement difficulty (shuffling walk) or rigidity -fever, chills, sore throat -involuntary or uncontrollable movements of the eyes, mouth, head, arms, and legs -seizures -stomach area pain -unusually weak or tired -unusual bleeding or bruising -yellowing of skin or eyes Side effects that usually do not require medical attention (report to your doctor or health care professional if they continue or are bothersome): -difficulty passing urine -difficulty sleeping -headache -sexual dysfunction -skin rash, or itching This list may not describe all possible side effects. Call your doctor for medical advice about side effects. You may  report side effects to FDA at 1-800-FDA-1088. Where should I keep my medicine? Keep out of the reach of children. Store at room temperature between 15 and 30 degrees C (59 and 86 degrees F). Protect from light. Throw away any unused medicine after the expiration date. NOTE: This sheet is a summary. It may not cover all possible information. If you have questions about this medicine, talk to your doctor, pharmacist, or health care provider.    2016, Elsevier/Gold Standard. (2011-10-25 16:59:39)   Sumatriptan tablets What is this medicine? SUMATRIPTAN (soo ma TRIP tan) is used to treat migraines with or without aura. An aura is a strange feeling or visual disturbance that warns you of an attack. It is not used to prevent migraines. This medicine may be used for other purposes; ask your health care provider or pharmacist if you have questions. What should I tell my health care provider before I take this medicine? They need to know if you have any of these conditions: -circulation problems in fingers and toes -diabetes -heart disease -high blood pressure -high cholesterol -history of irregular heartbeat -history of stroke -kidney disease -liver disease -postmenopausal or surgical removal of uterus and ovaries -seizures -smoke tobacco -stomach or intestine problems -an unusual or allergic reaction to sumatriptan, other medicines, foods, dyes, or preservatives -pregnant or trying to get pregnant -breast-feeding How should I use this medicine? Take this medicine by mouth with a glass of  water. Follow the directions on the prescription label. This medicine is taken at the first symptoms of a migraine. It is not for everyday use. If your migraine headache returns after one dose, you can take another dose as directed. You must leave at least 2 hours between doses, and do not take more than 100 mg as a single dose. Do not take more than 200 mg total in any 24 hour period. If there is no  improvement at all after the first dose, do not take a second dose without talking to your doctor or health care professional. Do not take your medicine more often than directed. Talk to your pediatrician regarding the use of this medicine in children. Special care may be needed. Overdosage: If you think you have taken too much of this medicine contact a poison control center or emergency room at once. NOTE: This medicine is only for you. Do not share this medicine with others. What if I miss a dose? This does not apply; this medicine is not for regular use. What may interact with this medicine? Do not take this medicine with any of the following medicines: -cocaine -ergot alkaloids like dihydroergotamine, ergonovine, ergotamine, methylergonovine -feverfew -MAOIs like Carbex, Eldepryl, Marplan, Nardil, and Parnate -other medicines for migraine headache like almotriptan, eletriptan, frovatriptan, naratriptan, rizatriptan, zolmitriptan -tryptophan This medicine may also interact with the following medications: -certain medicines for depression, anxiety, or psychotic disturbances This list may not describe all possible interactions. Give your health care provider a list of all the medicines, herbs, non-prescription drugs, or dietary supplements you use. Also tell them if you smoke, drink alcohol, or use illegal drugs. Some items may interact with your medicine. What should I watch for while using this medicine? Only take this medicine for a migraine headache. Take it if you get warning symptoms or at the start of a migraine attack. It is not for regular use to prevent migraine attacks. You may get drowsy or dizzy. Do not drive, use machinery, or do anything that needs mental alertness until you know how this medicine affects you. To reduce dizzy or fainting spells, do not sit or stand up quickly, especially if you are an older patient. Alcohol can increase drowsiness, dizziness and flushing. Avoid  alcoholic drinks. Smoking cigarettes may increase the risk of heart-related side effects from using this medicine. If you take migraine medicines for 10 or more days a month, your migraines may get worse. Keep a diary of headache days and medicine use. Contact your healthcare professional if your migraine attacks occur more frequently. What side effects may I notice from receiving this medicine? Side effects that you should report to your doctor or health care professional as soon as possible: -allergic reactions like skin rash, itching or hives, swelling of the face, lips, or tongue -bloody or watery diarrhea -hallucination, loss of contact with reality -pain, tingling, numbness in the face, hands, or feet -seizures -signs and symptoms of a blood clot such as breathing problems; changes in vision; chest pain; severe, sudden headache; pain, swelling, warmth in the leg; trouble speaking; sudden numbness or weakness of the face, arm, or leg -signs and symptoms of a dangerous change in heartbeat or heart rhythm like chest pain; dizziness; fast or irregular heartbeat; palpitations, feeling faint or lightheaded; falls; breathing problems -signs and symptoms of a stroke like changes in vision; confusion; trouble speaking or understanding; severe headaches; sudden numbness or weakness of the face, arm, or leg; trouble walking; dizziness; loss of balance  or coordination -stomach pain Side effects that usually do not require medical attention (report these to your doctor or health care professional if they continue or are bothersome): -changes in taste -facial flushing -headache -muscle cramps -muscle pain -nausea, vomiting -weak or tired This list may not describe all possible side effects. Call your doctor for medical advice about side effects. You may report side effects to FDA at 1-800-FDA-1088. Where should I keep my medicine? Keep out of the reach of children. Store at room temperature between  2 and 30 degrees C (36 and 86 degrees F). Throw away any unused medicine after the expiration date. NOTE: This sheet is a summary. It may not cover all possible information. If you have questions about this medicine, talk to your doctor, pharmacist, or health care provider.    2016, Elsevier/Gold Standard. (2014-12-11 17:46:40)

## 2016-04-20 HISTORY — PX: BREAST LUMPECTOMY: SHX2

## 2016-05-04 DIAGNOSIS — Z713 Dietary counseling and surveillance: Secondary | ICD-10-CM | POA: Diagnosis not present

## 2016-05-11 DIAGNOSIS — L905 Scar conditions and fibrosis of skin: Secondary | ICD-10-CM | POA: Diagnosis not present

## 2016-05-11 DIAGNOSIS — L729 Follicular cyst of the skin and subcutaneous tissue, unspecified: Secondary | ICD-10-CM | POA: Diagnosis not present

## 2016-05-11 DIAGNOSIS — L720 Epidermal cyst: Secondary | ICD-10-CM | POA: Diagnosis not present

## 2016-05-24 ENCOUNTER — Telehealth: Payer: Self-pay

## 2016-05-24 NOTE — Telephone Encounter (Signed)
Per Victorino DikeJennifer  - pt clld in requesting emergency appt for breathing difficulty. I answered  stating she has been walking [place] [employer] [place] and began to feel winded. [facility] - pt stated she did not. Asked pt if she has used her regular and rescue inhalers - pt stated she had. Pt stated she just doesn't feel good.  Advsd pt there is only one provider in the office today with no openings. Advsd pt to go to nearest Urgent Care to be evaluated and treated. Pt stated ok, she would.

## 2016-06-07 ENCOUNTER — Ambulatory Visit (INDEPENDENT_AMBULATORY_CARE_PROVIDER_SITE_OTHER): Payer: BLUE CROSS/BLUE SHIELD | Admitting: Internal Medicine

## 2016-06-07 ENCOUNTER — Encounter: Payer: Self-pay | Admitting: Internal Medicine

## 2016-06-07 VITALS — BP 126/64 | HR 98 | Temp 97.8°F | Resp 16 | Ht 63.0 in

## 2016-06-07 DIAGNOSIS — J029 Acute pharyngitis, unspecified: Secondary | ICD-10-CM | POA: Diagnosis not present

## 2016-06-07 MED ORDER — FLUTICASONE PROPIONATE 50 MCG/ACT NA SUSP: 2.0000 | Freq: Every day | NASAL | 0 refills | Status: DC

## 2016-06-07 MED ORDER — PROMETHAZINE-DM 6.25-15 MG/5ML PO SYRP: 5.0000 mL | ORAL_SOLUTION | Freq: Four times a day (QID) | ORAL | 0 refills | Status: DC | PRN

## 2016-06-07 MED ORDER — AZELASTINE HCL 0.1 % NA SOLN: 2.0000 | Freq: Two times a day (BID) | NASAL | 2 refills | Status: DC

## 2016-06-07 NOTE — Patient Instructions (Signed)
Please take flonase 2 sprays per nostril at bedtime.  Please use astelin 2 sprays per nostril in the morning and the evening.  Please use cough syrup every 8 hours as needed for cough and congestion.  Please use cepachol as needed for sore throat  Please start taking claritin, zyrtec, or allegra once daily.  If desired you can take zantac or pepcid over the counter to help with extra drying of the congestion.  You may also take 50 mg of benadryl at bedtime to help with sleeping and for extra drying of congestion.

## 2016-06-07 NOTE — Progress Notes (Signed)
HPI  Patient presents to the office for evaluation of cough.  It has been going on for 3 days.  Patient reports night > day, dry, worse with lying down.  They also endorse change in voice, postnasal drip and nasal congestion, yellow nasal sputum, ear congestion, mild sore scratchy throat.  .  They have tried none.  They report that nothing has worked.  They admits to other sick contacts.  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    Review of Systems  Constitutional: Positive for malaise/fatigue. Negative for chills and fever.  HENT: Positive for congestion, ear pain, hearing loss and sore throat.   Respiratory: Positive for cough. Negative for sputum production, shortness of breath and wheezing.   Cardiovascular: Negative for chest pain, palpitations and leg swelling.  Neurological: Positive for headaches.    PE:  Vitals:   06/07/16 1500  BP: 126/64  Pulse: 98  Resp: 16  Temp: 97.8 F (36.6 C)    General:  Alert and non-toxic, WDWN, NAD HEENT: NCAT, PERLA, EOM normal, no occular discharge or erythema.  Nasal mucosal edema with sinus tenderness to palpation.  Oropharynx clear with minimal oropharyngeal edema and erythema.  Mucous membranes moist and pink. Neck:  Cervical adenopathy Chest:  RRR no MRGs.  Lungs clear to auscultation A&P with no wheezes rhonchi or rales.   Abdomen: +BS x 4 quadrants, soft, non-tender, no guarding, rigidity, or rebound. Skin: warm and dry no rash Neuro: A&Ox4, CN II-XII grossly intact  Assessment and Plan:   1. 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  Dispense: 16 g; Refill: 0 - azelastine (ASTELIN) 0.1 % nasal spray; Place 2 sprays into both nostrils 2 (two) times daily. Use in each nostril as directed  Dispense: 30 mL; Refill: 2 -  promethazine-dextromethorphan (PROMETHAZINE-DM) 6.25-15 MG/5ML syrup; Take 5 mLs by mouth 4 (four) times daily as needed for cough.  Dispense: 240 mL; Refill: 0

## 2016-06-08 DIAGNOSIS — Z713 Dietary counseling and surveillance: Secondary | ICD-10-CM | POA: Diagnosis not present

## 2016-06-09 ENCOUNTER — Encounter: Payer: Self-pay | Admitting: Internal Medicine

## 2016-06-16 DIAGNOSIS — R350 Frequency of micturition: Secondary | ICD-10-CM | POA: Diagnosis not present

## 2016-06-16 DIAGNOSIS — R300 Dysuria: Secondary | ICD-10-CM | POA: Diagnosis not present

## 2016-06-16 DIAGNOSIS — N760 Acute vaginitis: Secondary | ICD-10-CM | POA: Diagnosis not present

## 2016-06-21 DIAGNOSIS — R102 Pelvic and perineal pain: Secondary | ICD-10-CM | POA: Diagnosis not present

## 2016-06-21 DIAGNOSIS — Z30431 Encounter for routine checking of intrauterine contraceptive device: Secondary | ICD-10-CM | POA: Diagnosis not present

## 2016-07-15 DIAGNOSIS — Z713 Dietary counseling and surveillance: Secondary | ICD-10-CM | POA: Diagnosis not present

## 2016-07-28 DIAGNOSIS — F411 Generalized anxiety disorder: Secondary | ICD-10-CM | POA: Diagnosis not present

## 2016-08-02 DIAGNOSIS — F411 Generalized anxiety disorder: Secondary | ICD-10-CM | POA: Diagnosis not present

## 2016-08-09 DIAGNOSIS — F411 Generalized anxiety disorder: Secondary | ICD-10-CM | POA: Diagnosis not present

## 2016-08-16 DIAGNOSIS — F411 Generalized anxiety disorder: Secondary | ICD-10-CM | POA: Diagnosis not present

## 2016-08-17 DIAGNOSIS — Z713 Dietary counseling and surveillance: Secondary | ICD-10-CM | POA: Diagnosis not present

## 2016-08-24 DIAGNOSIS — F411 Generalized anxiety disorder: Secondary | ICD-10-CM | POA: Diagnosis not present

## 2016-08-24 IMAGING — US US PELVIS COMPLETE
1 series · 13 of 25 positions shown · non-contrast
Comparison: None

CLINICAL DATA: Severe pelvic pain after placement of intrauterine
device. Initial encounter.

EXAM:
TRANSABDOMINAL AND TRANSVAGINAL ULTRASOUND OF PELVIS
TECHNIQUE: Both transabdominal and transvaginal ultrasound examinations of the
pelvis were performed. Transabdominal technique was performed for
global imaging of the pelvis including uterus, ovaries, adnexal
regions, and pelvic cul-de-sac. It was necessary to proceed with
endovaginal exam following the transabdominal exam to visualize the
uterus and ovaries in greater detail.

[Series 1: us pelvis complete · 13 of 51 slices shown]
[im 1/51]
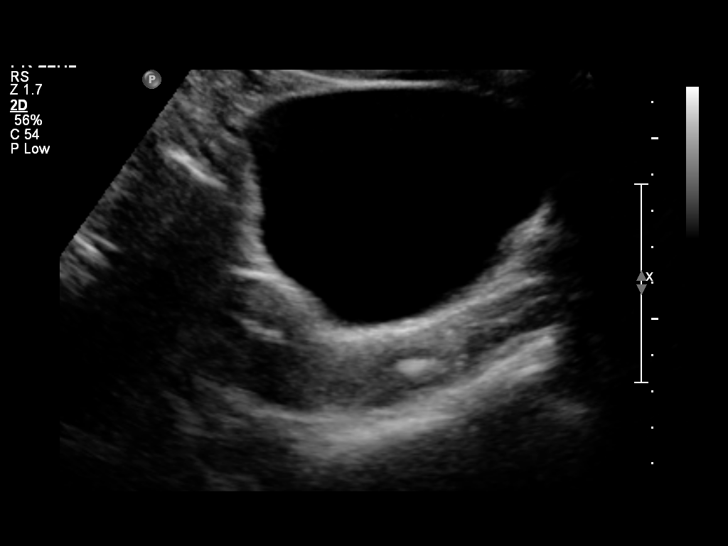
[im 5/51]
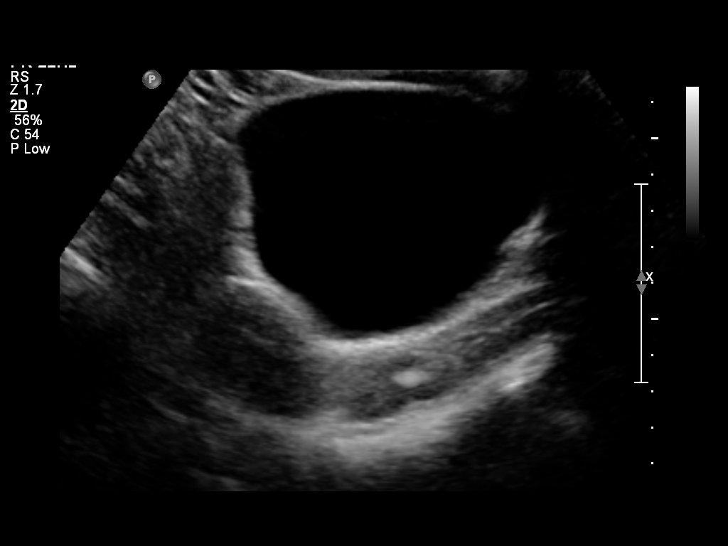
[im 9/51]
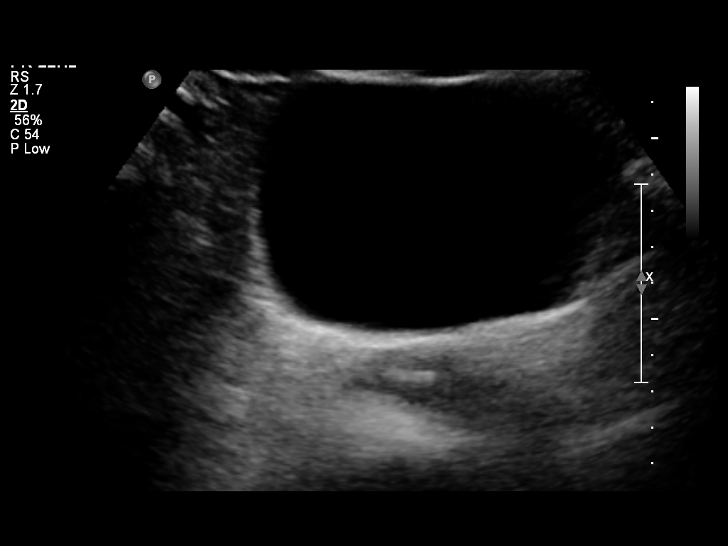
[im 13/51]
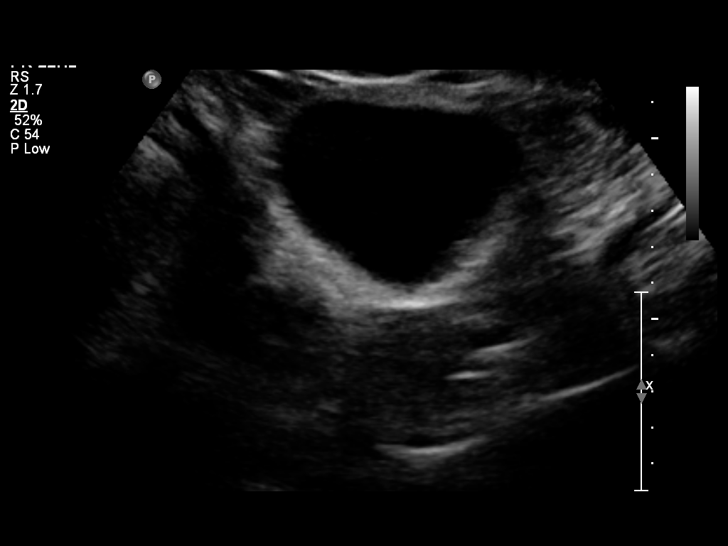
[im 17/51]
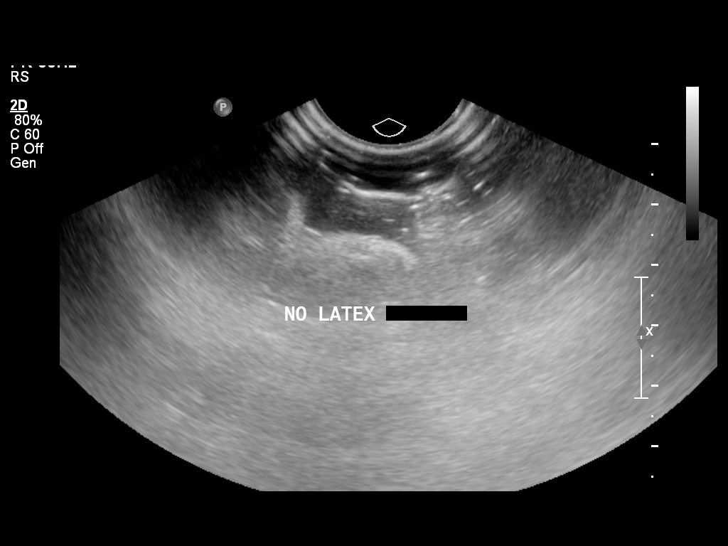
[im 21/51]
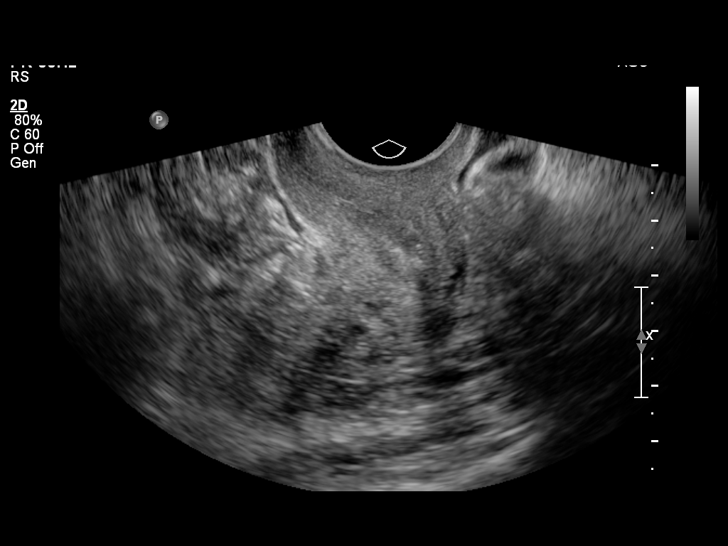
[im 26/51]
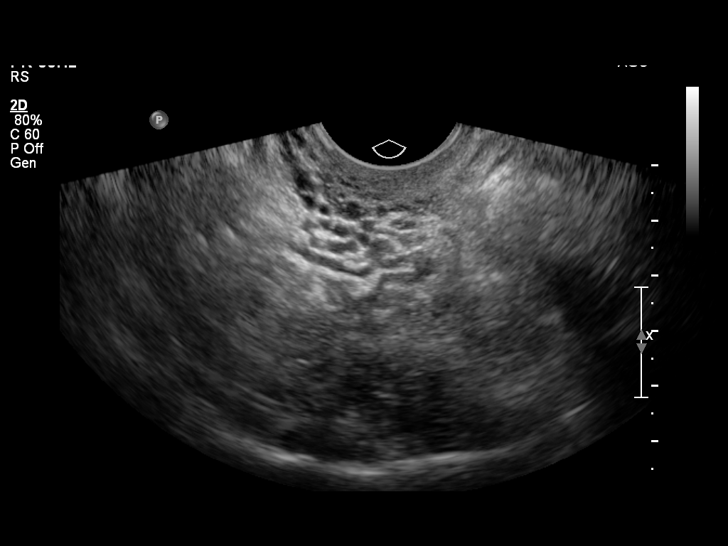
[im 30/51]
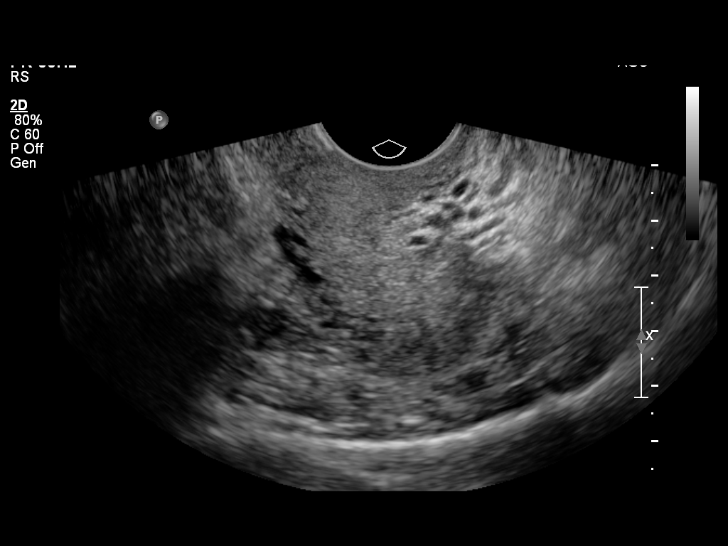
[im 34/51]
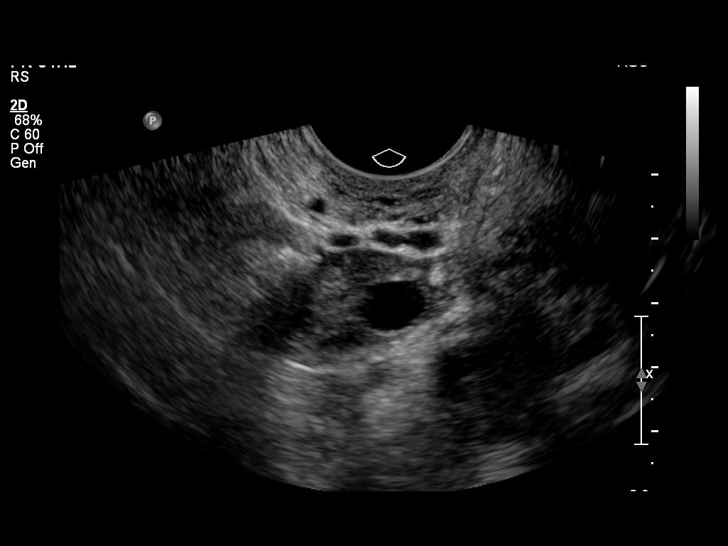
[im 38/51]
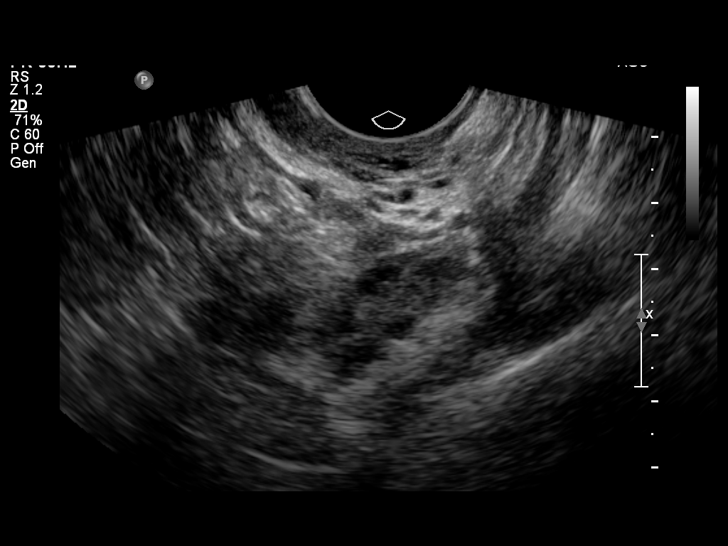
[im 42/51]
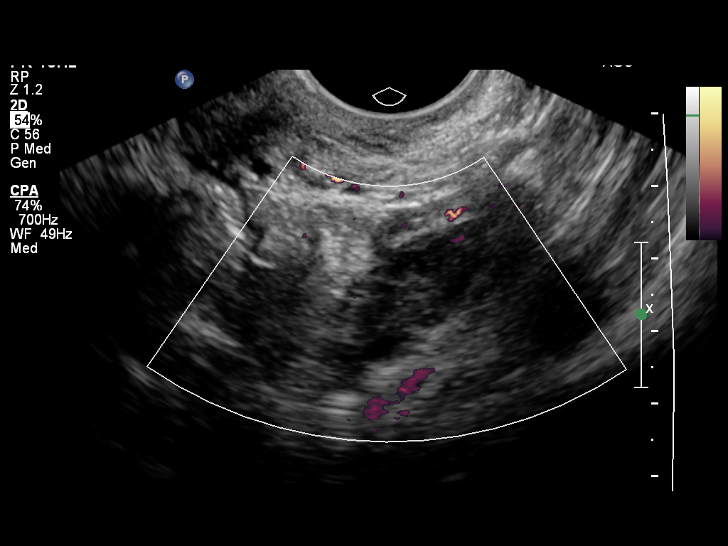
[im 46/51]
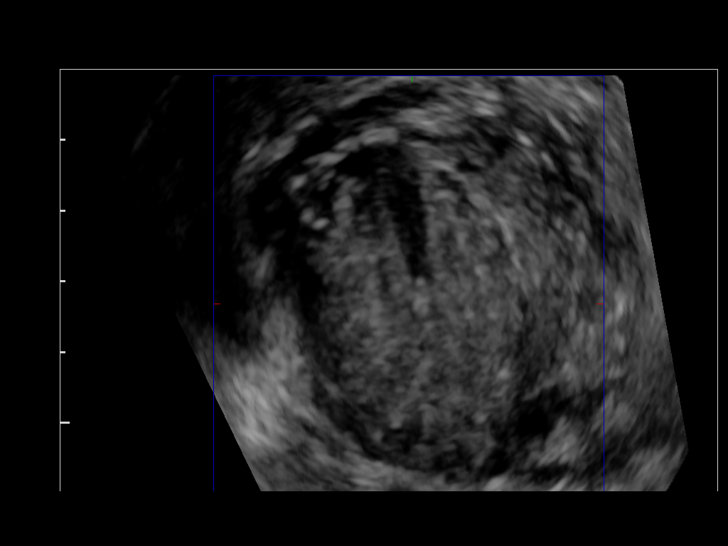
[im 51/51]
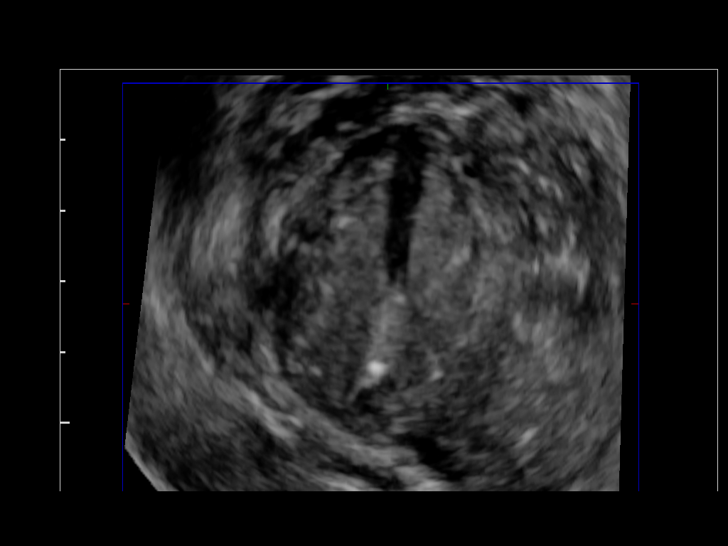

[13 of 25 positions shown; findings below may reference images not displayed]

FINDINGS: Uterus

Measurements: 5.0 x 2.9 x 3.6 cm. No fibroids or other mass
visualized.

Endometrium

Not visualized due to the patient's intrauterine device. The
intrauterine device is noted in expected position at the fundus of
the uterus, per correlation with technologist visualization, though
this is not well seen on 3D images.

Right ovary

Measurements: 3.0 x 1.7 x 2.5 cm. Normal appearance/no adnexal mass.

Left ovary

Measurements: 2.4 x 1.3 x 1.6 cm. Normal appearance/no adnexal mass.

Other findings

No free fluid is seen within the pelvic cul-de-sac.
IMPRESSION: Intrauterine device noted in expected position at the fundus of the
uterus. Unremarkable pelvic ultrasound.

## 2016-08-31 DIAGNOSIS — F411 Generalized anxiety disorder: Secondary | ICD-10-CM | POA: Diagnosis not present

## 2016-08-31 IMAGING — CR DG ABDOMEN 2V
2 series · 2 of 2 positions shown · non-contrast
Comparison: 06/03/2014

CLINICAL DATA: Constipation for 7 days

EXAM:
ABDOMEN - 2 VIEW

[view not recorded (1 of 2)]
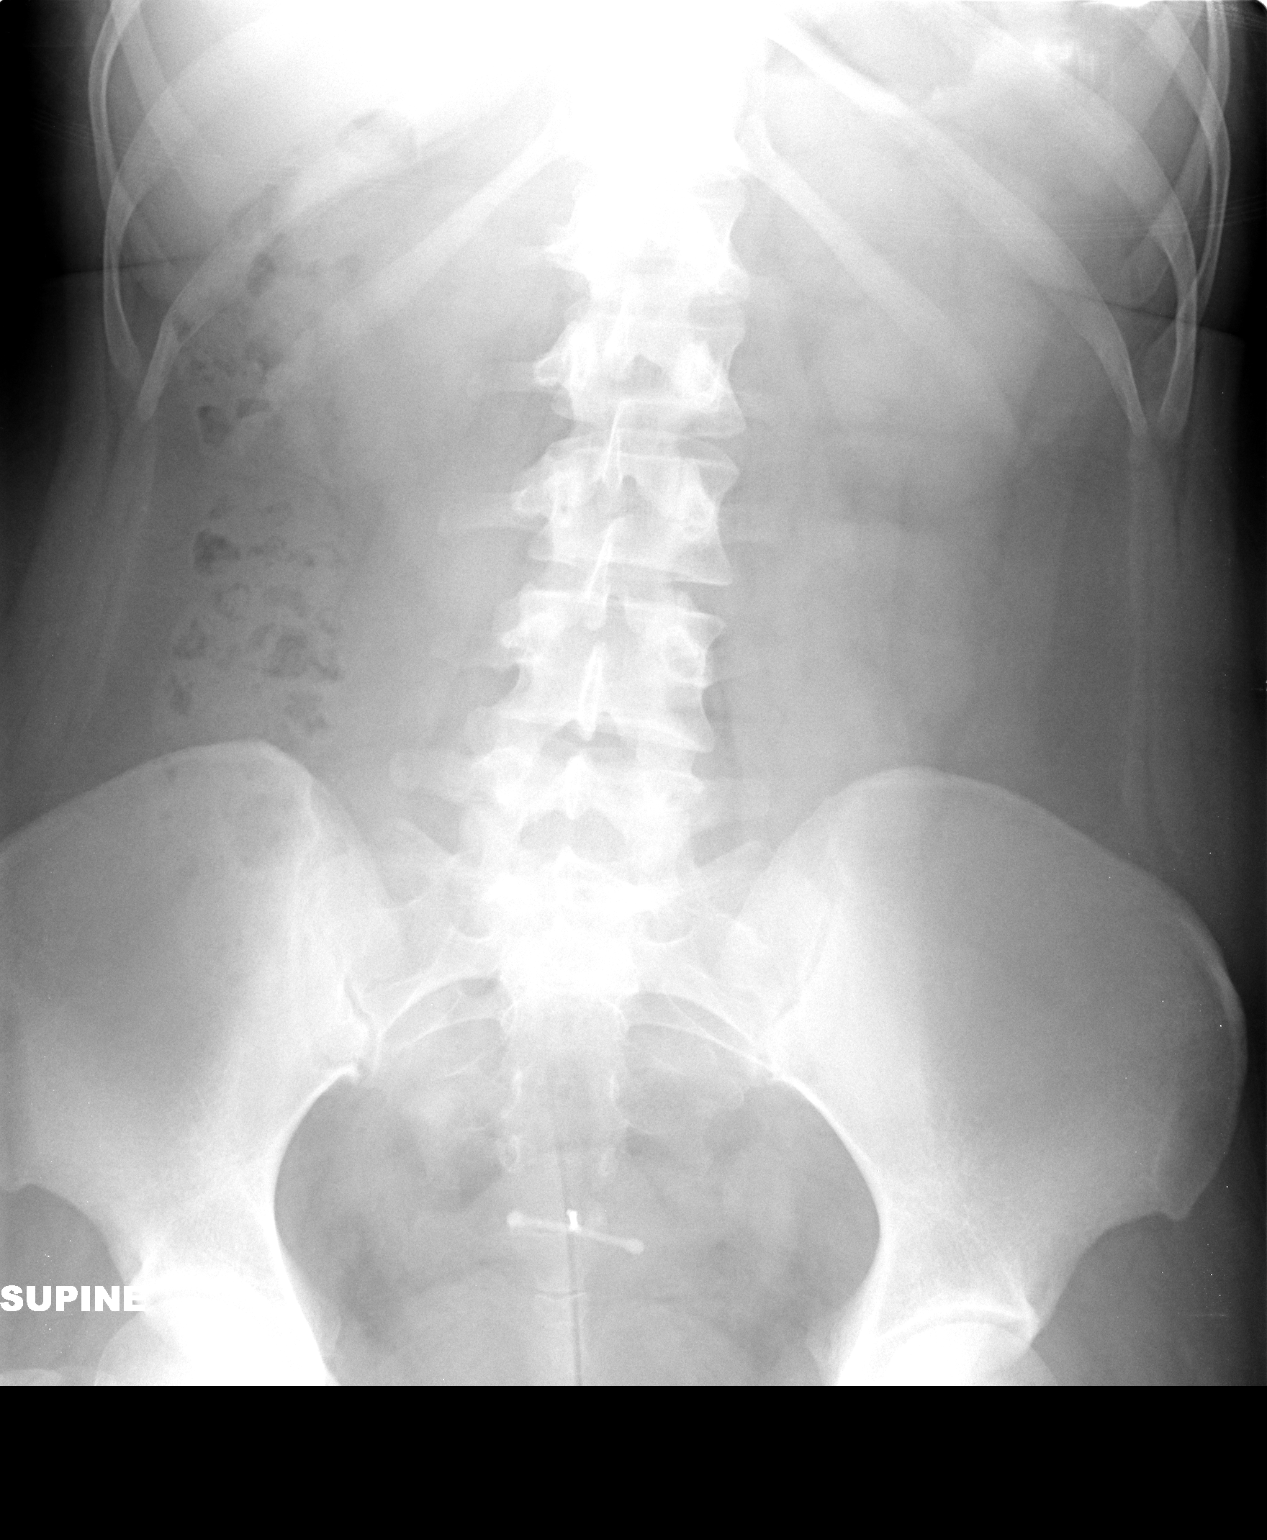

[view not recorded (2 of 2)]
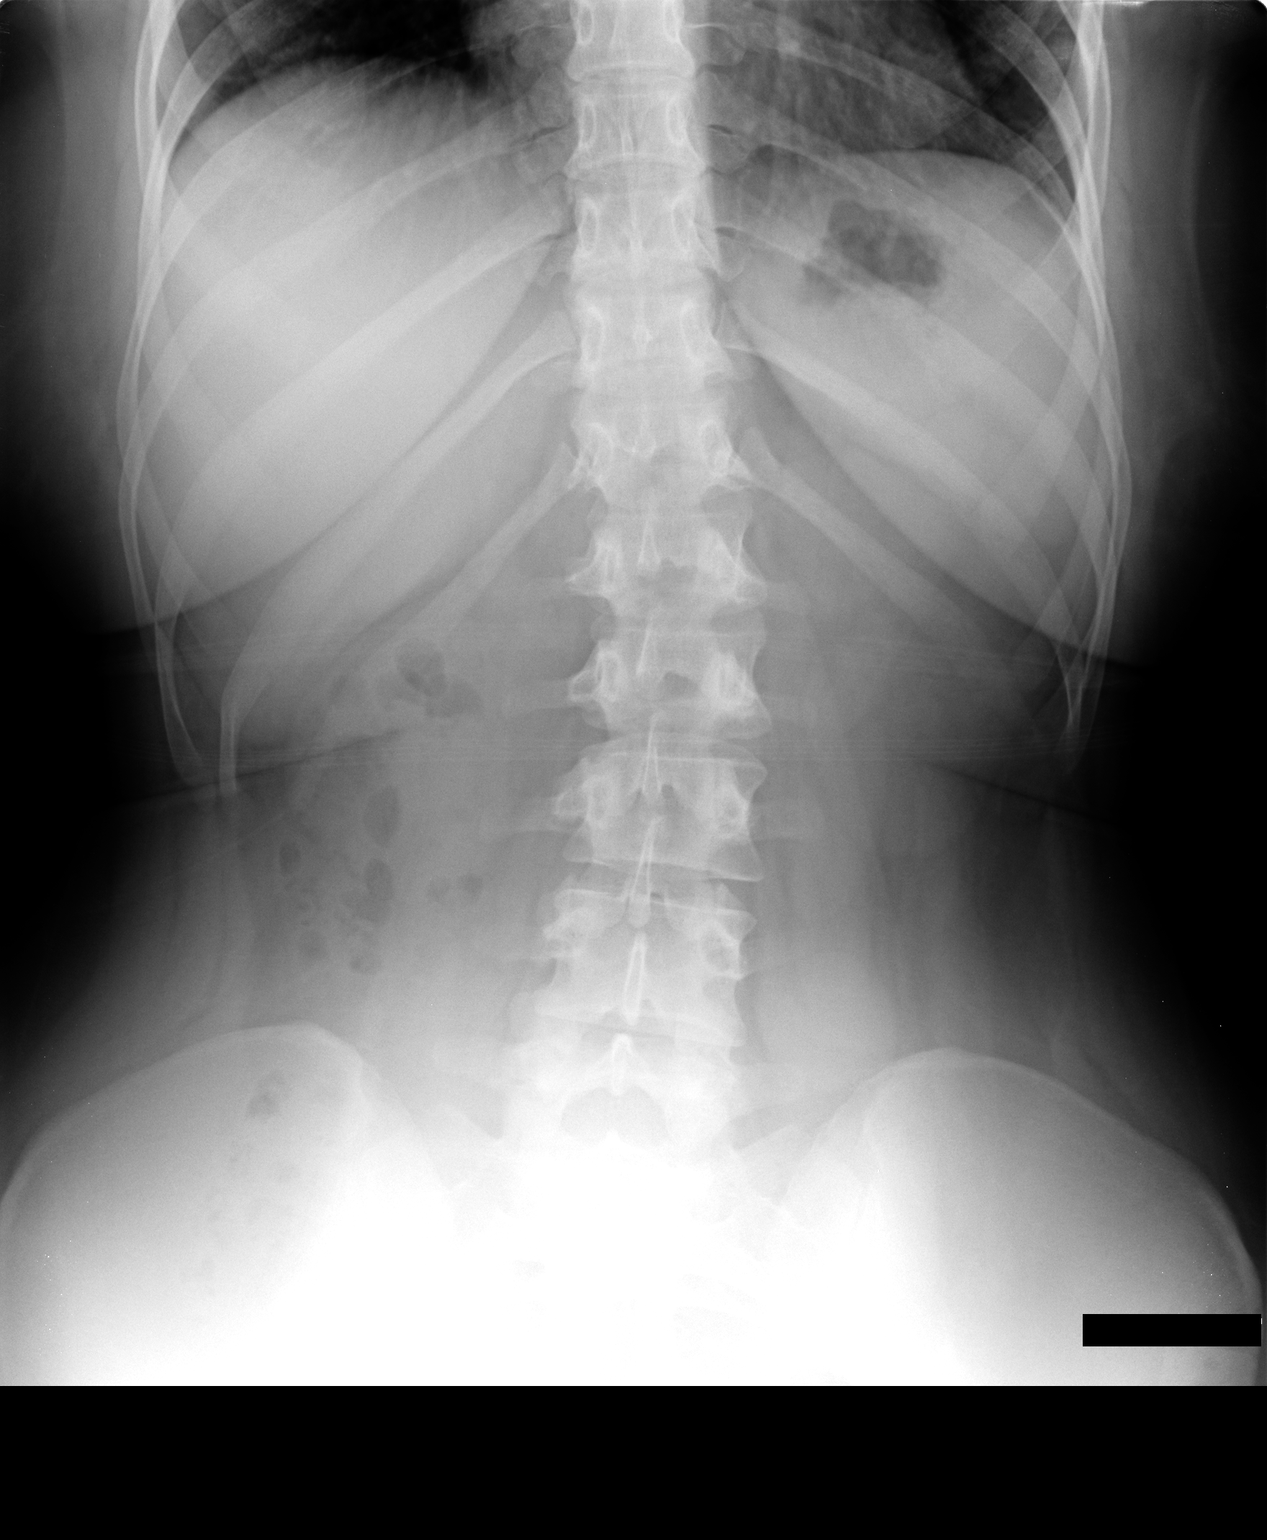

[2 of 2 positions shown; findings below may reference images not displayed]

FINDINGS: The bowel gas pattern is normal. There is no evidence of free air.
No radio-opaque calculi or other significant radiographic
abnormality is seen. IUD noted in the pelvis.
IMPRESSION: Negative.

## 2016-09-07 DIAGNOSIS — F411 Generalized anxiety disorder: Secondary | ICD-10-CM | POA: Diagnosis not present

## 2016-09-14 DIAGNOSIS — F411 Generalized anxiety disorder: Secondary | ICD-10-CM | POA: Diagnosis not present

## 2016-09-21 DIAGNOSIS — F411 Generalized anxiety disorder: Secondary | ICD-10-CM | POA: Diagnosis not present

## 2016-09-23 ENCOUNTER — Ambulatory Visit (INDEPENDENT_AMBULATORY_CARE_PROVIDER_SITE_OTHER): Payer: BLUE CROSS/BLUE SHIELD | Admitting: Physician Assistant

## 2016-09-23 ENCOUNTER — Encounter: Payer: Self-pay | Admitting: Physician Assistant

## 2016-09-23 VITALS — BP 128/82 | HR 80 | Temp 98.2°F | Resp 16 | Ht 63.0 in | Wt 210.0 lb

## 2016-09-23 DIAGNOSIS — E559 Vitamin D deficiency, unspecified: Secondary | ICD-10-CM

## 2016-09-23 DIAGNOSIS — Z131 Encounter for screening for diabetes mellitus: Secondary | ICD-10-CM | POA: Diagnosis not present

## 2016-09-23 DIAGNOSIS — Z79899 Other long term (current) drug therapy: Secondary | ICD-10-CM | POA: Diagnosis not present

## 2016-09-23 DIAGNOSIS — J0101 Acute recurrent maxillary sinusitis: Secondary | ICD-10-CM

## 2016-09-23 DIAGNOSIS — R590 Localized enlarged lymph nodes: Secondary | ICD-10-CM

## 2016-09-23 DIAGNOSIS — Z13220 Encounter for screening for lipoid disorders: Secondary | ICD-10-CM

## 2016-09-23 DIAGNOSIS — D649 Anemia, unspecified: Secondary | ICD-10-CM

## 2016-09-23 LAB — HEPATIC FUNCTION PANEL
ALBUMIN: 4.3 g/dL (ref 3.6–5.1)
ALK PHOS: 97 U/L (ref 33–115)
ALT: 12 U/L (ref 6–29)
AST: 18 U/L (ref 10–30)
Bilirubin, Direct: 0.2 mg/dL (ref <=0.2)
Indirect Bilirubin: 0.3 mg/dL (ref 0.2–1.2)
TOTAL PROTEIN: 6.9 g/dL (ref 6.1–8.1)
Total Bilirubin: 0.5 mg/dL (ref 0.2–1.2)

## 2016-09-23 LAB — CBC WITH DIFFERENTIAL/PLATELET
Basophils Absolute: 84 cells/uL (ref 0–200)
Basophils Relative: 1 %
Eosinophils Absolute: 672 cells/uL — ABNORMAL HIGH (ref 15–500)
Eosinophils Relative: 8 %
HCT: 43.2 % (ref 35.0–45.0)
Hemoglobin: 14.8 g/dL (ref 11.7–15.5)
Lymphocytes Relative: 33 %
Lymphs Abs: 2772 cells/uL (ref 850–3900)
MCH: 31.7 pg (ref 27.0–33.0)
MCHC: 34.3 g/dL (ref 32.0–36.0)
MCV: 92.5 fL (ref 80.0–100.0)
MPV: 9.7 fL (ref 7.5–12.5)
Monocytes Absolute: 840 cells/uL (ref 200–950)
Monocytes Relative: 10 %
Neutro Abs: 4032 cells/uL (ref 1500–7800)
Neutrophils Relative %: 48 %
Platelets: 325 10*3/uL (ref 140–400)
RBC: 4.67 MIL/uL (ref 3.80–5.10)
RDW: 13 % (ref 11.0–15.0)
WBC: 8.4 10*3/uL (ref 3.8–10.8)

## 2016-09-23 LAB — BASIC METABOLIC PANEL WITH GFR
BUN: 10 mg/dL (ref 7–25)
CO2: 25 mmol/L (ref 20–31)
CREATININE: 0.57 mg/dL (ref 0.50–1.10)
Calcium: 9.1 mg/dL (ref 8.6–10.2)
Chloride: 107 mmol/L (ref 98–110)
GFR, Est Non African American: >89 mL/min (ref >=60)
GLUCOSE: 87 mg/dL (ref 65–99)
POTASSIUM: 4.1 mmol/L (ref 3.5–5.3)
Sodium: 141 mmol/L (ref 135–146)

## 2016-09-23 LAB — IRON AND TIBC
%SAT: 23 % (ref 11–50)
Iron: 80 ug/dL (ref 40–190)
TIBC: 345 ug/dL (ref 250–450)
UIBC: 265 ug/dL (ref 125–400)

## 2016-09-23 LAB — LIPID PANEL
Cholesterol: 131 mg/dL (ref <200)
HDL: 54 mg/dL (ref >50)
LDL Cholesterol: 64 mg/dL (ref <100)
Total CHOL/HDL Ratio: 2.4 Ratio (ref <5.0)
Triglycerides: 66 mg/dL (ref <150)
VLDL: 13 mg/dL (ref <30)

## 2016-09-23 LAB — MAGNESIUM: Magnesium: 2 mg/dL (ref 1.5–2.5)

## 2016-09-23 LAB — TSH: TSH: 3.55 mIU/L

## 2016-09-23 MED ORDER — DEXAMETHASONE 0.5 MG PO TABS: ORAL_TABLET | ORAL | 0 refills | Status: DC

## 2016-09-23 MED ORDER — AZITHROMYCIN 250 MG PO TABS: ORAL_TABLET | ORAL | 1 refills | Status: AC

## 2016-09-23 NOTE — Patient Instructions (Signed)
Make sure you are on an allergy pill, see below for more details. Please take the prednisone as directed below, this is NOT an antibiotic so you do NOT have to finish it. You can take it for a few days and stop it if you are doing better.   Please take the prednisone to help decrease inflammation and therefore decrease symptoms. Take it it with food to avoid GI upset. It can cause increased energy but on the other hand it can make it hard to sleep at night so please take it AT NIGHT WITH DINNER, it takes 8-12 hours to start working so it will NOT affect your sleeping if you take it at night with your food!!  If you are diabetic it will increase your sugars so decrease carbs and monitor your sugars closely.      Lymphadenopathy Lymphadenopathy refers to swollen or enlarged lymph glands, also called lymph nodes. Lymph glands are part of your body's defense (immune) system, which protects the body from infections, germs, and diseases. Lymph glands are found in many locations in your body, including the neck, underarm, and groin. Many things can cause lymph glands to become enlarged. When your immune system responds to germs, such as viruses or bacteria, infection-fighting cells and fluid build up. This causes the glands to grow in size. Usually, this is not something to worry about. The swelling and any soreness often go away without treatment. However, swollen lymph glands can also be caused by a number of diseases. Your health care provider may do various tests to help determine the cause. If the cause of your swollen lymph glands cannot be found, it is important to monitor your condition to make sure the swelling goes away. Follow these instructions at home: Watch your condition for any changes. The following actions may help to lessen any discomfort you are feeling:  Get plenty of rest.  Take medicines only as directed by your health care provider. Your health care provider may recommend  over-the-counter medicines for pain.  Apply moist heat compresses to the site of swollen lymph nodes as directed by your health care provider. This can help reduce any pain.  Check your lymph nodes daily for any changes.  Keep all follow-up visits as directed by your health care provider. This is important. Contact a health care provider if:  Your lymph nodes are still swollen after 2 weeks.  Your swelling increases or spreads to other areas.  Your lymph nodes are hard, seem fixed to the skin, or are growing rapidly.  Your skin over the lymph nodes is red and inflamed.  You have a fever.  You have chills.  You have fatigue.  You develop a sore throat.  You have abdominal pain.  You have weight loss.  You have night sweats. Get help right away if:  You notice fluid leaking from the area of the enlarged lymph node.  You have severe pain in any area of your body.  You have chest pain.  You have shortness of breath. This information is not intended to replace advice given to you by your health care provider. Make sure you discuss any questions you have with your health care provider. Document Released: 03/15/2008 Document Revised: 11/12/2015 Document Reviewed: 01/09/2014 Elsevier Interactive Patient Education  2017 [employer].

## 2016-09-23 NOTE — Progress Notes (Signed)
Subjective:    Patient ID: Lori West, female    DOB: 09-27-1995, 21 y.o.   MRN: [id number]  HPI 21 y.o. WF presents with cold.  Has had right cervical lymphadenopathy x 1 month, getting worse.  She has had cold symptoms x 4 days, did have some allergy symptoms but went away with nasal sprays and sudafed. She has bilateral ear pressure, sore throat, sinus pressure, nonproductive mild cough, denies fever, chills.   Patient has been having a hard time with weight loss, would like labs checked, last visit did not get since patient has needle phobia.  BMI is Body mass index is 37.2 kg/m., she is working on diet and exercise. Wt Readings from Last 3 Encounters:  09/23/16 210 lb (95.3 kg)  04/13/16 198 lb 6.4 oz (90 kg)  04/05/16 198 lb 3.2 oz (89.9 kg)   Lab Results  Component Value Date   HGBA1C 4.9 08/29/2014   Lab Results  Component Value Date   CHOL 136 08/29/2014   HDL 48 08/29/2014   LDLCALC 79 08/29/2014   TRIG 46 08/29/2014   CHOLHDL 2.8 08/29/2014    Blood pressure 128/82, pulse 80, temperature 98.2 F (36.8 C), temperature source Temporal, resp. rate 16, height  (1.6 m), weight 210 lb (95.3 kg).  Medications Current Outpatient Prescriptions on File Prior to Visit  Medication Sig  . Albuterol Sulfate (PROAIR RESPICLICK) 108 (90 Base) MCG/ACT AEPB Inhale 2 puffs into the lungs every 4 (four) hours as needed.  Marland Kitchen azelastine (ASTELIN) 0.1 % nasal spray Place 2 sprays into both nostrils 2 (two) times daily. Use in each nostril as directed  . fluticasone (FLONASE) 50 MCG/ACT nasal spray Place 2 sprays into both nostrils daily.   No current facility-administered medications on file prior to visit.     Problem list She has Morbid obesity (BMI 34+); Intrinsic asthma; Migraine with aura and without status migrainosus, not intractable; Mild persistent asthma; Chronic rhinitis; Cyst of skin; and History of keloid of skin on her problem list.  Review of Systems   Constitutional: Positive for fatigue and unexpected weight change (weight gain). Negative for activity change, appetite change, chills, diaphoresis and fever.  HENT: Positive for congestion, ear pain, rhinorrhea, sinus pressure and sore throat. Negative for dental problem, ear discharge, facial swelling, hearing loss, mouth sores, nosebleeds, postnasal drip, sinus pain, sneezing, tinnitus, trouble swallowing and voice change.   Respiratory: Positive for cough. Negative for chest tightness, shortness of breath and wheezing.   Cardiovascular: Negative.   Gastrointestinal: Negative.   Genitourinary: Negative.   Musculoskeletal: Negative.   Neurological: Negative.   Psychiatric/Behavioral: Negative.        Objective:   Physical Exam  Constitutional: She is oriented to person, place, and time. She appears well-developed and well-nourished.  HENT:  Right Ear: Hearing and external ear normal. No mastoid tenderness. Tympanic membrane is injected. Tympanic membrane is not perforated, not erythematous, not retracted and not bulging. A middle ear effusion is present.  Left Ear: Hearing and external ear normal. No mastoid tenderness. Tympanic membrane is injected. Tympanic membrane is not perforated, not erythematous, not retracted and not bulging. A middle ear effusion is present.  Nose: Right sinus exhibits no maxillary sinus tenderness. Left sinus exhibits no maxillary sinus tenderness.  Mouth/Throat: Uvula is midline, oropharynx is clear and moist and mucous membranes are normal.  Eyes: Conjunctivae and EOM are normal. Pupils are equal, round, and reactive to light.  Neck: Neck supple.  Cardiovascular: Normal  rate and regular rhythm.   Pulmonary/Chest: Effort normal and breath sounds normal. No respiratory distress. She has no wheezes.  Abdominal: Soft. Bowel sounds are normal.  Musculoskeletal: Normal range of motion.  Lymphadenopathy:    She has cervical adenopathy (right anterior tender to  palpation).  Neurological: She is alert and oriented to person, place, and time.  Skin: Skin is warm and dry.      Assessment & Plan:   Lymphadenopathy of right cervical region If not better will get [place] -     azithromycin (ZITHROMAX) 250 MG tablet; Take 2 tablets (500 mg) on  Day 1,  followed by 1 tablet (250 mg) once daily on Days 2 through 5. -     dexamethasone (DECADRON) 0.5 MG tablet; then take 2 tablets PO QHS for 3 days, then take 1 tablet QHS for 4 days. -     CBC with Differential/Platelet  Acute recurrent maxillary sinusitis [J01.01] -     azithromycin (ZITHROMAX) 250 MG tablet; Take 2 tablets (500 mg) on  Day 1,  followed by 1 tablet (250 mg) once daily on Days 2 through 5. -     dexamethasone (DECADRON) 0.5 MG tablet; then take 2 tablets PO QHS for 3 days, then take 1 tablet QHS for 4 days.  Medication management -     BASIC METABOLIC PANEL WITH GFR -     Hepatic function panel -     Magnesium  Morbid obesity (BMI 34+) - long discussion about weight loss, diet, and exercise -     TSH  Screening cholesterol level -     Lipid panel  Screening for diabetes mellitus -     Hemoglobin A1c  Anemia, unspecified type -     Methylmalonic acid, serum -     Vitamin B12 -     Iron and TIBC -     Vitamin B1   Future Appointments Date Time Provider Department Center  10/24/2016 9:30 AM Quentin Mulling, PA-C [facility] None

## 2016-09-24 LAB — HEMOGLOBIN A1C
Hgb A1c MFr Bld: 4.4 % (ref <5.7)
MEAN PLASMA GLUCOSE: 80 mg/dL

## 2016-09-24 LAB — VITAMIN B12: VITAMIN B 12: 328 pg/mL (ref 200–1100)

## 2016-09-27 LAB — METHYLMALONIC ACID, SERUM: METHYLMALONIC ACID, QUANT: 151 nmol/L (ref 87–318)

## 2016-09-28 DIAGNOSIS — F411 Generalized anxiety disorder: Secondary | ICD-10-CM | POA: Diagnosis not present

## 2016-09-28 LAB — VITAMIN B1: VITAMIN B1 (THIAMINE): 26 nmol/L (ref 8–30)

## 2016-10-14 ENCOUNTER — Encounter: Payer: Self-pay | Admitting: Physician Assistant

## 2016-10-14 ENCOUNTER — Ambulatory Visit (INDEPENDENT_AMBULATORY_CARE_PROVIDER_SITE_OTHER): Payer: BLUE CROSS/BLUE SHIELD | Admitting: Physician Assistant

## 2016-10-14 VITALS — BP 120/86 | HR 86 | Temp 97.5°F | Resp 14 | Ht 63.0 in | Wt 205.8 lb

## 2016-10-14 DIAGNOSIS — J0101 Acute recurrent maxillary sinusitis: Secondary | ICD-10-CM

## 2016-10-14 DIAGNOSIS — R590 Localized enlarged lymph nodes: Secondary | ICD-10-CM

## 2016-10-14 NOTE — Patient Instructions (Signed)
Get back on decadron Take zpak Get on probiotic   Continue everything else that you are doing  HOW TO TREAT VIRAL COUGH AND COLD SYMPTOMS:  -Symptoms usually last at least 1 week with the worst symptoms being around day 4.  - colds usually start with a sore throat and end with a cough, and the cough can take 2 weeks to get better.  -No antibiotics are needed for colds, flu, sore throats, cough, bronchitis UNLESS symptoms are longer than 7 days OR if you are getting better then get drastically worse.  -There are a lot of combination medications (Dayquil, Nyquil, Vicks 44, tyelnol cold and sinus, ETC). Please look at the ingredients on the back so that you are treating the correct symptoms and not doubling up on medications/ingredients.    Medicines you can use  Nasal congestion  - pseudoephedrine (Sudafed)- behind the counter, do not use if you have high blood pressure, medicine that have -D in them.  - phenylephrine (Sudafed PE) -Dextormethorphan + chlorpheniramine (Coridcidin HBP)- okay if you have high blood pressure -Oxymetazoline (Afrin) nasal spray- LIMIT to 3 days -Saline nasal spray -Neti pot (used distilled or bottled water)  Ear pain/congestion  -pseudoephedrine (sudafed) - Nasonex/flonase nasal spray  Fever  -Acetaminophen (Tyelnol) -Ibuprofen (Advil, motrin, aleve)  Sore Throat  -Acetaminophen (Tyelnol) -Ibuprofen (Advil, motrin, aleve) -Drink a lot of water -Gargle with salt water - Rest your voice (don't talk) -Throat sprays -Cough drops  Body Aches  -Acetaminophen (Tyelnol) -Ibuprofen (Advil, motrin, aleve)  Headache  -Acetaminophen (Tyelnol) -Ibuprofen (Advil, motrin, aleve) - Exedrin, Exedrin Migraine  Allergy symptoms (cough, sneeze, runny nose, itchy eyes) -Claritin or loratadine cheapest but likely the weakest  -Zyrtec or certizine at night because it can make you sleepy -The strongest is allegra or fexafinadine  Cheapest at [employer], [employer],  [employer]  Cough  -Dextromethorphan (Delsym)- medicine that has DM in it -Guafenesin (Mucinex/Robitussin) - cough drops - drink lots of water  Chest Congestion  -Guafenesin (Mucinex/Robitussin)  Red Itchy Eyes  - Naphcon-A  Upset Stomach  - Bland diet (nothing spicy, greasy, fried, and high acid foods like tomatoes, oranges, berries) -OKAY- cereal, bread, soup, crackers, rice -Eat smaller more frequent meals -reduce caffeine, no alcohol -Loperamide (Imodium-AD) if diarrhea -Prevacid for heart burn  General health when sick  -Hydration -wash your hands frequently -keep surfaces clean -change pillow cases and sheets often -Get fresh air but do not exercise strenuously -Vitamin D, double up on it - Vitamin C -Zinc

## 2016-10-14 NOTE — Progress Notes (Signed)
Subjective:    Patient ID: Lori West, female    DOB: 14-Feb-1996, 21 y.o.   MRN: [id number]  HPI 21 y.o. WF presents with cold X 3 DAYS.  Treated for cold and right cervical lymphadenopathy 04/04, did not take zpak. States did get better but has been back x Tuesday with the lymphadenopathy.  She has bilateral ear pressure, sore throat, sinus pressure, nonproductive mild cough, denies fever, chills.   BMI is Body mass index is 36.46 kg/m., she is working on diet and exercise. Wt Readings from Last 3 Encounters:  10/14/16 205 lb 12.8 oz (93.4 kg)  09/23/16 210 lb (95.3 kg)  04/13/16 198 lb 6.4 oz (90 kg)   Lab Results  Component Value Date   HGBA1C 4.4 09/23/2016   Lab Results  Component Value Date   CHOL 131 09/23/2016   HDL 54 09/23/2016   LDLCALC 64 09/23/2016   TRIG 66 09/23/2016   CHOLHDL 2.4 09/23/2016    Blood pressure 120/86, pulse 86, temperature 97.5 F (36.4 C), resp. rate 14, height  (1.6 m), weight 205 lb 12.8 oz (93.4 kg), SpO2 97 %.  Medications Current Outpatient Prescriptions on File Prior to Visit  Medication Sig  . Albuterol Sulfate (PROAIR RESPICLICK) 108 (90 Base) MCG/ACT AEPB Inhale 2 puffs into the lungs every 4 (four) hours as needed.  Marland Kitchen azelastine (ASTELIN) 0.1 % nasal spray Place 2 sprays into both nostrils 2 (two) times daily. Use in each nostril as directed  . fluticasone (FLONASE) 50 MCG/ACT nasal spray Place 2 sprays into both nostrils daily.  . pseudoephedrine (SUDAFED) 60 MG tablet Take 60 mg by mouth every 4 (four) hours as needed for congestion.   No current facility-administered medications on file prior to visit.     Problem list She has Morbid obesity (BMI 34+); Intrinsic asthma; Migraine with aura and without status migrainosus, not intractable; Mild persistent asthma; Chronic rhinitis; Cyst of skin; and History of keloid of skin on her problem list.  Review of Systems  Constitutional: Positive for fatigue and unexpected  weight change (weight gain). Negative for activity change, appetite change, chills, diaphoresis and fever.  HENT: Positive for congestion, ear pain, rhinorrhea, sinus pressure and sore throat. Negative for dental problem, ear discharge, facial swelling, hearing loss, mouth sores, nosebleeds, postnasal drip, sinus pain, sneezing, tinnitus, trouble swallowing and voice change.   Respiratory: Positive for cough. Negative for chest tightness, shortness of breath and wheezing.   Cardiovascular: Negative.   Gastrointestinal: Negative.   Genitourinary: Negative.   Musculoskeletal: Negative.   Neurological: Negative.   Psychiatric/Behavioral: Negative.        Objective:   Physical Exam  Constitutional: She is oriented to person, place, and time. She appears well-developed and well-nourished.  HENT:  Right Ear: Hearing and external ear normal. No mastoid tenderness. Tympanic membrane is injected. Tympanic membrane is not perforated, not erythematous, not retracted and not bulging. A middle ear effusion is present.  Left Ear: Hearing and external ear normal. No mastoid tenderness. Tympanic membrane is injected. Tympanic membrane is not perforated, not erythematous, not retracted and not bulging. A middle ear effusion is present.  Nose: Right sinus exhibits no maxillary sinus tenderness. Left sinus exhibits no maxillary sinus tenderness.  Mouth/Throat: Uvula is midline, oropharynx is clear and moist and mucous membranes are normal.  Eyes: Conjunctivae and EOM are normal. Pupils are equal, round, and reactive to light.  Neck: Neck supple.  Cardiovascular: Normal rate and regular rhythm.  Pulmonary/Chest: Effort normal and breath sounds normal. No respiratory distress. She has no wheezes.  Abdominal: Soft. Bowel sounds are normal.  Musculoskeletal: Normal range of motion.  Lymphadenopathy:    She has cervical adenopathy (right anterior tender to palpation).  Neurological: She is alert and oriented to  person, place, and time.  Skin: Skin is warm and dry.      Assessment & Plan:   Lymphadenopathy of right cervical region If not better will get [place] -     dexamethasone (DECADRON) 0.5 MG tablet; then take 2 tablets PO QHS for 3 days, then take 1 tablet QHS for 4 days. -    Will get [place] since did not improve   Acute recurrent maxillary sinusitis [J01.01] -     azithromycin (ZITHROMAX) 250 MG tablet; Take 2 tablets (500 mg) on  Day 1,  followed by 1 tablet (250 mg) once daily on Days 2 through 5. -     dexamethasone (DECADRON) 0.5 MG tablet; then take 2 tablets PO QHS for 3 days, then take 1 tablet QHS for 4 days.  Future Appointments Date Time Provider Department Center  10/24/2016 9:30 AM Quentin Mulling, PA-C [facility] None

## 2016-10-18 ENCOUNTER — Ambulatory Visit
Admission: RE | Admit: 2016-10-18 | Discharge: 2016-10-18 | Disposition: A | Discharge status: Home / Self Care | Payer: BLUE CROSS/BLUE SHIELD | Source: Ambulatory Visit | Attending: Physician Assistant | Admitting: Physician Assistant

## 2016-10-18 DIAGNOSIS — E041 Nontoxic single thyroid nodule: Secondary | ICD-10-CM | POA: Diagnosis not present

## 2016-10-18 DIAGNOSIS — R590 Localized enlarged lymph nodes: Secondary | ICD-10-CM

## 2016-10-18 DIAGNOSIS — F411 Generalized anxiety disorder: Secondary | ICD-10-CM | POA: Diagnosis not present

## 2016-10-24 ENCOUNTER — Ambulatory Visit: Payer: Self-pay | Admitting: Physician Assistant

## 2016-10-26 DIAGNOSIS — F411 Generalized anxiety disorder: Secondary | ICD-10-CM | POA: Diagnosis not present

## 2016-10-27 DIAGNOSIS — Z713 Dietary counseling and surveillance: Secondary | ICD-10-CM | POA: Diagnosis not present

## 2016-11-01 DIAGNOSIS — B373 Candidiasis of vulva and vagina: Secondary | ICD-10-CM | POA: Diagnosis not present

## 2016-11-03 DIAGNOSIS — F411 Generalized anxiety disorder: Secondary | ICD-10-CM | POA: Diagnosis not present

## 2016-11-04 DIAGNOSIS — H1045 Other chronic allergic conjunctivitis: Secondary | ICD-10-CM | POA: Diagnosis not present

## 2016-11-07 DIAGNOSIS — F411 Generalized anxiety disorder: Secondary | ICD-10-CM | POA: Diagnosis not present

## 2016-11-15 DIAGNOSIS — Z01419 Encounter for gynecological examination (general) (routine) without abnormal findings: Secondary | ICD-10-CM | POA: Diagnosis not present

## 2016-11-15 DIAGNOSIS — Z6836 Body mass index (BMI) 36.0-36.9, adult: Secondary | ICD-10-CM | POA: Diagnosis not present

## 2016-11-15 DIAGNOSIS — Z113 Encounter for screening for infections with a predominantly sexual mode of transmission: Secondary | ICD-10-CM | POA: Diagnosis not present

## 2016-11-16 DIAGNOSIS — F411 Generalized anxiety disorder: Secondary | ICD-10-CM | POA: Diagnosis not present

## 2016-11-21 DIAGNOSIS — F411 Generalized anxiety disorder: Secondary | ICD-10-CM | POA: Diagnosis not present

## 2016-11-25 DIAGNOSIS — Z713 Dietary counseling and surveillance: Secondary | ICD-10-CM | POA: Diagnosis not present

## 2016-11-30 DIAGNOSIS — F411 Generalized anxiety disorder: Secondary | ICD-10-CM | POA: Diagnosis not present

## 2016-12-05 DIAGNOSIS — F411 Generalized anxiety disorder: Secondary | ICD-10-CM | POA: Diagnosis not present

## 2016-12-14 DIAGNOSIS — F411 Generalized anxiety disorder: Secondary | ICD-10-CM | POA: Diagnosis not present

## 2016-12-20 DIAGNOSIS — F411 Generalized anxiety disorder: Secondary | ICD-10-CM | POA: Diagnosis not present

## 2016-12-28 DIAGNOSIS — F411 Generalized anxiety disorder: Secondary | ICD-10-CM | POA: Diagnosis not present

## 2016-12-30 DIAGNOSIS — Z713 Dietary counseling and surveillance: Secondary | ICD-10-CM | POA: Diagnosis not present

## 2017-01-02 DIAGNOSIS — N760 Acute vaginitis: Secondary | ICD-10-CM | POA: Diagnosis not present

## 2017-01-03 DIAGNOSIS — F411 Generalized anxiety disorder: Secondary | ICD-10-CM | POA: Diagnosis not present

## 2017-01-11 DIAGNOSIS — F411 Generalized anxiety disorder: Secondary | ICD-10-CM | POA: Diagnosis not present

## 2017-01-16 DIAGNOSIS — F411 Generalized anxiety disorder: Secondary | ICD-10-CM | POA: Diagnosis not present

## 2017-01-24 DIAGNOSIS — L739 Follicular disorder, unspecified: Secondary | ICD-10-CM | POA: Diagnosis not present

## 2017-01-24 DIAGNOSIS — D492 Neoplasm of unspecified behavior of bone, soft tissue, and skin: Secondary | ICD-10-CM | POA: Diagnosis not present

## 2017-01-24 DIAGNOSIS — L910 Hypertrophic scar: Secondary | ICD-10-CM | POA: Diagnosis not present

## 2017-02-06 ENCOUNTER — Telehealth: Payer: Self-pay

## 2017-02-06 ENCOUNTER — Other Ambulatory Visit: Payer: Self-pay | Admitting: Physician Assistant

## 2017-02-06 ENCOUNTER — Other Ambulatory Visit: Payer: Self-pay

## 2017-02-06 MED ORDER — AMOXICILLIN-POT CLAVULANATE 875-125 MG PO TABS: 1.0000 | ORAL_TABLET | Freq: Two times a day (BID) | ORAL | 0 refills | Status: DC

## 2017-02-06 NOTE — Telephone Encounter (Signed)
Pt reports pos for strep on Saturday. Was given amoxil and ibuprofen at the [facility]. Still sweats,pain,in neck.  Pt declined meds & wanted to schedule an appt. Pt was transferred to front office & scheduled an appt.

## 2017-02-07 ENCOUNTER — Encounter: Payer: Self-pay | Admitting: Physician Assistant

## 2017-02-07 ENCOUNTER — Other Ambulatory Visit: Payer: Self-pay | Admitting: Physician Assistant

## 2017-02-07 DIAGNOSIS — R0989 Other specified symptoms and signs involving the circulatory and respiratory systems: Secondary | ICD-10-CM | POA: Diagnosis not present

## 2017-02-07 DIAGNOSIS — R079 Chest pain, unspecified: Secondary | ICD-10-CM | POA: Diagnosis not present

## 2017-02-07 DIAGNOSIS — R0789 Other chest pain: Secondary | ICD-10-CM | POA: Diagnosis not present

## 2017-02-07 DIAGNOSIS — F411 Generalized anxiety disorder: Secondary | ICD-10-CM | POA: Diagnosis not present

## 2017-02-07 MED ORDER — AZITHROMYCIN 250 MG PO TABS: ORAL_TABLET | ORAL | 1 refills | Status: DC

## 2017-02-08 ENCOUNTER — Encounter: Payer: Self-pay | Admitting: Internal Medicine

## 2017-02-08 ENCOUNTER — Ambulatory Visit (INDEPENDENT_AMBULATORY_CARE_PROVIDER_SITE_OTHER): Payer: BLUE CROSS/BLUE SHIELD | Admitting: Internal Medicine

## 2017-02-08 VITALS — BP 106/70 | HR 64 | Temp 97.5°F | Resp 16 | Ht 63.0 in | Wt 206.8 lb

## 2017-02-08 DIAGNOSIS — J0410 Acute tracheitis without obstruction: Secondary | ICD-10-CM | POA: Diagnosis not present

## 2017-02-08 DIAGNOSIS — J0100 Acute maxillary sinusitis, unspecified: Secondary | ICD-10-CM | POA: Diagnosis not present

## 2017-02-08 MED ORDER — DEXAMETHASONE 0.5 MG PO TABS: ORAL_TABLET | ORAL | 0 refills | Status: DC

## 2017-02-08 NOTE — Progress Notes (Signed)
  Subjective:    Patient ID: Lori West, female    DOB: 19-Jun-1996, 21 y.o.   MRN: [id number]  HPI  This 21 yo WF presents with a 10-14 day prodrome and apparently on 02/04/17 while visiting friends in [place] she was seen in an Urgent Care and alleges testing (+) for strep and was treated with Augmentin.  5 days ago she developed a pruritic rash and stopped the Augmentin. Yesterday she was [facility] t [facility] and had a Nl CXR and was prescribed a Z-pak. Now she presents today with c/o HA's sinus congestion, and discomfort and "painful " cough productive of a yellowish sputum. She reports fevers, and sweats , but denies rashes.    Medication Sig  . Albuterol Sulfate (PROAIR RESPICLICK) 108 (90 Base) MCG/ACT AEPB Inhale 2 puffs into the lungs every 4 (four) hours as needed.  Marland Kitchen azithromycin (ZITHROMAX) 250 MG tablet Take 2 tablets (500 mg) on  Day 1,  followed by 1 tablet (250 mg) once daily on Days 2 through 5.  . amoxicillin-clavulanate (AUGMENTIN) 875-125 MG tablet Take 1 tablet by mouth 2 (two) times daily.  Marland Kitchen azelastine (ASTELIN) 0.1 % nasal spray Place 2 sprays into both nostrils 2 (two) times daily. Use in each nostril as directed  . fluticasone (FLONASE) 50 MCG/ACT nasal spray Place 2 sprays into both nostrils daily.  . pseudoephedrine (SUDAFED) 60 MG tablet Take 60 mg by mouth every 4 (four) hours as needed for congestion.   Allergies  Allergen Reactions  . Breo Ellipta [Fluticasone Furoate-Vilanterol] Other (See Comments)    Thrush  . Peanut-Containing Drug Products Other (See Comments)    Tingling and numbness in throat from tree nuts  . Prednisone Other (See Comments)    Elevated temperature  . Penicillins Rash    Augmentin  . Alcohol-Sulfur [Sulfur]   . Lactose Intolerance (Gi)     Not a true allergy- causes acne breakouts  . Metronidazole     Other reaction(s): Other (See Comments) Seizures and high temp.  . Toradol [Ketorolac Tromethamine]     Syncope, nausea    Review of Systems  10 point systems review negative except as above.    Objective:   Physical Exam  BP 106/70   Pulse 64   Temp (!) 97.5 F (36.4 C)   Resp 16   Ht 5\' 3"  (1.6 m)   Wt 206 lb 12.8 oz (93.8 kg)   BMI 36.63 kg/m     O2 sat 99%  Barking cough. No Stridor  HEENT -  TM's Nl. (+) bilat Max tenderness. O/P clear. . Neck - supple.  Chest - Few rales . No Rhonchi or Wheezes. Cor - Nl HS. RRR w/o sig MGR. PP 1(+). No edema. MS- FROM w/o deformities.  Gait Nl. Neuro -  Nl w/o focal abnormalities.    Assessment & Plan:   1. Acute maxillary sinusitis, recurrence not specified  - Advised continue her Z-Pak and if sx's persist 5 days beyond completion of Z-Pak to call for refill.  - dexamethasone (DECADRON) 0.5 MG tablet; Take 1 tab 3 x day - 3 days, then 2 x day - 3 days, then 1 tab daily  Dispense: 20 tablet; Refill: 0  2. Tracheitis  - dexamethasone (DECADRON) 0.5 MG tablet; Take 1 tab 3 x day - 3 days, then 2 x day - 3 days, then 1 tab daily  Dispense: 20 tablet; Refill: 0  - Discussed meds & SE's.

## 2017-02-13 DIAGNOSIS — Z713 Dietary counseling and surveillance: Secondary | ICD-10-CM | POA: Diagnosis not present

## 2017-02-14 DIAGNOSIS — F411 Generalized anxiety disorder: Secondary | ICD-10-CM | POA: Diagnosis not present

## 2017-02-22 DIAGNOSIS — F411 Generalized anxiety disorder: Secondary | ICD-10-CM | POA: Diagnosis not present

## 2017-03-08 DIAGNOSIS — F411 Generalized anxiety disorder: Secondary | ICD-10-CM | POA: Diagnosis not present

## 2017-03-15 DIAGNOSIS — F411 Generalized anxiety disorder: Secondary | ICD-10-CM | POA: Diagnosis not present

## 2017-03-20 DIAGNOSIS — F411 Generalized anxiety disorder: Secondary | ICD-10-CM | POA: Diagnosis not present

## 2017-03-22 NOTE — Progress Notes (Deleted)
Assessment and Plan:  Hypertension: Continue medication, monitor blood pressure at home. Continue DASH diet.  Reminder to go to the ER if any CP, SOB, nausea, dizziness, severe HA, changes vision/speech, left arm numbness and tingling, and jaw pain. Cholesterol personal and family history: Continue diet and exercise. Check cholesterol.  Vitamin D Def- check level and continue medications.  Obesity with co morbidities- long discussion about weight loss, diet, and exercise- will check labs Constipation- get on benefiber continue water, exercise, check celiac, ESR, TSH, etc.   Continue diet and meds as discussed. Further disposition pending results of labs.  HPI 21 y.o. female  presents for 3 month follow up for chol, obesity, and AB pain. Her blood pressure has been controlled at home, today their BP is    She does workout 5 days a week with intervals. She denies chest pain, shortness of breath, dizziness.  She is not on cholesterol medication and denies myalgias.She states she has a FM HX of hyperlipidemia and would like testing, has had it elevated in the past.   She has been working on diet and exercise but is still  Having trouble losing weight, she is seeing a nutritionalist, Deatra Ina with [facility].  She states that she continues to have intermittent constipation, no diarrhea, no blood/black stool. + AB cramping intermittently.  BMI is There is no height or weight on file to calculate BMI., she is working on diet and exercise. Wt Readings from Last 3 Encounters:  02/08/17 206 lb 12.8 oz (93.8 kg)  10/14/16 205 lb 12.8 oz (93.4 kg)  09/23/16 210 lb (95.3 kg)  She is on Calcium and Primrose.   Current Medications:  Current Outpatient Prescriptions on File Prior to Visit  Medication Sig Dispense Refill  . Albuterol Sulfate (PROAIR RESPICLICK) 544 (90 Base) MCG/ACT AEPB Inhale 2 puffs into the lungs every 4 (four) hours as needed. 1 each 1  . azithromycin (ZITHROMAX) 250 MG  tablet Take two tablets on the first day and then one tablet every day after.    Marland Kitchen dexamethasone (DECADRON) 0.5 MG tablet Take 1 tab 3 x day - 3 days, then 2 x day - 3 days, then 1 tab daily 20 tablet 0   No current facility-administered medications on file prior to visit.    Medical History:  Past Medical History:  Diagnosis Date  . Anxiety   . Asthma   . C. difficile diarrhea   . Depression   . Gastritis   . Gastropathy    mild reactive  . Headache   . IBS (irritable bowel syndrome)   . Obesity   . Vitamin D deficiency    Allergies:  Allergies  Allergen Reactions  . Breo Ellipta [Fluticasone Furoate-Vilanterol] Other (See Comments)    Thrush  . Peanut-Containing Drug Products Other (See Comments)    Tingling and numbness in throat from tree nuts  . Prednisone Other (See Comments)    Elevated temperature  . Penicillins Rash    Augmentin  . Alcohol-Sulfur [Sulfur]   . Lactose Intolerance (Gi)     Not a true allergy- causes acne breakouts  . Metronidazole     Other reaction(s): Other (See Comments) Seizures and high temp.  . Toradol [Ketorolac Tromethamine]     Syncope, nausea     Review of Systems:  Review of Systems  Constitutional: Negative.   HENT: Negative.   Respiratory: Negative.   Cardiovascular: Negative.   Gastrointestinal: Positive for abdominal pain and constipation. Negative for  blood in stool, diarrhea, heartburn, melena, nausea and vomiting.  Genitourinary: Negative.   Musculoskeletal: Negative.   Skin: Negative.   Neurological: Negative.   Psychiatric/Behavioral: Negative.     Family history- Review and unchanged Social history- Review and unchanged Physical Exam: There were no vitals taken for this visit. Wt Readings from Last 3 Encounters:  02/08/17 206 lb 12.8 oz (93.8 kg)  10/14/16 205 lb 12.8 oz (93.4 kg)  09/23/16 210 lb (95.3 kg)   General Appearance: Well nourished, in no apparent distress. Eyes: PERRLA, EOMs, conjunctiva no  swelling or erythema Sinuses: No Frontal/maxillary tenderness ENT/Mouth: Ext aud canals clear, TMs without erythema, bulging. No erythema, swelling, or exudate on post pharynx.  Tonsils not swollen or erythematous. Hearing normal.  Neck: Supple, thyroid normal.  Respiratory: Respiratory effort normal, BS equal bilaterally without rales, rhonchi, wheezing or stridor.  Cardio: RRR with no MRGs. Brisk peripheral pulses without edema.  Abdomen: Soft, + BS, + epigastric and LLQ tenderness, no guarding, rebound, hernias, masses. Lymphatics: Non tender without lymphadenopathy.  Musculoskeletal: Full ROM, 5/5 strength, Normal gait Skin: Warm, dry without rashes, lesions, ecchymosis.  Neuro: Cranial nerves intact. Normal muscle tone, no cerebellar symptoms. Psych: Awake and oriented X 3, normal affect, Insight and Judgment appropriate.    Vicie Mutters, PA-C 1:56 PM [facility]

## 2017-03-24 ENCOUNTER — Ambulatory Visit: Payer: Self-pay | Admitting: Physician Assistant

## 2017-03-27 DIAGNOSIS — H1045 Other chronic allergic conjunctivitis: Secondary | ICD-10-CM | POA: Diagnosis not present

## 2017-03-29 DIAGNOSIS — F411 Generalized anxiety disorder: Secondary | ICD-10-CM | POA: Diagnosis not present

## 2017-04-10 DIAGNOSIS — F411 Generalized anxiety disorder: Secondary | ICD-10-CM | POA: Diagnosis not present

## 2017-04-11 ENCOUNTER — Ambulatory Visit (INDEPENDENT_AMBULATORY_CARE_PROVIDER_SITE_OTHER): Payer: BLUE CROSS/BLUE SHIELD | Admitting: Physician Assistant

## 2017-04-11 ENCOUNTER — Encounter: Payer: Self-pay | Admitting: Physician Assistant

## 2017-04-11 VITALS — BP 122/80 | HR 81 | Temp 97.3°F | Resp 18 | Ht 63.0 in | Wt 208.0 lb

## 2017-04-11 DIAGNOSIS — J0100 Acute maxillary sinusitis, unspecified: Secondary | ICD-10-CM

## 2017-04-11 MED ORDER — PROMETHAZINE-DM 6.25-15 MG/5ML PO SYRP
5.0000 mL | ORAL_SOLUTION | Freq: Four times a day (QID) | ORAL | 1 refills | Status: DC | PRN
Start: 2017-04-11 — End: 2017-06-14

## 2017-04-11 MED ORDER — DEXAMETHASONE 0.5 MG PO TABS: ORAL_TABLET | ORAL | 0 refills | Status: DC

## 2017-04-11 MED ORDER — AZITHROMYCIN 250 MG PO TABS: ORAL_TABLET | ORAL | 1 refills | Status: AC

## 2017-04-11 NOTE — Patient Instructions (Addendum)
Do decadron 1-2 pills a day for 2-3 days Can continue sudafed Can do doxy  HOW TO TREAT VIRAL COUGH AND COLD SYMPTOMS:  -Symptoms usually last at least 1 week with the worst symptoms being around day 4.  - colds usually start with a sore throat and end with a cough, and the cough can take 2 weeks to get better.  -No antibiotics are needed for colds, flu, sore throats, cough, bronchitis UNLESS symptoms are longer than 7 days OR if you are getting better then get drastically worse.  -There are a lot of combination medications (Dayquil, Nyquil, Vicks 44, tyelnol cold and sinus, ETC). Please look at the ingredients on the back so that you are treating the correct symptoms and not doubling up on medications/ingredients.    Medicines you can use  Nasal congestion  - pseudoephedrine (Sudafed)- behind the counter, do not use if you have high blood pressure, medicine that have -D in them.  - phenylephrine (Sudafed PE) -Dextormethorphan + chlorpheniramine (Coridcidin HBP)- okay if you have high blood pressure -Oxymetazoline (Afrin) nasal spray- LIMIT to 3 days -Saline nasal spray -Neti pot (used distilled or bottled water)  Ear pain/congestion  -pseudoephedrine (sudafed) - Nasonex/flonase nasal spray  Fever  -Acetaminophen (Tyelnol) -Ibuprofen (Advil, motrin, aleve)  Sore Throat  -Acetaminophen (Tyelnol) -Ibuprofen (Advil, motrin, aleve) -Drink a lot of water -Gargle with salt water - Rest your voice (don't talk) -Throat sprays -Cough drops  Body Aches  -Acetaminophen (Tyelnol) -Ibuprofen (Advil, motrin, aleve)  Headache  -Acetaminophen (Tyelnol) -Ibuprofen (Advil, motrin, aleve) - Exedrin, Exedrin Migraine  Allergy symptoms (cough, sneeze, runny nose, itchy eyes) -Claritin or loratadine cheapest but likely the weakest  -Zyrtec or certizine at night because it can make you sleepy -The strongest is allegra or fexafinadine  Cheapest at [employer], [employer],  [employer]  Cough  -Dextromethorphan (Delsym)- medicine that has DM in it -Guafenesin (Mucinex/Robitussin) - cough drops - drink lots of water  Chest Congestion  -Guafenesin (Mucinex/Robitussin)  Red Itchy Eyes  - Naphcon-A  Upset Stomach  - Bland diet (nothing spicy, greasy, fried, and high acid foods like tomatoes, oranges, berries) -OKAY- cereal, bread, soup, crackers, rice -Eat smaller more frequent meals -reduce caffeine, no alcohol -Loperamide (Imodium-AD) if diarrhea -Prevacid for heart burn  General health when sick  -Hydration -wash your hands frequently -keep surfaces clean -change pillow cases and sheets often -Get fresh air but do not exercise strenuously -Vitamin D, double up on it - Vitamin C -Zinc

## 2017-04-11 NOTE — Progress Notes (Signed)
   Subjective:    Patient ID: Lori West, female    DOB: 01/29/1996, 21 y.o.   MRN: [id number]  HPI 21 y.o. WF presents cold symptoms, current symptoms x 5 days. She has had cold 6 x in the last 3 months, she nannies, kids had croup recently, sister has laryngitis. Having nasal and chest congestion with green/yellow mucus, stuffy ears, chest pain with cough. On sudafed, netipot, and guaff. She has not been taking allertec for last few weeks.   Blood pressure 122/80, pulse 81, temperature (!) 97.3 F (36.3 C), resp. rate 18, height 5\' 3"  (1.6 m), weight 208 lb (94.3 kg), SpO2 97 %.  Medications No current outpatient prescriptions on file prior to visit.   No current facility-administered medications on file prior to visit.     Problem list She has Morbid obesity (BMI 34+); Migraine with aura and without status migrainosus, not intractable; Mild persistent asthma; Chronic rhinitis; Cyst of skin; and History of keloid of skin on her problem list.  Review of Systems  Constitutional: Negative for chills and diaphoresis.  HENT: Positive for congestion, postnasal drip, sinus pressure and sneezing. Negative for ear pain and sore throat.   Respiratory: Positive for cough. Negative for chest tightness, shortness of breath and wheezing.   Cardiovascular: Negative.   Gastrointestinal: Negative.   Genitourinary: Negative.   Musculoskeletal: Negative for neck pain.  Neurological: Positive for headaches.       Objective:   Physical Exam  Constitutional: She is oriented to person, place, and time. She appears well-developed and well-nourished.  HENT:  Right Ear: Hearing and external ear normal. No mastoid tenderness. Tympanic membrane is injected. Tympanic membrane is not perforated, not erythematous, not retracted and not bulging. A middle ear effusion is present.  Left Ear: Hearing and external ear normal. No mastoid tenderness. Tympanic membrane is injected. Tympanic membrane is not  perforated, not erythematous, not retracted and not bulging. A middle ear effusion is present.  Nose: Right sinus exhibits maxillary sinus tenderness. Left sinus exhibits maxillary sinus tenderness.  Mouth/Throat: Uvula is midline, oropharynx is clear and moist and mucous membranes are normal.  Eyes: Pupils are equal, round, and reactive to light. Conjunctivae and EOM are normal.  Neck: Neck supple.  Cardiovascular: Normal rate and regular rhythm.   Pulmonary/Chest: Effort normal and breath sounds normal. No respiratory distress. She has no wheezes.  Abdominal: Soft. Bowel sounds are normal.  Musculoskeletal: Normal range of motion.  Lymphadenopathy:    She has no cervical adenopathy.  Neurological: She is alert and oriented to person, place, and time.  Skin: Skin is warm and dry.      Assessment & Plan:   Acute non-recurrent maxillary sinusitis -     azithromycin (ZITHROMAX) 250 MG tablet; Take 2 tablets (500 mg) on  Day 1,  followed by 1 tablet (250 mg) once daily on Days 2 through 5. -     dexamethasone (DECADRON) 0.5 MG tablet; take 1 tablet PO BID for 3 days, then take 1 tablet PO for 4 days OR AS DIRECTED -     promethazine-dextromethorphan (PROMETHAZINE-DM) 6.25-15 MG/5ML syrup; Take 5 mLs by mouth 4 (four) times daily as needed for cough. Will hold the zpak and take if she is not getting better, increase fluids, rest, cont allergy pill

## 2017-04-17 DIAGNOSIS — F411 Generalized anxiety disorder: Secondary | ICD-10-CM | POA: Diagnosis not present

## 2017-04-18 ENCOUNTER — Other Ambulatory Visit: Payer: Self-pay | Admitting: Physician Assistant

## 2017-04-18 MED ORDER — AZITHROMYCIN 250 MG PO TABS: ORAL_TABLET | ORAL | 1 refills | Status: AC

## 2017-05-02 DIAGNOSIS — F411 Generalized anxiety disorder: Secondary | ICD-10-CM | POA: Diagnosis not present

## 2017-05-09 DIAGNOSIS — F411 Generalized anxiety disorder: Secondary | ICD-10-CM | POA: Diagnosis not present

## 2017-05-17 DIAGNOSIS — F411 Generalized anxiety disorder: Secondary | ICD-10-CM | POA: Diagnosis not present

## 2017-05-22 DIAGNOSIS — F411 Generalized anxiety disorder: Secondary | ICD-10-CM | POA: Diagnosis not present

## 2017-05-24 DIAGNOSIS — N390 Urinary tract infection, site not specified: Secondary | ICD-10-CM | POA: Diagnosis not present

## 2017-05-24 DIAGNOSIS — N760 Acute vaginitis: Secondary | ICD-10-CM | POA: Diagnosis not present

## 2017-06-05 DIAGNOSIS — F411 Generalized anxiety disorder: Secondary | ICD-10-CM | POA: Diagnosis not present

## 2017-06-14 ENCOUNTER — Ambulatory Visit (INDEPENDENT_AMBULATORY_CARE_PROVIDER_SITE_OTHER): Payer: BLUE CROSS/BLUE SHIELD | Admitting: Physician Assistant

## 2017-06-14 ENCOUNTER — Encounter: Payer: Self-pay | Admitting: Physician Assistant

## 2017-06-14 VITALS — BP 120/70 | HR 86 | Temp 98.6°F | Resp 16 | Ht 63.0 in | Wt 219.6 lb

## 2017-06-14 DIAGNOSIS — R0602 Shortness of breath: Secondary | ICD-10-CM | POA: Diagnosis not present

## 2017-06-14 DIAGNOSIS — Z8349 Family history of other endocrine, nutritional and metabolic diseases: Secondary | ICD-10-CM

## 2017-06-14 DIAGNOSIS — R9431 Abnormal electrocardiogram [ECG] [EKG]: Secondary | ICD-10-CM

## 2017-06-14 DIAGNOSIS — R5383 Other fatigue: Secondary | ICD-10-CM | POA: Diagnosis not present

## 2017-06-14 DIAGNOSIS — J45909 Unspecified asthma, uncomplicated: Secondary | ICD-10-CM

## 2017-06-14 DIAGNOSIS — R079 Chest pain, unspecified: Secondary | ICD-10-CM | POA: Diagnosis not present

## 2017-06-14 MED ORDER — MONTELUKAST SODIUM 10 MG PO TABS: 10.0000 mg | ORAL_TABLET | Freq: Every day | ORAL | 2 refills | Status: DC

## 2017-06-14 MED ORDER — LEVALBUTEROL TARTRATE 45 MCG/ACT IN AERO: 2.0000 | INHALATION_SPRAY | RESPIRATORY_TRACT | 4 refills | Status: DC | PRN

## 2017-06-14 NOTE — Patient Instructions (Addendum)
Add on zantac 150mg  2 x a day for 10 days, then once at night for 10 days, then as needed Can do xopenex twice a day for 3-5 days and then AS needed for shortness of breath/wheezing or before working out Add on singulair 10mg  daily   Go to the ER if any chest pain, shortness of breath, nausea, dizziness, severe HA, changes vision/speech    Food Choices for Gastroesophageal Reflux Disease When you have gastroesophageal reflux disease (GERD), the foods you eat and your eating habits are very important. Choosing the right foods can help ease the discomfort of GERD. WHAT GENERAL GUIDELINES DO I NEED TO FOLLOW?  Choose fruits, vegetables, whole grains, low-fat dairy products, and low-fat meat, fish, and poultry.  Limit fats such as oils, salad dressings, butter, nuts, and avocado.  Keep a food diary to identify foods that cause symptoms.  Avoid foods that cause reflux. These may be different for different people.  Eat frequent small meals instead of three large meals each day.  Eat your meals slowly, in a relaxed setting.  Limit fried foods.  Cook foods using methods other than frying.  Avoid drinking alcohol.  Avoid drinking large amounts of liquids with your meals.  Avoid bending over or lying down until 2-3 hours after eating. WHAT FOODS ARE NOT RECOMMENDED? The following are some foods and drinks that may worsen your symptoms: Vegetables Tomatoes. Tomato juice. Tomato and spaghetti sauce. Chili peppers. Onion and garlic. Horseradish. Fruits Oranges, grapefruit, and lemon (fruit and juice). Meats High-fat meats, fish, and poultry. This includes hot dogs, ribs, ham, sausage, salami, and bacon. Dairy Whole milk and chocolate milk. Sour cream. Cream. Butter. Ice cream. Cream cheese.  Beverages Coffee and tea, with or without caffeine. Carbonated beverages or energy drinks. Condiments Hot sauce. Barbecue sauce.  Sweets/Desserts Chocolate and cocoa. Donuts. Peppermint and  spearmint. Fats and Oils High-fat foods, including [place] fries and potato chips. Other Vinegar. Strong spices, such as black pepper, white pepper, red pepper, cayenne, curry powder, cloves, ginger, and chili powder.    Costochondritis Costochondritis is swelling and irritation (inflammation) of the tissue (cartilage) that connects your ribs to your breastbone (sternum). This causes pain in the front of your chest. The pain usually starts gradually and involves more than one rib. What are the causes? The exact cause of this condition is not always known. It results from stress on the cartilage where your ribs attach to your sternum. The cause of this stress could be:  Chest injury (trauma).  Exercise or activity, such as lifting.  Severe coughing.  What increases the risk? You may be at higher risk for this condition if you:  Are female.  Are 8530?23 years old.  Recently started a new exercise or work activity.  Have low levels of vitamin D.  Have a condition that makes you cough frequently.  What are the signs or symptoms? The main symptom of this condition is chest pain. The pain:  Usually starts gradually and can be sharp or dull.  Gets worse with deep breathing, coughing, or exercise.  Gets better with rest.  May be worse when you press on the sternum-rib connection (tenderness).  How is this diagnosed? This condition is diagnosed based on your symptoms, medical history, and a physical exam. Your health care provider will check for tenderness when pressing on your sternum. This is the most important finding. You may also have tests to rule out other causes of chest pain. These may include:  A chest X-ray to check for lung problems.  An electrocardiogram (ECG) to see if you have a heart problem that could be causing the pain.  An imaging scan to rule out a chest or rib fracture.  How is this treated? This condition usually goes away on its own over time. Your  health care provider may prescribe an NSAID to reduce pain and inflammation. Your health care provider may also suggest that you:  Rest and avoid activities that make pain worse.  Apply heat or cold to the area to reduce pain and inflammation.  Do exercises to stretch your chest muscles.  If these treatments do not help, your health care provider may inject a numbing medicine at the sternum-rib connection to help relieve the pain. Follow these instructions at home:  Avoid activities that make pain worse. This includes any activities that use chest, abdominal, and side muscles.  If directed, put ice on the painful area: ? Put ice in a plastic bag. ? Place a towel between your skin and the bag. ? Leave the ice on for 20 minutes, 2-3 times a day.  If directed, apply heat to the affected area as often as told by your health care provider. Use the heat source that your health care provider recommends, such as a moist heat pack or a heating pad. ? Place a towel between your skin and the heat source. ? Leave the heat on for 20-30 minutes. ? Remove the heat if your skin turns bright red. This is especially important if you are unable to feel pain, heat, or cold. You may have a greater risk of getting burned.  Take over-the-counter and prescription medicines only as told by your health care provider.  Return to your normal activities as told by your health care provider. Ask your health care provider what activities are safe for you.  Keep all follow-up visits as told by your health care provider. This is important. Contact a health care provider if:  You have chills or a fever.  Your pain does not go away or it gets worse.  You have a cough that does not go away (is persistent). Get help right away if:  You have shortness of breath. This information is not intended to replace advice given to you by your health care provider. Make sure you discuss any questions you have with your health  care provider. Document Released: 03/16/2005 Document Revised: 12/25/2015 Document Reviewed: 09/30/2015 Elsevier Interactive Patient Education  [employer]2018 [employer]

## 2017-06-14 NOTE — Progress Notes (Signed)
Subjective:    Patient ID: Lori LeekShelby C West, female    DOB: 10/12/1995, 21 y.o.   MRN: [id number]  HPI 21 y.o. WF presents with abnormal EKG.  Went to UC for increasing SOB x 2 weeks, then yesterday had pressure of her chest yesterday.  She has had some cough and sinus congestion, on allergy pill. She has history of asthma and has had some wheezing. She has also has some thyroid tenderness, and hard for her to lose weight, she states she has a family history of hypothyroidism.   Blood pressure 120/70, pulse 86, temperature 98.6 F (37 C), resp. rate 16, height 5\' 3"  (1.6 m), weight 219 lb 9.6 oz (99.6 kg), SpO2 99 %.  Medications No current outpatient medications on file prior to visit.   No current facility-administered medications on file prior to visit.     Problem list She has Morbid obesity (BMI 34+); Migraine with aura and without status migrainosus, not intractable; Mild persistent asthma; Chronic rhinitis; Cyst of skin; and History of keloid of skin on their problem list.   Review of Systems  Constitutional: Positive for fatigue. Negative for chills and fever.  HENT: Positive for congestion and rhinorrhea.   Respiratory: Positive for cough, chest tightness, shortness of breath and wheezing. Negative for apnea, choking and stridor.   Cardiovascular: Negative.  Negative for chest pain, palpitations and leg swelling.  Gastrointestinal: Negative.   Genitourinary: Negative.   Musculoskeletal: Negative.   Neurological: Negative.   Hematological: Negative.   Psychiatric/Behavioral: Negative.        Objective:   Physical Exam  Constitutional: She is oriented to person, place, and time. She appears well-developed and well-nourished.  HENT:  Right Ear: Hearing and external ear normal. No mastoid tenderness. Tympanic membrane is injected. Tympanic membrane is not perforated, not erythematous, not retracted and not bulging. A middle ear effusion is present.  Left Ear: Hearing  and external ear normal. No mastoid tenderness. Tympanic membrane is injected. Tympanic membrane is not perforated, not erythematous, not retracted and not bulging. A middle ear effusion is present.  Nose: Right sinus exhibits maxillary sinus tenderness. Left sinus exhibits maxillary sinus tenderness.  Mouth/Throat: Uvula is midline, oropharynx is clear and moist and mucous membranes are normal. No oropharyngeal exudate.  Eyes: Conjunctivae and EOM are normal. Pupils are equal, round, and reactive to light.  Neck: Neck supple.  Cardiovascular: Normal rate and regular rhythm.  Pulmonary/Chest: Effort normal. No respiratory distress. She has wheezes. She has no rales. She exhibits tenderness (chest wall tenderness to palpation).  Abdominal: Soft. Bowel sounds are normal. There is tenderness (epigastric).  Musculoskeletal: Normal range of motion.  Lymphadenopathy:    She has no cervical adenopathy.  Neurological: She is alert and oriented to person, place, and time.  Skin: Skin is warm and dry.       Assessment & Plan:    Abnormal EKG -     EKG 12-Lead - normal when compared to last EKG and normal EKG, no ST changes  Family history of hypothyroidism -     Thyroglobulin antibody; Future -     Thyroid peroxidase antibody; Future -     T4, free; Future -     TSH; Future  Mild asthma without complication, unspecified whether persistent -     levalbuterol (XOPENEX HFA) 45 MCG/ACT inhaler; Inhale 2 puffs into the lungs every 4 (four) hours as needed for wheezing. -     montelukast (SINGULAIR) 10 MG tablet; Take 1  tablet (10 mg total) by mouth daily.  The patient was advised to call immediately if she has any concerning symptoms in the interval. The patient voices understanding of current treatment options and is in agreement with the current care plan.The patient knows to call the clinic with any problems, questions or concerns or go to the ER if any further progression of symptoms.

## 2017-06-19 ENCOUNTER — Other Ambulatory Visit: Payer: Self-pay

## 2017-06-21 ENCOUNTER — Ambulatory Visit (INDEPENDENT_AMBULATORY_CARE_PROVIDER_SITE_OTHER): Payer: BLUE CROSS/BLUE SHIELD | Admitting: Family Medicine

## 2017-06-21 ENCOUNTER — Encounter: Payer: Self-pay | Admitting: Family Medicine

## 2017-06-21 VITALS — BP 110/74 | HR 79 | Ht 63.0 in | Wt 214.8 lb

## 2017-06-21 DIAGNOSIS — J302 Other seasonal allergic rhinitis: Secondary | ICD-10-CM

## 2017-06-21 DIAGNOSIS — F411 Generalized anxiety disorder: Secondary | ICD-10-CM | POA: Diagnosis not present

## 2017-06-21 DIAGNOSIS — J3089 Other allergic rhinitis: Secondary | ICD-10-CM

## 2017-06-21 DIAGNOSIS — J4540 Moderate persistent asthma, uncomplicated: Secondary | ICD-10-CM | POA: Insufficient documentation

## 2017-06-21 DIAGNOSIS — K219 Gastro-esophageal reflux disease without esophagitis: Secondary | ICD-10-CM | POA: Diagnosis not present

## 2017-06-21 MED ORDER — BUDESONIDE-FORMOTEROL FUMARATE 160-4.5 MCG/ACT IN AERO: 2.0000 | INHALATION_SPRAY | Freq: Two times a day (BID) | RESPIRATORY_TRACT | 5 refills | Status: DC

## 2017-06-21 NOTE — Patient Instructions (Addendum)
Moderate persistent asthma without complication - Begin Symbicort 160 two puffs twice a day with a spacer. Rinse your mouth after each use. - Continue Singulair 10 mg once a day - Continue Levalbuterol 2 puffs every 4 hours as needed for shortness of breath or chest tightness    Seasonal and perennial allergic rhinitis - Continue allergen avoidance measures - Continue Claritin 10 mg once a day as needed for runny nose - Begin fluticasone nasal spray as needed for stuffy nose  Gastroesophageal reflux disease, esophagitis presence not specified - Continue Zantac 150 mg twice a day - Continue to decrease caffeine and chocolate intake  Follow up in 2 weeks

## 2017-06-21 NOTE — Progress Notes (Signed)
[address] [place] [address] Dept: [phone]  FAMILY NURSE PRACTITIONER FOLLOW UP NOTE  Patient ID: Lori West, female    DOB: Dec 31, 1995  Age: 22 y.o. MRN: [id number] Date of Office Visit: 06/21/2017  Assessment  Chief Complaint: Asthma (Ed visit after Christmas due to asthma attack. Has had two in the past month. Taking levoalbuterol as needed. Also taking singulair and a daily antihistmine. )  HPI Lori West is a 22 year old female patient who presents to the clinic for a sick visit today. She was last seen in this clinic on 04/05/2016 by Dr. Nunzio Cobbs for evaluation of persistent asthma and allergic rhinitis. At that point, she had tried Brio Ellipta which caused thrush after 5 days. She was then prescribed Qvar 40 which she reports she used less than 1 month because she felt it was not effective and she was doing fine without it. She reports beginning to feel shortness of breath and chest tightness, especially with activity, around the first week in December 2018. She went to an Urgent Care on June 14, 2017 with chest tightness and shortness of breath where she had an abnormal ECG. She then went to her primary care provider and had a repeat ECG that was normal. She was also started on montelukast 10 mg once a day, levalbuterol twice a day for 3-5 days then every 4 hours as needed, and ranitidine 150 mg twice a day for 10 days.   At today's visit, Lori West reports she continues to feel shortness of breath and chest tightness, especially with exercise. She denies cough and fever. She reports moderate relief from using levalbuterol twice a day and montelukast once a day. Prior to December 26, she had not used any inhalers or other asthma controller medication for approximately 1 year. She has acquired a new rabbit in November of this year. She has no nasal symptom complaints today.  Drug Allergies:  Allergies  Allergen Reactions  . Breo Ellipta [Fluticasone  Furoate-Vilanterol] Other (See Comments)    Thrush  . Peanut-Containing Drug Products Other (See Comments)    Tingling and numbness in throat from tree nuts  . Prednisone Other (See Comments)    Elevated temperature  . Penicillins Rash    Augmentin  . Alcohol-Sulfur [Sulfur]   . Lactose Intolerance (Gi)     Not a true allergy- causes acne breakouts  . Metronidazole     Other reaction(s): Other (See Comments) Seizures and high temp.  . Toradol [Ketorolac Tromethamine]     Syncope, nausea    Physical Exam: BP 110/74 (BP Location: Left Arm, Patient Position: Sitting, Cuff Size: Normal)   Pulse 79   Ht 5\' 3"  (1.6 m)   Wt 214 lb 12.8 oz (97.4 kg)   SpO2 98%   BMI 38.05 kg/m    Physical Exam  Constitutional: She is oriented to person, place, and time. She appears well-developed and well-nourished.  HENT:  Right Ear: External ear normal.  Left Ear: External ear normal.  Nose: Nose normal.  Mouth/Throat: Oropharynx is clear and moist.  Eyes: Conjunctivae are normal.  Neck: Normal range of motion. Neck supple.  Cardiovascular: Normal rate, regular rhythm and normal heart sounds.  S1-S2 normal.  Regular heart rate and rhythm.  No murmur noted.  Pulmonary/Chest: Effort normal and breath sounds normal.  Lungs clear to auscultation  Musculoskeletal: Normal range of motion.  Neurological: She is alert and oriented to person, place, and time.  Skin: Skin is warm  and dry.  Psychiatric: She has a normal mood and affect. Her behavior is normal.    Diagnostics: FVC 2.93, FEV1 2.47.  Predicted FVC 3.66, predicted FEV1 3.19.  Spirometry is within the normal range.    Assessment and Plan: 1. Moderate persistent asthma without complication   2. Seasonal and perennial allergic rhinitis   3. Gastroesophageal reflux disease, esophagitis presence not specified     Meds ordered this encounter  Medications  . budesonide-formoterol (SYMBICORT) 160-4.5 MCG/ACT inhaler    Sig: Inhale 2  puffs into the lungs 2 (two) times daily.    Dispense:  1 Inhaler    Refill:  5    Patient Instructions  Moderate persistent asthma without complication - Begin Symbicort 160 two puffs twice a day with a spacer. Rinse your mouth after each use. Symbicort 160 sample and spacer provided in the office today. - Continue Singulair 10 mg once a day - Continue levalbuterol 2 puffs every 4 hours as needed for shortness of breath or chest tightness    Seasonal and perennial allergic rhinitis - Continue allergen avoidance measures - Continue Claritin 10 mg once a day as needed for runny nose - Begin fluticasone nasal spray as needed for stuffy nose  Gastroesophageal reflux disease, esophagitis presence not specified - Continue Zantac 150 mg twice a day - Continue to decrease caffeine and chocolate intake  Follow up in 2 weeks  Return in about 2 weeks (around 07/05/2017), or if symptoms worsen or fail to improve.    Thank you for the opportunity to care for this patient.  Please do not hesitate to contact me with questions.  Thermon LeylandAnne Ambs, FNP [facility]

## 2017-07-03 ENCOUNTER — Ambulatory Visit (INDEPENDENT_AMBULATORY_CARE_PROVIDER_SITE_OTHER): Payer: BLUE CROSS/BLUE SHIELD | Admitting: Allergy and Immunology

## 2017-07-03 ENCOUNTER — Encounter: Payer: Self-pay | Admitting: Allergy and Immunology

## 2017-07-03 VITALS — BP 116/66 | HR 70 | Ht 63.0 in | Wt 214.0 lb

## 2017-07-03 DIAGNOSIS — J45909 Unspecified asthma, uncomplicated: Secondary | ICD-10-CM

## 2017-07-03 DIAGNOSIS — J302 Other seasonal allergic rhinitis: Secondary | ICD-10-CM

## 2017-07-03 DIAGNOSIS — J3089 Other allergic rhinitis: Secondary | ICD-10-CM

## 2017-07-03 DIAGNOSIS — J310 Chronic rhinitis: Secondary | ICD-10-CM

## 2017-07-03 DIAGNOSIS — J4540 Moderate persistent asthma, uncomplicated: Secondary | ICD-10-CM

## 2017-07-03 MED ORDER — MONTELUKAST SODIUM 10 MG PO TABS: 10.0000 mg | ORAL_TABLET | Freq: Every day | ORAL | 5 refills | Status: DC

## 2017-07-03 MED ORDER — LEVALBUTEROL TARTRATE 45 MCG/ACT IN AERO
2.0000 | INHALATION_SPRAY | RESPIRATORY_TRACT | 3 refills | Status: DC | PRN
Start: 2017-07-03 — End: 2018-07-31

## 2017-07-03 MED ORDER — FEXOFENADINE HCL 180 MG PO TABS: 180.0000 mg | ORAL_TABLET | Freq: Every day | ORAL | 3 refills | Status: DC

## 2017-07-03 NOTE — Assessment & Plan Note (Signed)
   Continue appropriate allergen avoidance measures and montelukast 10 mg daily bedtime.  A prescription has been provided for fexofenadine (Allegra) 180 mg daily as needed.  Nasal saline spray (i.e., Simply Saline) or nasal saline lavage (i.e., NeilMed) is recommended as needed.

## 2017-07-03 NOTE — Progress Notes (Signed)
Follow-up Note  RE: Lori West MRN: [id number] DOB: 13-Dec-1995 Date of Office Visit: 07/03/2017  Primary care provider: Lucky Cowboy, MD Referring provider: Lucky Cowboy, MD  History of present illness: Lori West is a 22 y.o. female with persistent asthma, allergic rhinitis, and gastroesophageal reflux presenting today for follow-up.  She was previously seen in this clinic on June 21, 2017 for an asthma exacerbation.  She reports that her asthma symptoms have improved over the past 2 weeks.  She is currently taking Symbicort 160-4.5 g, 2 inhalations via spacer device twice daily, and montelukast 10 mg daily at bedtime.  While her symptoms have improved, she still experiences occasional wheezing and mild chest tightness, particularly at nighttime, but she believes that this may be due to residual effects of her upper respiratory tract infection.  She is currently taking loratadine daily in an attempt to roll her nasal allergy symptoms.  States that she has tried levocetirizine in the past, however had to discontinue due to her marked somnolence.   Assessment and plan: Moderate persistent asthma  For now, continue Symbicort 160-4.5 g, 2 inhalations via spacer device twice daily, montelukast 10 mg daily at bedtime, and Xopenex HFA, 1-2 inhalations every 6 hours as needed and 15 minutes prior to exercise.  If her asthma persists or progresses, we will add Spiriva, or consider a biologic agent.  Subjective and objective measures of pulmonary function will be followed and the treatment plan will be adjusted accordingly.  Chronic rhinitis  Continue appropriate allergen avoidance measures and montelukast 10 mg daily bedtime.  A prescription has been provided for fexofenadine (Allegra) 180 mg daily as needed.  Nasal saline spray (i.e., Simply Saline) or nasal saline lavage (i.e., NeilMed) is recommended as needed.   Meds ordered this encounter  Medications  .  montelukast (SINGULAIR) 10 MG tablet    Sig: Take 1 tablet (10 mg total) by mouth daily.    Dispense:  30 tablet    Refill:  5  . levalbuterol (XOPENEX HFA) 45 MCG/ACT inhaler    Sig: Inhale 2 puffs into the lungs every 4 (four) hours as needed for wheezing.    Dispense:  1 Inhaler    Refill:  3  . fexofenadine (ALLEGRA) 180 MG tablet    Sig: Take 1 tablet (180 mg total) by mouth daily.    Dispense:  30 tablet    Refill:  3    Diagnostics: Spirometry:  Normal with an FEV1 of 2.86 L and an FEV1 ratio of 101%.  Please see scanned spirometry results for details.    Physical examination: Blood pressure 116/66, pulse 70, height 5\' 3"  (1.6 m), weight 214 lb (97.1 kg), SpO2 98 %.  General: Alert, interactive, in no acute distress. HEENT: TMs pearly gray, turbinates mildly edematous without discharge, post-pharynx unremarkable. Neck: Supple without lymphadenopathy. Lungs: Clear to auscultation without wheezing, rhonchi or rales. CV: Normal S1, S2 without murmurs. Skin: Warm and dry, without lesions or rashes.  The following portions of the patient's history were reviewed and updated as appropriate: allergies, current medications, past family history, past medical history, past social history, past surgical history and problem list.  Allergies as of 07/03/2017      Reactions   Breo Ellipta [fluticasone Furoate-vilanterol] Other (See Comments)   Thrush   Peanut-containing Drug Products Other (See Comments)   Tingling and numbness in throat from tree nuts   Prednisone Other (See Comments)   Elevated temperature   Penicillins Rash  Augmentin   Alcohol-sulfur [sulfur]    Lactose Intolerance (gi)    Not a true allergy- causes acne breakouts   Metronidazole    Other reaction(s): Other (See Comments) Seizures and high temp.   Toradol [ketorolac Tromethamine]    Syncope, nausea      Medication List        Accurate as of 07/03/17  8:00 PM. Always use your most recent med list.           budesonide-formoterol 160-4.5 MCG/ACT inhaler Commonly known as:  SYMBICORT Inhale 2 puffs into the lungs 2 (two) times daily.   fexofenadine 180 MG tablet Commonly known as:  ALLEGRA Take 1 tablet (180 mg total) by mouth daily.   levalbuterol 45 MCG/ACT inhaler Commonly known as:  XOPENEX HFA Inhale 2 puffs into the lungs every 4 (four) hours as needed for wheezing.   loratadine 10 MG tablet Commonly known as:  CLARITIN Take 10 mg by mouth daily.   montelukast 10 MG tablet Commonly known as:  SINGULAIR Take 1 tablet (10 mg total) by mouth daily.       Allergies  Allergen Reactions  . Breo Ellipta [Fluticasone Furoate-Vilanterol] Other (See Comments)    Thrush  . Peanut-Containing Drug Products Other (See Comments)    Tingling and numbness in throat from tree nuts  . Prednisone Other (See Comments)    Elevated temperature  . Penicillins Rash    Augmentin  . Alcohol-Sulfur [Sulfur]   . Lactose Intolerance (Gi)     Not a true allergy- causes acne breakouts  . Metronidazole     Other reaction(s): Other (See Comments) Seizures and high temp.  . Toradol [Ketorolac Tromethamine]     Syncope, nausea   Review of systems: Review of systems negative except as noted in HPI / PMHx or noted below: Constitutional: Negative.  HENT: Negative.   Eyes: Negative.  Respiratory: Negative.   Cardiovascular: Negative.  Gastrointestinal: Negative.  Genitourinary: Negative.  Musculoskeletal: Negative.  Neurological: Negative.  Endo/Heme/Allergies: Negative.  Cutaneous: Negative.  Past Medical History:  Diagnosis Date  . Anxiety   . Asthma   . C. difficile diarrhea   . Depression   . Gastritis   . Gastropathy    mild reactive  . Headache   . IBS (irritable bowel syndrome)   . Obesity   . Vitamin D deficiency     Family History  Problem Relation Age of Onset  . Heart disease Maternal Aunt   . Parkinsonism Maternal Grandfather   . Migraines Mother   . Allergic  rhinitis Mother   . Allergic rhinitis Father   . Skin cancer Maternal Aunt   . Diabetes Maternal Grandmother   . Angioedema Neg Hx   . Asthma Neg Hx   . Immunodeficiency Neg Hx   . Eczema Neg Hx   . Urticaria Neg Hx     Social History   Socioeconomic History  . Marital status: Single    Spouse name: Not on file  . Number of children: 0  . Years of education: Not on file  . Highest education level: Not on file  Social Needs  . Financial resource strain: Not on file  . Food insecurity - worry: Not on file  . Food insecurity - inability: Not on file  . Transportation needs - medical: Not on file  . Transportation needs - non-medical: Not on file  Occupational History  . Occupation: Consulting civil engineertudent   Tobacco Use  . Smoking status: Never Smoker  .  Smokeless tobacco: Never Used  Substance and Sexual Activity  . Alcohol use: No  . Drug use: No  . Sexual activity: Yes    Birth control/protection: IUD  Other Topics Concern  . Not on file  Social History Narrative   3 cups of tea a day    Review of systems: Review of systems negative except as noted in HPI / PMHx or noted below: Constitutional: Negative.  HENT: Negative.   Eyes: Negative.  Respiratory: Negative.   Cardiovascular: Negative.  Gastrointestinal: Negative.  Genitourinary: Negative.  Musculoskeletal: Negative.  Neurological: Negative.  Endo/Heme/Allergies: Negative.  Cutaneous: Negative.  Past Medical History:  Diagnosis Date  . Anxiety   . Asthma   . C. difficile diarrhea   . Depression   . Gastritis   . Gastropathy    mild reactive  . Headache   . IBS (irritable bowel syndrome)   . Obesity   . Vitamin D deficiency     Family History  Problem Relation Age of Onset  . Heart disease Maternal Aunt   . Parkinsonism Maternal Grandfather   . Migraines Mother   . Allergic rhinitis Mother   . Allergic rhinitis Father   . Skin cancer Maternal Aunt   . Diabetes Maternal Grandmother   . Angioedema Neg Hx     . Asthma Neg Hx   . Immunodeficiency Neg Hx   . Eczema Neg Hx   . Urticaria Neg Hx     Social History   Socioeconomic History  . Marital status: Single    Spouse name: Not on file  . Number of children: 0  . Years of education: Not on file  . Highest education level: Not on file  Social Needs  . Financial resource strain: Not on file  . Food insecurity - worry: Not on file  . Food insecurity - inability: Not on file  . Transportation needs - medical: Not on file  . Transportation needs - non-medical: Not on file  Occupational History  . Occupation: Consulting civil engineer   Tobacco Use  . Smoking status: Never Smoker  . Smokeless tobacco: Never Used  Substance and Sexual Activity  . Alcohol use: No  . Drug use: No  . Sexual activity: Yes    Birth control/protection: IUD  Other Topics Concern  . Not on file  Social History Narrative   3 cups of tea a day     I appreciate the opportunity to take part in [facility] care. Please do not hesitate to contact me with questions.  Sincerely,   R. Jorene Guest, MD

## 2017-07-03 NOTE — Assessment & Plan Note (Addendum)
   For now, continue Symbicort 160-4.5 g, 2 inhalations via spacer device twice daily, montelukast 10 mg daily at bedtime, and Xopenex HFA, 1-2 inhalations every 6 hours as needed and 15 minutes prior to exercise.  If her asthma persists or progresses, we will add Spiriva, or consider a biologic agent.  Subjective and objective measures of pulmonary function will be followed and the treatment plan will be adjusted accordingly.

## 2017-07-03 NOTE — Patient Instructions (Signed)
Moderate persistent asthma  For now, continue Symbicort 160-4.5 g, 2 inhalations via spacer device twice daily, montelukast 10 mg daily at bedtime, and Xopenex HFA, 1-2 inhalations every 6 hours as needed and 15 minutes prior to exercise.  If her asthma persists or progresses, we will add Spiriva, or consider a biologic agent.  Subjective and objective measures of pulmonary function will be followed and the treatment plan will be adjusted accordingly.  Chronic rhinitis  Continue appropriate allergen avoidance measures and montelukast 10 mg daily bedtime.  A prescription has been provided for fexofenadine (Allegra) 180 mg daily as needed.  Nasal saline spray (i.e., Simply Saline) or nasal saline lavage (i.e., NeilMed) is recommended as needed.   Return in about 4 months (around 10/31/2017), or if symptoms worsen or fail to improve.

## 2017-07-05 DIAGNOSIS — Z713 Dietary counseling and surveillance: Secondary | ICD-10-CM | POA: Diagnosis not present

## 2017-08-21 DIAGNOSIS — F411 Generalized anxiety disorder: Secondary | ICD-10-CM | POA: Diagnosis not present

## 2017-08-22 DIAGNOSIS — F411 Generalized anxiety disorder: Secondary | ICD-10-CM | POA: Diagnosis not present

## 2017-09-06 ENCOUNTER — Encounter: Payer: Self-pay | Admitting: Family Medicine

## 2017-09-06 ENCOUNTER — Ambulatory Visit (INDEPENDENT_AMBULATORY_CARE_PROVIDER_SITE_OTHER): Payer: BLUE CROSS/BLUE SHIELD | Admitting: Family Medicine

## 2017-09-06 VITALS — BP 116/76 | HR 80 | Resp 20 | Wt 198.0 lb

## 2017-09-06 DIAGNOSIS — F411 Generalized anxiety disorder: Secondary | ICD-10-CM | POA: Diagnosis not present

## 2017-09-06 DIAGNOSIS — J3089 Other allergic rhinitis: Secondary | ICD-10-CM | POA: Diagnosis not present

## 2017-09-06 DIAGNOSIS — J302 Other seasonal allergic rhinitis: Secondary | ICD-10-CM

## 2017-09-06 DIAGNOSIS — H1010 Acute atopic conjunctivitis, unspecified eye: Secondary | ICD-10-CM | POA: Diagnosis not present

## 2017-09-06 DIAGNOSIS — J4540 Moderate persistent asthma, uncomplicated: Secondary | ICD-10-CM | POA: Diagnosis not present

## 2017-09-06 MED ORDER — AZELASTINE HCL 0.15 % NA SOLN: 2.0000 | Freq: Two times a day (BID) | NASAL | 5 refills | Status: DC

## 2017-09-06 MED ORDER — DEXAMETHASONE 0.5 MG PO TABS: 0.5000 mg | ORAL_TABLET | ORAL | 0 refills | Status: DC

## 2017-09-06 MED ORDER — FLUTICASONE PROPIONATE 93 MCG/ACT NA EXHU: 2.0000 | INHALANT_SUSPENSION | Freq: Two times a day (BID) | NASAL | 11 refills | Status: DC

## 2017-09-06 NOTE — Progress Notes (Signed)
[address] [place] [address] Dept: [phone]  FOLLOW UP NOTE  Patient ID: Lori LeekShelby C Royster, female    DOB: 10/02/1995  Age: 22 y.o. MRN: [id number] Date of Office Visit: 09/06/2017  Assessment  Chief Complaint: Asthma (cough, sob); Allergies (PND, ears full, sinus headache and pressure); and Sore Throat  HPI Lori West is a 22 year old female who presents to the clinic for a sick visit. She was last seen in this clinic on 07/03/2017 by Dr. Nunzio CobbsBobbitt for evaluation of persistanrt asthma, allergic rhinitis, and gastroesophageal reflux.  At that visit she continued on montelukast 10 milligrams once a day, Symbicort 160-2 inhalations twice a day, Allegra 180 mg as needed and Flonase nasal spray as needed.  At today's visit, she reports that she has been feeling symptoms consistent with asthma and allergy exacerbation since she moved to [place] in January 2019.  She reports that she has moved into home with carpeting, mold, and cats beginning in January.  She reports symptoms including shortness of breath and wheezing with rest and activity, cough with clear to yellow thick sputum, postnasal drainage which is clear to yellow and thick, intermittent ear fullness, and sore throat.  [place] in [place] this [place] and reports that her symptoms are much better since returning to this area.  She plans to return to [place] where she will stay until mid May.  She is currently using Symbicort 160-2 puffs twice a day with a spacer and montelukast 10 mg once a day. She is currently using her pro-air inhaler before exercise as well as 2-3 times a week for rescue with some relief.  Allergic rhinitis is reported as moderately well controlled using Allegra 180 mg once a day, Flonase twice a day, and Nettie pot twice a day.  She does report an increase in thick postnasal drainage as well as a runny nose.  Her current medications are listed in the chart.  Drug Allergies:  Allergies    Allergen Reactions  . Breo Ellipta [Fluticasone Furoate-Vilanterol] Other (See Comments)    Thrush  . Peanut-Containing Drug Products Other (See Comments)    Tingling and numbness in throat from tree nuts  . Prednisone Other (See Comments)    Elevated temperature  . Penicillins Rash    Augmentin  . Alcohol-Sulfur [Sulfur]   . Lactose Intolerance (Gi)     Not a true allergy- causes acne breakouts  . Metronidazole     Other reaction(s): Other (See Comments) Seizures and high temp.  . Toradol [Ketorolac Tromethamine]     Syncope, nausea    Physical Exam: BP 116/76   Pulse 80   Resp 20   Wt 198 lb (89.8 kg)   BMI 35.07 kg/m    Physical Exam  Constitutional: She is oriented to person, place, and time. She appears well-developed and well-nourished.  HENT:  Right Ear: External ear normal.  Left Ear: External ear normal.  Bilateral nares slightly erythematous and edematous with clear nasal drainage noted.  Pharynx slightly erythematous with no exudate noted.  Ears normal.  Eyes normal.  Eyes: Conjunctivae are normal.  Neck: Normal range of motion. Neck supple.  Cardiovascular: Normal rate, regular rhythm and normal heart sounds.  No murmur noted  Pulmonary/Chest: Effort normal and breath sounds normal.  Lungs clear to auscultation  Musculoskeletal: Normal range of motion.  Neurological: She is alert and oriented to person, place, and time.  Skin: Skin is warm and dry.  Psychiatric: She  has a normal mood and affect. Her behavior is normal.    Diagnostics: FVC 3.23, FEV1 2.76.  Predicted FVC 2.82, predicted FEV1 2.33.  Spirometry reveals normal ventilatory function.  Assessment and Plan: 1. Moderate persistent asthma without complication   2. Seasonal and perennial allergic rhinitis   3. Seasonal allergic conjunctivitis     Meds ordered this encounter  Medications  . Azelastine HCl 0.15 % SOLN    Sig: Place 2 sprays into both nostrils 2 (two) times daily.     Dispense:  30 mL    Refill:  5  . Fluticasone Propionate (XHANCE) 93 MCG/ACT EXHU    Sig: Place 2 sprays into both nostrils 2 (two) times daily.    Dispense:  32 mL    Refill:  11    [phone] Patient would like automatic refills  . dexamethasone (DECADRON) 0.5 MG tablet    Sig: Take 1 tablet (0.5 mg total) by mouth See admin instructions for 9 days. Take 1 tab 3 times a day for 3 days, then take 1 tab 2 times a day for 3 days, then take 1 tab once a day for 3 days.    Dispense:  18 tablet    Refill:  0    Patient Instructions  Moderate persistent asthma without complication - Symbicort 160 two puffs twice a day with a spacer. Rinse your mouth after each use. - Continue Singulair 10 mg once a day - Continue Levalbuterol 2 puffs every 4 hours as needed for shortness of breath or chest tightness - Decadron     Seasonal and perennial allergic rhinitis/conjunctivitis - Continue allergen avoidance measures - Continue Allegra 180 mg mg once a day as needed for runny nose - Continue fluticasone nasal spray as needed for stuffy nose or instead begin Xhance device - Begin Azelastine nasal spray 2 sprays in each nostril twice a day as needed for sneezing - Mucinex 1200 mg twice a day as needed for thick nasal drainage - Increase fluid intake as tolerated  Follow up in 2 months or sooner if needed      Return in about 2 months (around 11/06/2017), or if symptoms worsen or fail to improve.    Thank you for the opportunity to care for this patient.  Please do not hesitate to contact me with questions.  Thermon Leyland, FNP [facility]

## 2017-09-06 NOTE — Patient Instructions (Addendum)
Moderate persistent asthma without complication - Symbicort 160 two puffs twice a day with a spacer. Rinse your mouth after each use. - Continue Singulair 10 mg once a day - Continue Levalbuterol 2 puffs every 4 hours as needed for shortness of breath or chest tightness - Decadron     Seasonal and perennial allergic rhinitis/conjunctivitis - Continue allergen avoidance measures - Continue Allegra 180 mg mg once a day as needed for runny nose - Continue fluticasone nasal spray as needed for stuffy nose or instead begin Xhance device - Begin Azelastine nasal spray 2 sprays in each nostril twice a day as needed for sneezing - Mucinex 1200 mg twice a day as needed for thick nasal drainage - Increase fluid intake as tolerated  Follow up in 2 months or sooner if needed

## 2017-09-07 DIAGNOSIS — F411 Generalized anxiety disorder: Secondary | ICD-10-CM | POA: Diagnosis not present

## 2017-09-11 ENCOUNTER — Ambulatory Visit (INDEPENDENT_AMBULATORY_CARE_PROVIDER_SITE_OTHER): Payer: BLUE CROSS/BLUE SHIELD | Admitting: Adult Health

## 2017-09-11 ENCOUNTER — Encounter: Payer: Self-pay | Admitting: Adult Health

## 2017-09-11 VITALS — BP 106/74 | HR 85 | Temp 97.5°F | Ht 63.0 in | Wt 207.0 lb

## 2017-09-11 DIAGNOSIS — J060 Acute laryngopharyngitis: Secondary | ICD-10-CM

## 2017-09-11 MED ORDER — AZITHROMYCIN 250 MG PO TABS: ORAL_TABLET | ORAL | 1 refills | Status: AC

## 2017-09-11 MED ORDER — DEXAMETHASONE 0.5 MG PO TABS: 0.5000 mg | ORAL_TABLET | ORAL | 0 refills | Status: AC

## 2017-09-11 MED ORDER — PROMETHAZINE-DM 6.25-15 MG/5ML PO SYRP: 5.0000 mL | ORAL_SOLUTION | Freq: Four times a day (QID) | ORAL | 1 refills | Status: DC | PRN

## 2017-09-11 NOTE — Patient Instructions (Signed)

## 2017-09-11 NOTE — Progress Notes (Signed)
Assessment and Plan:  Lori West was seen today for uri.  Diagnoses and all orders for this visit:  Acute laryngopharyngitis Benign exam- will treat due to hx of progression to bronchitis and pneumonia, follow up in [place] if needed.  Suggested symptomatic OTC remedies. Nasal saline spray for congestion. Nasal steroids, allergy pill, oral steroids Follow up as needed. -     dexamethasone (DECADRON) 0.5 MG tablet; Take 1 tablet (0.5 mg total) by mouth See admin instructions for 9 days. Take 1 tab 3 times a day for 3 days, then take 1 tab 2 times a day for 3 days, then take 1 tab once a day for 3 days. -     azithromycin (ZITHROMAX) 250 MG tablet; Take 2 tablets (500 mg) on  Day 1,  followed by 1 tablet (250 mg) once daily on Days 2 through 5. -     promethazine-dextromethorphan (PROMETHAZINE-DM) 6.25-15 MG/5ML syrup; Take 5 mLs by mouth 4 (four) times daily as needed for cough.  Further disposition pending results of labs. Discussed med's effects and SE's.   Over 15 minutes of exam, counseling, chart review, and critical decision making was performed.   No future appointments.  ------------------------------------------------------------------------------------------------------------------   HPI BP 106/74   Pulse 85   Temp (!) 97.5 F (36.4 C)   Ht 5\' 3"  (1.6 m)   Wt 207 lb (93.9 kg)   SpO2 98%   BMI 36.67 kg/m   21 y.o.female with hx of moderate persistent asthma followed by Dr. Nunzio Cobbs presents for sore throat, nasal congestion, progressive severe productive cough, some wheezing/dyspnea with coughing at night.   Has been using neti- pot, sudafed, and regular medications without improvement.   She reports she just saw pulmonologist at the beginning of onset of symptoms.   She is prescribed symbicort, fluticasone propionate, singulair, levalbuterol and takes allegra and fluticasone for allergy symptoms.   Past Medical History:  Diagnosis Date  . Anxiety   . Asthma   . C.  difficile diarrhea   . Depression   . Gastritis   . Gastropathy    mild reactive  . Headache   . IBS (irritable bowel syndrome)   . Obesity   . Vitamin D deficiency      Allergies  Allergen Reactions  . Breo Ellipta [Fluticasone Furoate-Vilanterol] Other (See Comments)    Thrush  . Peanut-Containing Drug Products Other (See Comments)    Tingling and numbness in throat from tree nuts  . Prednisone Other (See Comments)    Elevated temperature  . Penicillins Rash    Augmentin  . Alcohol-Sulfur [Sulfur]   . Lactose Intolerance (Gi)     Not a true allergy- causes acne breakouts  . Metronidazole     Other reaction(s): Other (See Comments) Seizures and high temp.  . Toradol [Ketorolac Tromethamine]     Syncope, nausea    Current Outpatient Medications on File Prior to Visit  Medication Sig  . Azelastine HCl 0.15 % SOLN Place 2 sprays into both nostrils 2 (two) times daily.  . budesonide-formoterol (SYMBICORT) 160-4.5 MCG/ACT inhaler Inhale 2 puffs into the lungs 2 (two) times daily.  Marland Kitchen buPROPion (WELLBUTRIN SR) 100 MG 12 hr tablet Take 100 mg by mouth 2 (two) times daily.  . Cholecalciferol (VITAMIN D3) 5000 units CAPS Take 1 capsule by mouth daily.  . fexofenadine (ALLEGRA) 180 MG tablet Take 1 tablet (180 mg total) by mouth daily.  . Fluticasone Propionate (XHANCE) 93 MCG/ACT EXHU Place 2 sprays into both nostrils  2 (two) times daily.  Marland Kitchen. levalbuterol (XOPENEX HFA) 45 MCG/ACT inhaler Inhale 2 puffs into the lungs every 4 (four) hours as needed for wheezing.  . montelukast (SINGULAIR) 10 MG tablet Take 1 tablet (10 mg total) by mouth daily.   No current facility-administered medications on file prior to visit.     ROS: all negative except above.   Physical Exam:  BP 106/74   Pulse 85   Temp (!) 97.5 F (36.4 C)   Ht 5\' 3"  (1.6 m)   Wt 207 lb (93.9 kg)   SpO2 98%   BMI 36.67 kg/m   General Appearance: Well nourished, in no apparent distress. Eyes: PERRLA, EOMs,  conjunctiva no swelling or erythema Sinuses: No Frontal/maxillary tenderness ENT/Mouth: Ext aud canals clear, TMs without erythema, bulging. No erythema, swelling, or exudate on post pharynx.  Tonsils not swollen or erythematous. Hearing normal.  Neck: Supple, thyroid normal.  Respiratory: Respiratory effort normal, BS equal bilaterally without rales, rhonchi, wheezing or stridor.  Cardio: RRR with no MRGs. Brisk peripheral pulses without edema.  Abdomen: Soft, + BS.  Non tender, no guarding, rebound, hernias, masses. Lymphatics: Non tender without lymphadenopathy.  Musculoskeletal: Full ROM, 5/5 strength, normal gait.  Skin: Warm, dry without rashes, lesions, ecchymosis.  Neuro: Cranial nerves intact. Normal muscle tone, no cerebellar symptoms. Sensation intact.  Psych: Awake and oriented X 3, normal affect, Insight and Judgment appropriate.     Dan MakerAshley C Chadley Dziedzic, NP 4:26 PM [facility]

## 2017-09-12 DIAGNOSIS — F411 Generalized anxiety disorder: Secondary | ICD-10-CM | POA: Diagnosis not present

## 2017-10-09 DIAGNOSIS — F411 Generalized anxiety disorder: Secondary | ICD-10-CM | POA: Diagnosis not present

## 2017-10-31 DIAGNOSIS — F411 Generalized anxiety disorder: Secondary | ICD-10-CM | POA: Diagnosis not present

## 2017-11-06 DIAGNOSIS — F411 Generalized anxiety disorder: Secondary | ICD-10-CM | POA: Diagnosis not present

## 2017-11-14 DIAGNOSIS — Z713 Dietary counseling and surveillance: Secondary | ICD-10-CM | POA: Diagnosis not present

## 2017-11-14 DIAGNOSIS — F411 Generalized anxiety disorder: Secondary | ICD-10-CM | POA: Diagnosis not present

## 2017-11-20 DIAGNOSIS — F411 Generalized anxiety disorder: Secondary | ICD-10-CM | POA: Diagnosis not present

## 2017-11-22 DIAGNOSIS — F411 Generalized anxiety disorder: Secondary | ICD-10-CM | POA: Diagnosis not present

## 2017-11-27 DIAGNOSIS — F411 Generalized anxiety disorder: Secondary | ICD-10-CM | POA: Diagnosis not present

## 2017-12-04 DIAGNOSIS — F411 Generalized anxiety disorder: Secondary | ICD-10-CM | POA: Diagnosis not present

## 2017-12-06 ENCOUNTER — Other Ambulatory Visit: Payer: Self-pay | Admitting: Radiology

## 2017-12-06 DIAGNOSIS — N6459 Other signs and symptoms in breast: Secondary | ICD-10-CM | POA: Diagnosis not present

## 2017-12-06 DIAGNOSIS — N644 Mastodynia: Secondary | ICD-10-CM

## 2017-12-11 DIAGNOSIS — F411 Generalized anxiety disorder: Secondary | ICD-10-CM | POA: Diagnosis not present

## 2017-12-13 DIAGNOSIS — Z713 Dietary counseling and surveillance: Secondary | ICD-10-CM | POA: Diagnosis not present

## 2017-12-14 ENCOUNTER — Ambulatory Visit
Admission: RE | Admit: 2017-12-14 | Discharge: 2017-12-14 | Disposition: A | Discharge status: Home / Self Care | Payer: BLUE CROSS/BLUE SHIELD | Source: Ambulatory Visit | Attending: Radiology | Admitting: Radiology

## 2017-12-14 DIAGNOSIS — N644 Mastodynia: Secondary | ICD-10-CM

## 2017-12-17 NOTE — Progress Notes (Signed)
Assessment and Plan:  Lori DavenportShelby was seen today for acute visit and fatigue.  Diagnoses and all orders for this visit:  Fatigue, unspecified type Appears mild, will check basic labs, follow up PRN if initial workup inconclusive and symptoms progressive/lasting longer than 3-4 more weeks, or with new symptoms.  -     CBC with Differential/Platelet; Future -     Vitamin B12; Future -     COMPLETE METABOLIC PANEL WITH GFR; Future -     Urinalysis w microscopic + reflex cultur       -     She also has pending thyroid studies ordered; encouraged her to get these done at the same time when she returns to have labs drawn tomorrow AM (patient preference to not have done today)   Easy bruising Very limited bruising; no other signs of excessive bleeding, will check basic labs only, follow up labs PRN  -     CBC with Differential/Platelet; Future -     Protime-INR; Future  Further disposition pending results of labs. Discussed med's effects and SE's.   Over 15 minutes of exam, counseling, chart review, and critical decision making was performed.   No future appointments.  ------------------------------------------------------------------------------------------------------------------  HPI BP 118/72   Pulse 99   Temp (!) 97.3 F (36.3 C)   Resp 16   Ht 5\' 3"  (1.6 m)   Wt 200 lb (90.7 kg)   SpO2 99%   BMI 35.43 kg/m   22 y.o.female with hx of asthma, allergies, depression/anxiety presents for evaluation of bruising without known cause intermittently for 1 month or so; she reports unexplained bruises to torso and thighs, proximal extremities; each area is small, 2-3 cm by photo she presents. No active bruises at this time. She denies any other bleeding;  She is on mirena, no cycle in over a year, denies new spotting or breakthrough bleeding. She denies excessive bleeding from gums, hematuria, dark stools. She does endorse increased fatigue over this past month.   She has not taken ASA or  NSAIDs, not on SSRI. No other new symptoms.    Past Medical History:  Diagnosis Date  . Anxiety   . Asthma   . C. difficile diarrhea   . Depression   . Gastritis   . Gastropathy    mild reactive  . Headache   . IBS (irritable bowel syndrome)   . Obesity   . Vitamin D deficiency      Allergies  Allergen Reactions  . Breo Ellipta [Fluticasone Furoate-Vilanterol] Other (See Comments)    Thrush  . Peanut-Containing Drug Products Other (See Comments)    Tingling and numbness in throat from tree nuts  . Prednisone Other (See Comments)    Elevated temperature  . Penicillins Rash    Augmentin  . Alcohol-Sulfur [Sulfur]   . Lactose Intolerance (Gi)     Not a true allergy- causes acne breakouts  . Metronidazole     Other reaction(s): Other (See Comments) Seizures and high temp.  . Toradol [Ketorolac Tromethamine]     Syncope, nausea    Current Outpatient Medications on File Prior to Visit  Medication Sig  . budesonide-formoterol (SYMBICORT) 160-4.5 MCG/ACT inhaler Inhale 2 puffs into the lungs 2 (two) times daily.  Marland Kitchen. buPROPion (WELLBUTRIN SR) 100 MG 12 hr tablet Take 100 mg by mouth 2 (two) times daily.  . Cholecalciferol (VITAMIN D3) 5000 units CAPS Take 1 capsule by mouth daily.  . fexofenadine (ALLEGRA) 180 MG tablet Take 1 tablet (180  mg total) by mouth daily.  Marland Kitchen levalbuterol (XOPENEX HFA) 45 MCG/ACT inhaler Inhale 2 puffs into the lungs every 4 (four) hours as needed for wheezing.  . montelukast (SINGULAIR) 10 MG tablet Take 1 tablet (10 mg total) by mouth daily.   No current facility-administered medications on file prior to visit.     ROS: Review of Systems  Constitutional: Positive for diaphoresis ("hot flashes" - ongoing since childhood but worse recently) and malaise/fatigue. Negative for chills, fever and weight loss.  HENT: Negative.   Eyes: Negative for blurred vision and double vision.  Respiratory: Negative for cough, shortness of breath and wheezing.    Cardiovascular: Negative for chest pain, palpitations, orthopnea, claudication, leg swelling and PND.  Gastrointestinal: Negative for abdominal pain, blood in stool, constipation, diarrhea, heartburn, melena, nausea and vomiting.  Genitourinary: Negative for dysuria, flank pain, frequency, hematuria and urgency.  Musculoskeletal: Negative for back pain, joint pain and myalgias.  Skin: Negative for itching and rash.  Neurological: Positive for dizziness (Has dizziness with hot flashes). Negative for tremors, sensory change, speech change, focal weakness and headaches (Has migraines twice a month).  Endo/Heme/Allergies: Positive for environmental allergies. Bruises/bleeds easily.    Physical Exam:  BP 118/72   Pulse 99   Temp (!) 97.3 F (36.3 C)   Resp 16   Ht 5\' 3"  (1.6 m)   Wt 200 lb (90.7 kg)   SpO2 99%   BMI 35.43 kg/m   General Appearance: Well nourished, in no apparent distress. Eyes: PERRLA, EOMs, conjunctiva no swelling or erythema Sinuses: No Frontal/maxillary tenderness ENT/Mouth: Ext aud canals clear, TMs without erythema, bulging. No erythema, swelling, or exudate on post pharynx.  Tonsils not swollen or erythematous. Hearing normal.  Neck: Supple, thyroid normal.  Respiratory: Respiratory effort normal, BS equal bilaterally without rales, rhonchi, wheezing or stridor.  Cardio: RRR with no MRGs. Brisk peripheral pulses without edema.  Abdomen: Soft, + BS.  Non tender, no guarding, rebound, hernias, masses. Lymphatics: Non tender without lymphadenopathy.  Musculoskeletal: Full ROM, 5/5 strength, normal gait.  Skin: Warm, dry without rashes, lesions, ecchymosis.  Neuro: Cranial nerves intact. Normal muscle tone, no cerebellar symptoms. Sensation intact.  Psych: Awake and oriented X 3, normal affect, Insight and Judgment appropriate.     Dan Maker, NP 5:26 PM [facility]

## 2017-12-18 ENCOUNTER — Ambulatory Visit (INDEPENDENT_AMBULATORY_CARE_PROVIDER_SITE_OTHER): Payer: BLUE CROSS/BLUE SHIELD | Admitting: Adult Health

## 2017-12-18 ENCOUNTER — Encounter: Payer: Self-pay | Admitting: Adult Health

## 2017-12-18 VITALS — BP 118/72 | HR 99 | Temp 97.3°F | Resp 16 | Ht 63.0 in | Wt 200.0 lb

## 2017-12-18 DIAGNOSIS — R5383 Other fatigue: Secondary | ICD-10-CM | POA: Diagnosis not present

## 2017-12-18 DIAGNOSIS — R238 Other skin changes: Secondary | ICD-10-CM | POA: Diagnosis not present

## 2017-12-18 DIAGNOSIS — R233 Spontaneous ecchymoses: Secondary | ICD-10-CM

## 2017-12-18 NOTE — Patient Instructions (Signed)
Come back tomorrow morning for labs - make sure they do the thyroid studies that Marchelle Folksmanda wanted drawn as well  If nothing shows up, see how you do over the next several weeks - if fatigue is severe and getting worse, let me know via mychart and we can check some of the more unusual things   Fatigue Fatigue is feeling tired all of the time, a lack of energy, or a lack of motivation. Occasional or mild fatigue is often a normal response to activity or life in general. However, long-lasting (chronic) or extreme fatigue may indicate an underlying medical condition. Follow these instructions at home: Watch your fatigue for any changes. The following actions may help to lessen any discomfort you are feeling:  Talk to your health care provider about how much sleep you need each night. Try to get the required amount every night.  Take medicines only as directed by your health care provider.  Eat a healthy and nutritious diet. Ask your health care provider if you need help changing your diet.  Drink enough fluid to keep your urine clear or pale yellow.  Practice ways of relaxing, such as yoga, meditation, massage therapy, or acupuncture.  Exercise regularly.  Change situations that cause you stress. Try to keep your work and personal routine reasonable.  Do not abuse illegal drugs.  Limit alcohol intake to no more than 1 drink per day for nonpregnant women and 2 drinks per day for men. One drink equals 12 ounces of beer, 5 ounces of wine, or 1 ounces of hard liquor.  Take a multivitamin, if directed by your health care provider.  Contact a health care provider if:  Your fatigue does not get better.  You have a fever.  You have unintentional weight loss or gain.  You have headaches.  You have difficulty: ? Falling asleep. ? Sleeping throughout the night.  You feel angry, guilty, anxious, or sad.  You are unable to have a bowel movement (constipation).  You skin is dry.  Your  legs or another part of your body is swollen. Get help right away if:  You feel confused.  Your vision is blurry.  You feel faint or pass out.  You have a severe headache.  You have severe abdominal, pelvic, or back pain.  You have chest pain, shortness of breath, or an irregular or fast heartbeat.  You are unable to urinate or you urinate less than normal.  You develop abnormal bleeding, such as bleeding from the rectum, vagina, nose, lungs, or nipples.  You vomit blood.  You have thoughts about harming yourself or committing suicide.  You are worried that you might harm someone else. This information is not intended to replace advice given to you by your health care provider. Make sure you discuss any questions you have with your health care provider. Document Released: 04/03/2007 Document Revised: 11/12/2015 Document Reviewed: 10/08/2013 Elsevier Interactive Patient Education  [employer]2018 [employer]

## 2017-12-19 ENCOUNTER — Other Ambulatory Visit: Payer: BLUE CROSS/BLUE SHIELD

## 2017-12-19 DIAGNOSIS — R233 Spontaneous ecchymoses: Secondary | ICD-10-CM

## 2017-12-19 DIAGNOSIS — R5383 Other fatigue: Secondary | ICD-10-CM

## 2017-12-19 DIAGNOSIS — R238 Other skin changes: Secondary | ICD-10-CM

## 2017-12-19 DIAGNOSIS — Z8349 Family history of other endocrine, nutritional and metabolic diseases: Secondary | ICD-10-CM

## 2017-12-19 DIAGNOSIS — F411 Generalized anxiety disorder: Secondary | ICD-10-CM | POA: Diagnosis not present

## 2017-12-19 LAB — URINALYSIS W MICROSCOPIC + REFLEX CULTURE
BACTERIA UA: NONE SEEN /HPF
Bilirubin Urine: NEGATIVE
GLUCOSE, UA: NEGATIVE
HGB URINE DIPSTICK: NEGATIVE
Hyaline Cast: NONE SEEN /LPF
KETONES UR: NEGATIVE
Leukocyte Esterase: NEGATIVE
NITRITES URINE, INITIAL: NEGATIVE
PH: 7.5 (ref 5.0–8.0)
PROTEIN: NEGATIVE
RBC / HPF: NONE SEEN /HPF (ref 0–2)
SPECIFIC GRAVITY, URINE: 1.013 (ref 1.001–1.03)
Squamous Epithelial / LPF: NONE SEEN /HPF (ref <=5)
WBC UA: NONE SEEN /HPF (ref 0–5)

## 2017-12-19 LAB — NO CULTURE INDICATED

## 2017-12-20 DIAGNOSIS — Z01419 Encounter for gynecological examination (general) (routine) without abnormal findings: Secondary | ICD-10-CM | POA: Diagnosis not present

## 2017-12-20 DIAGNOSIS — Z6835 Body mass index (BMI) 35.0-35.9, adult: Secondary | ICD-10-CM | POA: Diagnosis not present

## 2017-12-20 LAB — CBC WITH DIFFERENTIAL/PLATELET
BASOS PCT: 0.6 %
Basophils Absolute: 41 cells/uL (ref 0–200)
Eosinophils Absolute: 102 cells/uL (ref 15–500)
Eosinophils Relative: 1.5 %
HEMATOCRIT: 42.2 % (ref 35.0–45.0)
Hemoglobin: 14.4 g/dL (ref 11.7–15.5)
LYMPHS ABS: 2890 {cells}/uL (ref 850–3900)
MCH: 31.2 pg (ref 27.0–33.0)
MCHC: 34.1 g/dL (ref 32.0–36.0)
MCV: 91.5 fL (ref 80.0–100.0)
MPV: 10.5 fL (ref 7.5–12.5)
Monocytes Relative: 8.6 %
Neutro Abs: 3182 cells/uL (ref 1500–7800)
Neutrophils Relative %: 46.8 %
PLATELETS: 314 10*3/uL (ref 140–400)
RBC: 4.61 10*6/uL (ref 3.80–5.10)
RDW: 12 % (ref 11.0–15.0)
TOTAL LYMPHOCYTE: 42.5 %
WBC: 6.8 10*3/uL (ref 3.8–10.8)
WBCMIX: 585 {cells}/uL (ref 200–950)

## 2017-12-20 LAB — COMPLETE METABOLIC PANEL WITH GFR
AG Ratio: 2.1 (calc) (ref 1.0–2.5)
ALT: 8 U/L (ref 6–29)
AST: 15 U/L (ref 10–30)
Albumin: 4.8 g/dL (ref 3.6–5.1)
Alkaline phosphatase (APISO): 103 U/L (ref 33–115)
BUN: 8 mg/dL (ref 7–25)
CALCIUM: 9.9 mg/dL (ref 8.6–10.2)
CO2: 28 mmol/L (ref 20–32)
CREATININE: 0.69 mg/dL (ref 0.50–1.10)
Chloride: 103 mmol/L (ref 98–110)
GFR, Est African American: 142 mL/min/{1.73_m2} (ref >=60)
GFR, Est Non African American: 123 mL/min/{1.73_m2} (ref >=60)
Globulin: 2.3 g/dL (calc) (ref 1.9–3.7)
Glucose, Bld: 94 mg/dL (ref 65–99)
Potassium: 4.2 mmol/L (ref 3.5–5.3)
Sodium: 138 mmol/L (ref 135–146)
Total Bilirubin: 0.7 mg/dL (ref 0.2–1.2)
Total Protein: 7.1 g/dL (ref 6.1–8.1)

## 2017-12-20 LAB — THYROGLOBULIN ANTIBODY

## 2017-12-20 LAB — VITAMIN B12: Vitamin B-12: 263 pg/mL (ref 200–1100)

## 2017-12-20 LAB — PROTIME-INR
INR: 1
Prothrombin Time: 10.4 s (ref 9.0–11.5)

## 2017-12-20 LAB — THYROID PEROXIDASE ANTIBODY: Thyroperoxidase Ab SerPl-aCnc: 1 IU/mL (ref <9)

## 2017-12-20 LAB — T4, FREE: Free T4: 1.3 ng/dL (ref 0.8–1.8)

## 2017-12-20 LAB — TSH: TSH: 2.85 mIU/L

## 2017-12-21 ENCOUNTER — Other Ambulatory Visit: Payer: Self-pay | Admitting: Internal Medicine

## 2017-12-21 MED ORDER — FLUCONAZOLE 150 MG PO TABS: ORAL_TABLET | ORAL | 1 refills | Status: DC

## 2017-12-25 DIAGNOSIS — F411 Generalized anxiety disorder: Secondary | ICD-10-CM | POA: Diagnosis not present

## 2017-12-26 ENCOUNTER — Telehealth: Payer: Self-pay | Admitting: *Deleted

## 2017-12-26 MED ORDER — AZITHROMYCIN 250 MG PO TABS: ORAL_TABLET | ORAL | 0 refills | Status: AC

## 2017-12-26 NOTE — Telephone Encounter (Signed)
Patient called and complained of a sore throat, sinus congestion and aproductive cough.  Per Dr Oneta RackMcKeown, an RX for a Z-pak was sent to the patient's pharmacy.  The patient is aware.

## 2017-12-27 DIAGNOSIS — F411 Generalized anxiety disorder: Secondary | ICD-10-CM | POA: Diagnosis not present

## 2018-01-01 DIAGNOSIS — F411 Generalized anxiety disorder: Secondary | ICD-10-CM | POA: Diagnosis not present

## 2018-01-04 NOTE — Progress Notes (Signed)
Assessment and Plan:  Post-viral cough syndrome Exam unremarkable, reassured, likely irritative cough post viral URI Advised to rotate allergy medication Start taking decadron x 5 days and cough medication, voice rest, hydration/immune support Take zpak if not improving in 5 days.  Follow up with any questions/concerns/new symptoms  Further disposition pending results of labs. Discussed med's effects and SE's.   Over 15 minutes of exam, counseling, chart review, and critical decision making was performed.   Future Appointments  Date Time Provider Department Center  02/05/2018 10:30 AM Anson Fret, MD [facility] None    ------------------------------------------------------------------------------------------------------------------   HPI BP 110/78   Pulse 74   Temp 97.9 F (36.6 C)   Ht 5\' 3"  (1.6 m)   Wt 202 lb (91.6 kg)   SpO2 97%   BMI 35.78 kg/m   22 y.o.female with hx of asthma presents for evaluation of a sore throat, sinus congestion and productive cough ongoing since 12/20/2017, called office on 12/26/2017 and zpak was prescribed but didn't take as she didn't take as she wanted to be seen first (hx of recurrent c. Diff and prefers not to take abx). She reports she woke up with a productive cough, took guaifenasin and was helping, had body aches but this has since resolved, now just has hoarseness, new sinus pressure x 1 day, still having productive cough (alternates between clear and thick yellow).   She has promethazine-DM but hasn't been taking. She also has decadron at home.   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   She is on symbicort, singulaire and allegra.   Past Medical History:  Diagnosis Date  . Anxiety   . Asthma   . C. difficile diarrhea   . Depression   . Gastritis   . Gastropathy    mild reactive  . Headache   . IBS (irritable bowel syndrome)   . Obesity   . Vitamin D deficiency      Allergies  Allergen Reactions  . Breo Ellipta [Fluticasone  Furoate-Vilanterol] Other (See Comments)    Thrush  . Peanut-Containing Drug Products Other (See Comments)    Tingling and numbness in throat from tree nuts  . Prednisone Other (See Comments)    Elevated temperature  . Penicillins Rash    Augmentin  . Alcohol-Sulfur [Sulfur]   . Lactose Intolerance (Gi)     Not a true allergy- causes acne breakouts  . Metronidazole     Other reaction(s): Other (See Comments) Seizures and high temp.  . Toradol [Ketorolac Tromethamine]     Syncope, nausea    Current Outpatient Medications on File Prior to Visit  Medication Sig  . budesonide-formoterol (SYMBICORT) 160-4.5 MCG/ACT inhaler Inhale 2 puffs into the lungs 2 (two) times daily.  Marland Kitchen buPROPion (WELLBUTRIN SR) 100 MG 12 hr tablet Take 100 mg by mouth 2 (two) times daily.  Marland Kitchen buPROPion (WELLBUTRIN XL) 150 MG 24 hr tablet Take 150 mg by mouth daily.  . Cholecalciferol (VITAMIN D3) 5000 units CAPS Take 1 capsule by mouth daily.  . fexofenadine (ALLEGRA) 180 MG tablet Take 1 tablet (180 mg total) by mouth daily.  . fluconazole (DIFLUCAN) 150 MG tablet Take 1 tablet 2 x/week as needed for yeast infection  . levalbuterol (XOPENEX HFA) 45 MCG/ACT inhaler Inhale 2 puffs into the lungs every 4 (four) hours as needed for wheezing.  . montelukast (SINGULAIR) 10 MG tablet Take 1 tablet (10 mg total) by mouth daily.   No current facility-administered medications on file prior to visit.  ROS: all negative except above.   Physical Exam:  BP 110/78   Pulse 74   Temp 97.9 F (36.6 C)   Ht 5\' 3"  (1.6 m)   Wt 202 lb (91.6 kg)   SpO2 97%   BMI 35.78 kg/m   General Appearance: Well nourished, in no apparent distress. Eyes: PERRLA, conjunctiva no swelling or erythema Sinuses: No Frontal/maxillary tenderness ENT/Mouth: Ext aud canals clear, TMs without erythema, bulging. No erythema, swelling, or exudate on post pharynx.  Tonsils not swollen or erythematous. Hearing normal.  Neck: Supple, thyroid  normal.  Respiratory: Respiratory effort normal, BS equal bilaterally without rales, rhonchi, wheezing or stridor.  Cardio: RRR with no MRGs. Brisk peripheral pulses without edema.  Abdomen: Soft, + BS.  Non tender. Lymphatics: Non tender without lymphadenopathy.  Musculoskeletal: Symmetrical strength, normal gait.  Skin: Warm, dry without rashes, lesions, ecchymosis.  Neuro: Cranial nerves intact. Normal muscle tone Psych: Awake and oriented X 3, normal affect, Insight and Judgment appropriate.     Dan MakerAshley C Yashira Offenberger, NP 11:16 AM [facility]

## 2018-01-05 ENCOUNTER — Encounter: Payer: Self-pay | Admitting: Adult Health

## 2018-01-05 ENCOUNTER — Ambulatory Visit (INDEPENDENT_AMBULATORY_CARE_PROVIDER_SITE_OTHER): Payer: BLUE CROSS/BLUE SHIELD | Admitting: Adult Health

## 2018-01-05 VITALS — BP 110/78 | HR 74 | Temp 97.9°F | Ht 63.0 in | Wt 202.0 lb

## 2018-01-05 DIAGNOSIS — R05 Cough: Secondary | ICD-10-CM | POA: Diagnosis not present

## 2018-01-05 DIAGNOSIS — R058 Other specified cough: Secondary | ICD-10-CM

## 2018-01-05 NOTE — Patient Instructions (Signed)
Get on decadron, voice over the weekend, soothing drinks, cough drops, take the cough medication to help rest your throat. Take zpak if not better in 5 days.     Common causes of cough OR hoarseness OR sore throat:   Allergies, Viral Infections, Acid Reflux and Bacterial Infections.   Allergies and viral infections cause a cough OR sore throat by post nasal drip and are often worse at night, can also have sneezing, lower grade fevers, clear/yellow mucus. This is best treated with allergy medications or nasal sprays.  Please get on allegra for 1-2 weeks The strongest is allegra or fexafinadine  Cheapest at [employer], [employer], [employer]   Bacterial infections are more severe than allergies or viral infections with fever, teeth pain, fatigue. This can be treated with prednisone and the same over the counter medication and after 7 days can be treated with an antibiotic.   Silent reflux/GERD can cause a cough OR sore throat OR hoarseness WITHOUT heart burn because the esophagus that goes to the stomach and trachea that goes to the lungs are very close and when you lay down the acid can irritate your throat and lungs. This can cause hoarseness, cough, and wheezing. Please stop any alcohol or anti-inflammatories like aleve/advil/ibuprofen and start an over the counter Prilosec or omeprazole 1-2 times daily before food for 2 weeks, then switch to over the counter zantac/ratinidine or pepcid/famotadine once at night for 2 weeks.    sometimes irritation causes more irritation. Try voice rest, use sugar free cough drops to prevent coughing, and try to stop clearing your throat.   If you ever have a cough that does not go away after trying these things please make a follow up visit for further evaluation or we can refer you to a specialist. Or if you ever have shortness of breath or chest pain go to the ER.

## 2018-01-08 DIAGNOSIS — F411 Generalized anxiety disorder: Secondary | ICD-10-CM | POA: Diagnosis not present

## 2018-01-11 DIAGNOSIS — F411 Generalized anxiety disorder: Secondary | ICD-10-CM | POA: Diagnosis not present

## 2018-01-16 ENCOUNTER — Encounter: Payer: Self-pay | Admitting: Adult Health

## 2018-01-25 DIAGNOSIS — Z713 Dietary counseling and surveillance: Secondary | ICD-10-CM | POA: Diagnosis not present

## 2018-01-25 NOTE — Progress Notes (Signed)
Assessment and Plan:  Lori West was seen today for cough.  Diagnoses and all orders for this visit:  Bronchitis Add mucinex, hydrate, steam baths if necessary, continue asthma/allergy medications, follow up with asthma/allergy center if not resolving in 1-2 weeks.  -     promethazine-dextromethorphan (PROMETHAZINE-DM) 6.25-15 MG/5ML syrup; Take 5 mLs by mouth 4 (four) times daily as needed for cough. -     dexamethasone (DECADRON) 0.5 MG tablet; then take 2 tablets PO QHS for 3 days, then take 1 tablet QHS for 4 days.  Further disposition pending results of labs. Discussed med's effects and SE's.   Over 15 minutes of exam, counseling, chart review, and critical decision making was performed.   Future Appointments  Date Time Provider Department Center  02/05/2018 10:30 AM Anson FretAhern, Lori B, MD [facility] None    ------------------------------------------------------------------------------------------------------------------   HPI BP 106/72   Pulse 98   Temp 97.7 F (36.5 C)   Ht 5\' 3"  (1.6 m)   Wt 203 lb (92.1 kg)   SpO2 97%   BMI 35.96 kg/m   22 y.o.female presents for evaluation for ongoing URI symptoms since 5-6 weeks ago. Started with dry cough, progressed to mucus, irritation in throat. Denies nasal congestion, sinus pressure, severe sore throat, fever/chills.   She is having coughing fits, having some wheezing/shortness of breath secondary to this.   She is on symbicort, levalubuterol and singulair, followed by allergy and asthma center  She has completed a course of decadron, zpak without improvement. Hasn't taken any cough medications.   Past Medical History:  Diagnosis Date  . Anxiety   . Asthma   . C. difficile diarrhea   . Depression   . Gastritis   . Gastropathy    mild reactive  . Headache   . IBS (irritable bowel syndrome)   . Obesity   . Vitamin D deficiency      Allergies  Allergen Reactions  . Breo Ellipta [Fluticasone Furoate-Vilanterol] Other (See  Comments)    Thrush  . Peanut-Containing Drug Products Other (See Comments)    Tingling and numbness in throat from tree nuts  . Prednisone Other (See Comments)    Elevated temperature  . Penicillins Rash    Augmentin  . Alcohol-Sulfur [Sulfur]   . Lactose Intolerance (Gi)     Not a true allergy- causes acne breakouts  . Metronidazole     Other reaction(s): Other (See Comments) Seizures and high temp.  . Toradol [Ketorolac Tromethamine]     Syncope, nausea    Current Outpatient Medications on File Prior to Visit  Medication Sig  . budesonide-formoterol (SYMBICORT) 160-4.5 MCG/ACT inhaler Inhale 2 puffs into the lungs 2 (two) times daily.  Marland Kitchen. buPROPion (WELLBUTRIN XL) 150 MG 24 hr tablet Take 150 mg by mouth daily.  . Cholecalciferol (VITAMIN D3) 5000 units CAPS Take 1 capsule by mouth daily.  Marland Kitchen. levalbuterol (XOPENEX HFA) 45 MCG/ACT inhaler Inhale 2 puffs into the lungs every 4 (four) hours as needed for wheezing.  Marland Kitchen. loratadine (CLARITIN) 10 MG tablet Take 10 mg by mouth daily.  . montelukast (SINGULAIR) 10 MG tablet Take 1 tablet (10 mg total) by mouth daily.  . fexofenadine (ALLEGRA) 180 MG tablet Take 1 tablet (180 mg total) by mouth daily. (Patient not taking: Reported on 01/26/2018)   No current facility-administered medications on file prior to visit.     ROS: all negative except above.   Physical Exam:  BP 106/72   Pulse 98   Temp 97.7 F (  36.5 C)   Ht 5\' 3"  (1.6 m)   Wt 203 lb (92.1 kg)   SpO2 97%   BMI 35.96 kg/m   General Appearance: Well nourished, in no apparent distress. Eyes: PERRLA, EOMs, conjunctiva no swelling or erythema Sinuses: No Frontal/maxillary tenderness ENT/Mouth: Ext aud canals clear, TMs without erythema, bulging. No erythema, swelling, or exudate on post pharynx.  Tonsils not swollen or erythematous. Hearing normal.  Neck: Supple, thyroid normal.  Respiratory: Respiratory effort normal, BS equal bilaterally without rales, rhonchi, wheezing or  stridor.  Cardio: RRR with no MRGs. Brisk peripheral pulses without edema.  Abdomen: Soft, + BS.  Non tender, no guarding, rebound, hernias, masses. Lymphatics: Non tender without lymphadenopathy.  Musculoskeletal: Full ROM, 5/5 strength, normal gait.  Skin: Warm, dry without rashes, lesions, ecchymosis.  Psych: Awake and oriented X 3, normal affect, Insight and Judgment appropriate.     Dan Maker, NP 10:33 AM [name]

## 2018-01-26 ENCOUNTER — Encounter: Payer: Self-pay | Admitting: Adult Health

## 2018-01-26 ENCOUNTER — Ambulatory Visit (INDEPENDENT_AMBULATORY_CARE_PROVIDER_SITE_OTHER): Payer: BLUE CROSS/BLUE SHIELD | Admitting: Adult Health

## 2018-01-26 VITALS — BP 106/72 | HR 98 | Temp 97.7°F | Ht 63.0 in | Wt 203.0 lb

## 2018-01-26 DIAGNOSIS — J40 Bronchitis, not specified as acute or chronic: Secondary | ICD-10-CM

## 2018-01-26 MED ORDER — DEXAMETHASONE 0.5 MG PO TABS: ORAL_TABLET | ORAL | 0 refills | Status: DC

## 2018-01-26 MED ORDER — PROMETHAZINE-DM 6.25-15 MG/5ML PO SYRP: 5.0000 mL | ORAL_SOLUTION | Freq: Four times a day (QID) | ORAL | 1 refills | Status: DC | PRN

## 2018-01-26 NOTE — Patient Instructions (Signed)

## 2018-01-31 DIAGNOSIS — F411 Generalized anxiety disorder: Secondary | ICD-10-CM | POA: Diagnosis not present

## 2018-02-01 DIAGNOSIS — F411 Generalized anxiety disorder: Secondary | ICD-10-CM | POA: Diagnosis not present

## 2018-02-05 ENCOUNTER — Ambulatory Visit (INDEPENDENT_AMBULATORY_CARE_PROVIDER_SITE_OTHER): Payer: BLUE CROSS/BLUE SHIELD | Admitting: Neurology

## 2018-02-05 ENCOUNTER — Encounter: Payer: Self-pay | Admitting: Neurology

## 2018-02-05 ENCOUNTER — Encounter

## 2018-02-05 VITALS — BP 114/71 | HR 73 | Ht 63.0 in | Wt 201.0 lb

## 2018-02-05 DIAGNOSIS — G43701 Chronic migraine without aura, not intractable, with status migrainosus: Secondary | ICD-10-CM | POA: Diagnosis not present

## 2018-02-05 DIAGNOSIS — G43709 Chronic migraine without aura, not intractable, without status migrainosus: Secondary | ICD-10-CM

## 2018-02-05 DIAGNOSIS — E669 Obesity, unspecified: Secondary | ICD-10-CM

## 2018-02-05 DIAGNOSIS — H9313 Tinnitus, bilateral: Secondary | ICD-10-CM | POA: Diagnosis not present

## 2018-02-05 MED ORDER — ERENUMAB-AOOE 140 MG/ML ~~LOC~~ SOAJ: 140.0000 mg | SUBCUTANEOUS | 11 refills | Status: DC
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
## 2018-02-05 NOTE — Patient Instructions (Signed)

## 2018-02-06 DIAGNOSIS — F411 Generalized anxiety disorder: Secondary | ICD-10-CM | POA: Diagnosis not present

## 2018-02-07 ENCOUNTER — Telehealth: Payer: Self-pay

## 2018-02-07 ENCOUNTER — Ambulatory Visit (INDEPENDENT_AMBULATORY_CARE_PROVIDER_SITE_OTHER): Payer: BLUE CROSS/BLUE SHIELD | Admitting: Allergy & Immunology

## 2018-02-07 ENCOUNTER — Encounter: Payer: Self-pay | Admitting: Allergy & Immunology

## 2018-02-07 ENCOUNTER — Ambulatory Visit: Payer: BLUE CROSS/BLUE SHIELD | Admitting: Family Medicine

## 2018-02-07 VITALS — BP 96/62 | HR 86 | Ht 64.0 in | Wt 201.8 lb

## 2018-02-07 DIAGNOSIS — J302 Other seasonal allergic rhinitis: Secondary | ICD-10-CM

## 2018-02-07 DIAGNOSIS — F411 Generalized anxiety disorder: Secondary | ICD-10-CM | POA: Diagnosis not present

## 2018-02-07 DIAGNOSIS — B370 Candidal stomatitis: Secondary | ICD-10-CM

## 2018-02-07 DIAGNOSIS — J3089 Other allergic rhinitis: Secondary | ICD-10-CM

## 2018-02-07 DIAGNOSIS — B999 Unspecified infectious disease: Secondary | ICD-10-CM

## 2018-02-07 DIAGNOSIS — J4540 Moderate persistent asthma, uncomplicated: Secondary | ICD-10-CM

## 2018-02-07 NOTE — Patient Instructions (Addendum)
1. Moderate persistent asthma without complication - Lung testing looks normal today. - I think that you are continuing to have uncontrolled asthma, so an injectable medication could be helpful.  - Start Spiriva two puffs once daily in addition to your Symbicort. - Daily controller medication(s): Singulair 10mg  daily, Symbicort 160/4.625mcg two puffs twice daily with spacer and Spiriva 1.1025mcg two puffs once daily - Prior to physical activity: Xopenex 2 puffs 10-15 minutes before physical activity. - Rescue medications: Xopenex 4 puffs every 4-6 hours as needed - Asthma control goals:  * Full participation in all desired activities (may need albuterol before activity) * Albuterol use two time or less a week on average (not counting use with activity) * Cough interfering with sleep two time or less a month * Oral steroids no more than once a year * No hospitalizations  2. Seasonal and perennial allergic rhinitis - Continue with azelastine two sprays per nostril up to twice daily. - Restart Flonase two sprays per nostril up to twice daily.  - We will recheck your allergies via your blood.   3. Oral candidiasis - Start the fluconazole that you have on hand.  - Continue with mouth washing.   4. Recurrent infections - We will obtain some screening labs to evaluate your immune system.  - Labs to evaluate the quantitative aspects of your immune system: IgG/IgA/IgM, CBC with differential - Labs to evaluate the qualitative aspects of your immune system: CH50, Pneumococcal titers, Tetanus titers, Diphtheria titers - We will call you in 1-2 weeks with the results of your testing.  5. Return in about 3 months (around 05/10/2018).    Please inform us of any Emergency Department visits, hospitalizations, or changes in symptoms. Call us before going to the ED for breathing or allergy symptoms since we might be able to fit you in for a sick visit. Feel free to contact us anytime with any questions,  problems, or concerns.  It was a pleasure to see you again today!  Websites that have reliable patient information: 1. American Academy of Asthma, Allergy, and Immunology: www.aaaai.org 2. Food Allergy Research and Education (FARE): foodallergy.org 3. Mothers of Asthmatics: http://www.asthmacommunitynetwork.org 4. American College of Allergy, Asthma, and Immunology: MissingWeapons.cawww.acaai.org   Make sure you are registered to vote! If you have moved or changed any of your contact information, you will need to get this updated before voting!

## 2018-02-07 NOTE — Progress Notes (Addendum)
FOLLOW UP  Date of Service/Encounter:  02/07/18   Assessment:   Moderate persistent asthma without complication  Seasonal and perennial allergic rhinitis  Oral candidiasis  Recurrent infections    Ms. Lori West presents with continued problems from a respiratory perspective.  She is not completely brought into the fact that this is likely from uncontrolled asthma, as she feels that she needs to be wheezing for it to be asthma.  We did have a conversation that recurrent cough can also be a sign of uncontrolled asthma.  It is reassuring that the symptoms are responsive to Xopenex, which certainly points towards an asthma trigger.  She does have an isolated absolute eosinophil count above 600 from approximately 1 year ago, which would qualify her for 1 of our anti-IL-5 medications.  She likely also has an elevated IgE, and we will check this today with an environmental allergy panel.  An elevated IgE would qualify her for Xolair.  We did discuss how each of these medications work and we did provide handouts on their side effects.  This will allow her to make a more informed decision once we get the lab results back.  We are going to work-up her history of recurrent infections.  They are all within the realm of all being caused by uncontrolled atopy, and reassuringly she has never needed IV antibiotics or hospitalizations for her symptoms.  We may consider testing her immune system with vaccinations if there are any abnormalities noted.  Plan/Recommendations:   1. Moderate persistent asthma without complication - Lung testing looks normal today. - I think that you are continuing to have uncontrolled asthma, so an injectable medication could be helpful.  - Start Spiriva two puffs once daily in addition to your Symbicort. - Daily controller medication(s): Singulair 10mg  daily, Symbicort 160/4.485mcg two puffs twice daily with spacer and Spiriva 1.6125mcg two puffs once daily - Prior to physical  activity: Xopenex 2 puffs 10-15 minutes before physical activity. - Rescue medications: Xopenex 4 puffs every 4-6 hours as needed - Asthma control goals:  * Full participation in all desired activities (may need albuterol before activity) * Albuterol use two time or less a week on average (not counting use with activity) * Cough interfering with sleep two time or less a month * Oral steroids no more than once a year * No hospitalizations  2. Seasonal and perennial allergic rhinitis - Continue with azelastine two sprays per nostril up to twice daily. - Restart Flonase two sprays per nostril up to twice daily.  - We will recheck your allergies via your blood.   3. Oral candidiasis - Start the fluconazole that you have on hand.  - Continue with mouth washing.   4. Recurrent infections - We will obtain some screening labs to evaluate your immune system.  - Labs to evaluate the quantitative aspects of your immune system: IgG/IgA/IgM, CBC with differential - Labs to evaluate the qualitative aspects of your immune system: CH50, Pneumococcal titers, Tetanus titers, Diphtheria titers - We will call you in 1-2 weeks with the results of your testing.  5. Return in about 3 months (around 05/10/2018).   Subjective:   Lori West is a 22 y.o. female presenting today for follow up of  Chief Complaint  Patient presents with  . Asthma  . Sinusitis    since July 2019  . Cough    since July 2019    Lori West has a history of the following: Patient Active Problem  List   Diagnosis Date Noted  . Chronic migraine without aura without status migrainosus, not intractable 02/05/2018  . Seasonal allergic conjunctivitis 09/06/2017  . Moderate persistent asthma 06/21/2017  . Seasonal and perennial allergic rhinitis 06/21/2017  . Gastroesophageal reflux disease 06/21/2017  . Cyst of skin 04/05/2016  . History of keloid of skin 04/05/2016  . Mild persistent asthma 07/06/2015  . Chronic  rhinitis 07/06/2015  . Migraine with aura and without status migrainosus, not intractable 02/08/2015  . Morbid obesity (BMI 34+) 01/08/2015    History obtained from: chart review and patient.  Lori West's Primary Care Provider is Lucky CowboyMcKeown, William, MD.     Lori West is a 22 y.o. female presenting for a follow up visit.  She was last seen in March 2019.  At that time, she was diagnosed with an asthma exacerbation.  We continued her on Symbicort 160/4.5 mcg 2 puffs twice daily as well as Singulair and Xopenex.  We did give her a dose of Decadron.  For her allergies, she was continued on Allegra 180 mg once daily. We did start her on Xhance 2 sprays per nostril twice daily and encouraged the use of Mucinex.  Since the last visit, she has continued to have problems.  She did do well for 2 months after her last visit in March, but then developed mucus production, bronchitis, and coughing.  This is been ongoing since approximately May.  She has been on Decadron on 2 different occasions.  After 6 weeks, she was finally treated with an antibiotic (azithromycin).  She is always hesitant to use antibiotics and she has had C. difficile in the past secondary to antibiotic use.  Neither the steroid nor the antibiotic course have seem to improve her symptoms.  She has been using her Xopenex with improvement in her symptoms.  She is using her Xopenex 3-4 times per day.  She also remains on Mucinex, which she has been using intermittently.  Asthma/Respiratory Symptom History: She remains on Symbicort 160/4.5 mcg 2 puffs twice daily.  She is using a spacer.  She has been using her Xopenex, which typically she was only using prior to physical activity but is now using even without physical activity.  She has been on 2 courses of Decadron since the last visit.  ACT score today is 20, indicating excellent asthma control.  She has not required any ER visits, but has been to see her primary care provider on multiple  occasions.  Allergic Rhinitis Symptom History: She is on as a lasting 2 sprays per nostril up to twice daily.  She is using nasal saline rinses.  Despite prescribing Xhance at the last visit, evidently this was not covered by her insurance.  She is not having using Flonase at this time. She has been on allergy tested in the distant past when she was younger and followed by Dr. Lucie LeatherKozlow.  She has never been on allergen immunotherapy.  There are no new exposures, but she does have pets in the home, which she is not willing to part with.  She also tells me today that she tends to get infections very frequently.  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 She requires antibiotics around 2-3 times per year. She only has sinopulmonary infections.   Otherwise, there have been no changes to her past medical history, surgical history, family history, or social history.    Review  of Systems: a 14-point review of systems is pertinent for what is mentioned in HPI.  Otherwise, all other systems were negative. Constitutional: negative other than that listed in the HPI Eyes: negative other than that listed in the HPI Ears, nose, mouth, throat, and face: negative other than that listed in the HPI Respiratory: negative other than that listed in the HPI Cardiovascular: negative other than that listed in the HPI Gastrointestinal: negative other than that listed in the HPI Genitourinary: negative other than that listed in the HPI Integument: negative other than that listed in the HPI Hematologic: negative other than that listed in the HPI Musculoskeletal: negative other than that listed in the HPI Neurological: negative other than that listed in the HPI Allergy/Immunologic: negative other than that listed in the HPI    Objective:   Blood pressure 96/62, pulse 86, height 5\' 4"  (1.626 m), weight 201 lb 12.8 oz (91.5 kg), SpO2 98 %. Body  mass index is 34.64 kg/m.   Physical Exam:  General: Alert, interactive, in no acute distress.  Pleasant talkative female. Eyes: No conjunctival injection bilaterally, no discharge on the right, no discharge on the left and no Horner-Trantas dots present. PERRL bilaterally. EOMI without pain. No photophobia.  Ears: Right TM pearly gray with normal light reflex, Left TM pearly gray with normal light reflex, Right TM intact without perforation and Left TM intact without perforation.  Nose/Throat: External nose within normal limits and septum midline. Turbinates edematous and pale with clear discharge. Posterior oropharynx erythematous without cobblestoning in the posterior oropharynx. Tonsils 2+ without exudates.  Tongue without thrush. Lungs: Clear to auscultation without wheezing, rhonchi or rales. No increased work of breathing. CV: Normal S1/S2. No murmurs. Capillary refill <2 seconds.  Skin: Warm and dry, without lesions or rashes. Neuro:   Grossly intact. No focal deficits appreciated. Responsive to questions.  Diagnostic studies:   Spirometry: results normal (FEV1: 2.93/89%, FVC: 3.41/90%, FEV1/FVC: 86%).    Spirometry consistent with normal pattern.    Allergy Studies: none    Malachi Bonds, MD  [facility]

## 2018-02-07 NOTE — Telephone Encounter (Signed)
Received PA request for pt's aimovig from [employer]. KEY: [id number].  Sent to [facility]. Should have a determination in 3-5 business days.

## 2018-02-08 ENCOUNTER — Other Ambulatory Visit: Payer: Self-pay | Admitting: Allergy and Immunology

## 2018-02-08 DIAGNOSIS — J302 Other seasonal allergic rhinitis: Secondary | ICD-10-CM

## 2018-02-08 DIAGNOSIS — J3089 Other allergic rhinitis: Secondary | ICD-10-CM

## 2018-02-08 DIAGNOSIS — J4540 Moderate persistent asthma, uncomplicated: Secondary | ICD-10-CM

## 2018-02-08 NOTE — Telephone Encounter (Signed)
Judeth CornfieldStephanie @ [employer] called and informed us that they have updated the [facility] the approval. The Aimovig 140 mg pen is approved. There will not be another letter to follow. Called [employer] and informed Selena BattenKim of the approval for the 140 mg pen. She processed the claim while on the phone. It would not work at the time and still said PA however Selena BattenKim said she would continue to try to run it and will call back if it doesn't work. She is also aware that the pt should be bringing a copay card that will work regardless while this gets sorted out. Kim verbalized understanding.

## 2018-02-08 NOTE — Telephone Encounter (Signed)
Received approval for Aimovig 70 mg. Called [facility] and asked for a change in approval for the Aimovig 140 mg pen instead (per order). Spoke with [employer]. I was told that this shouldn't be a problem and they will work on this. Will be on the lookout for another approval letter.

## 2018-02-09 DIAGNOSIS — F411 Generalized anxiety disorder: Secondary | ICD-10-CM | POA: Diagnosis not present

## 2018-02-15 DIAGNOSIS — Z6835 Body mass index (BMI) 35.0-35.9, adult: Secondary | ICD-10-CM | POA: Diagnosis not present

## 2018-02-15 DIAGNOSIS — N907 Vulvar cyst: Secondary | ICD-10-CM | POA: Diagnosis not present

## 2018-02-15 NOTE — Progress Notes (Deleted)
   Assessment and Plan:    HPI 22 y.o.female presents for medication review. She is following with asthma/allergist and neurologist for her migraines. She is on xopenex, claritin, symbicort, and singulair. She is trying to start on the new pen aimoivig.   Past Medical History:  Diagnosis Date  . Anxiety   . Asthma   . C. difficile diarrhea   . Depression   . Gastritis   . Gastropathy    mild reactive  . Headache   . IBS (irritable bowel syndrome)   . Obesity   . Vitamin D deficiency      Allergies  Allergen Reactions  . Breo Ellipta [Fluticasone Furoate-Vilanterol] Other (See Comments)    Thrush  . Peanut-Containing Drug Products Other (See Comments)    Tingling and numbness in throat from tree nuts  . Prednisone Other (See Comments)    Elevated temperature  . Penicillins Rash    Augmentin  . Alcohol-Sulfur [Sulfur]   . Metronidazole     Other reaction(s): Other (See Comments) Seizures and high temp.  . Toradol [Ketorolac Tromethamine]     Syncope, nausea    Current Outpatient Medications on File Prior to Visit  Medication Sig  . budesonide-formoterol (SYMBICORT) 160-4.5 MCG/ACT inhaler Inhale 2 puffs into the lungs 2 (two) times daily.  Marland Kitchen. buPROPion (WELLBUTRIN XL) 150 MG 24 hr tablet Take 150 mg by mouth daily.  . Cholecalciferol (VITAMIN D3) 5000 units CAPS Take 1 capsule by mouth daily.  Dorise Hiss. Erenumab-aooe (AIMOVIG) 140 MG/ML SOAJ Inject 140 mg into the skin every 30 (thirty) days.  . fexofenadine (ALLEGRA) 180 MG tablet Take 1 tablet (180 mg total) by mouth daily.  Marland Kitchen. levalbuterol (XOPENEX HFA) 45 MCG/ACT inhaler Inhale 2 puffs into the lungs every 4 (four) hours as needed for wheezing.  Marland Kitchen. levonorgestrel (MIRENA) 20 MCG/24HR IUD 1 each by Intrauterine route once.  . loratadine (CLARITIN) 10 MG tablet Take 10 mg by mouth daily.  . montelukast (SINGULAIR) 10 MG tablet TAKE ONE TABLET BY MOUTH DAILY   No current facility-administered medications on file prior to  visit.     ROS: all negative except above.   Physical Exam: There were no vitals filed for this visit. There were no vitals taken for this visit. General Appearance: Well nourished, in no apparent distress. Eyes: PERRLA, EOMs, conjunctiva no swelling or erythema Sinuses: No Frontal/maxillary tenderness ENT/Mouth: Ext aud canals clear, TMs without erythema, bulging. No erythema, swelling, or exudate on post pharynx.  Tonsils not swollen or erythematous. Hearing normal.  Neck: Supple, thyroid normal.  Respiratory: Respiratory effort normal, BS equal bilaterally without rales, rhonchi, wheezing or stridor.  Cardio: RRR with no MRGs. Brisk peripheral pulses without edema.  Abdomen: Soft, + BS.  Non tender, no guarding, rebound, hernias, masses. Lymphatics: Non tender without lymphadenopathy.  Musculoskeletal: Full ROM, 5/5 strength, normal gait.  Skin: Warm, dry without rashes, lesions, ecchymosis.  Neuro: Cranial nerves intact. Normal muscle tone, no cerebellar symptoms. Sensation intact.  Psych: Awake and oriented X 3, normal affect, Insight and Judgment appropriate.     Quentin MullingAmanda Cotton Beckley, PA-C 1:08 PM [facility]

## 2018-02-21 ENCOUNTER — Ambulatory Visit: Payer: Self-pay | Admitting: Physician Assistant

## 2018-02-21 DIAGNOSIS — F411 Generalized anxiety disorder: Secondary | ICD-10-CM | POA: Diagnosis not present

## 2018-02-25 ENCOUNTER — Other Ambulatory Visit: Payer: Self-pay | Admitting: Family Medicine

## 2018-02-26 DIAGNOSIS — F411 Generalized anxiety disorder: Secondary | ICD-10-CM | POA: Diagnosis not present

## 2018-02-27 DIAGNOSIS — H52223 Regular astigmatism, bilateral: Secondary | ICD-10-CM | POA: Diagnosis not present

## 2018-02-28 ENCOUNTER — Ambulatory Visit (INDEPENDENT_AMBULATORY_CARE_PROVIDER_SITE_OTHER): Payer: BLUE CROSS/BLUE SHIELD | Admitting: Physician Assistant

## 2018-02-28 ENCOUNTER — Encounter: Payer: Self-pay | Admitting: Physician Assistant

## 2018-02-28 VITALS — BP 116/70 | HR 88 | Temp 98.2°F | Resp 12 | Ht 64.0 in | Wt 202.4 lb

## 2018-02-28 DIAGNOSIS — F3341 Major depressive disorder, recurrent, in partial remission: Secondary | ICD-10-CM

## 2018-02-28 DIAGNOSIS — J310 Chronic rhinitis: Secondary | ICD-10-CM

## 2018-02-28 MED ORDER — LEVOCETIRIZINE DIHYDROCHLORIDE 5 MG PO TABS: 5.0000 mg | ORAL_TABLET | Freq: Every evening | ORAL | 3 refills | Status: DC

## 2018-02-28 MED ORDER — VENLAFAXINE HCL ER 37.5 MG PO CP24: ORAL_CAPSULE | ORAL | 0 refills | Status: DC

## 2018-02-28 MED ORDER — FLUTICASONE PROPIONATE 93 MCG/ACT NA EXHU: 1.0000 | INHALANT_SUSPENSION | Freq: Once | NASAL | 3 refills | Status: DC

## 2018-02-28 NOTE — Progress Notes (Signed)
Subjective:    Patient ID: Lori West, female    DOB: 13-Jun-1996, 22 y.o.   MRN: [id number]  HPI 22 y.o. obese WF with history of migraines/depression. She is getting wellbutrin from psych in [place], and for past 3 weeks she has had an increase wellbutrin. She has had increase in headaches, she has dizziness with it, it was sedating her so she was taking it at night however for the last week she has only been sleeping for 4 hours. She states it has helped her depression but she feels that it could be causing hypomanic response.   Blood pressure 116/70, pulse 88, temperature 98.2 F (36.8 C), resp. rate 12, height 5\' 4"  (1.626 m), weight 202 lb 6.4 oz (91.8 kg), SpO2 98 %.  Medications Current Outpatient Medications on File Prior to Visit  Medication Sig  . buPROPion (WELLBUTRIN XL) 150 MG 24 hr tablet Take 150 mg by mouth daily.  . Cholecalciferol (VITAMIN D3) 5000 units CAPS Take 1 capsule by mouth daily.  . fexofenadine (ALLEGRA) 180 MG tablet Take 1 tablet (180 mg total) by mouth daily.  Marland Kitchen levalbuterol (XOPENEX HFA) 45 MCG/ACT inhaler Inhale 2 puffs into the lungs every 4 (four) hours as needed for wheezing.  Marland Kitchen levonorgestrel (MIRENA) 20 MCG/24HR IUD 1 each by Intrauterine route once.  . loratadine (CLARITIN) 10 MG tablet Take 10 mg by mouth daily.  . montelukast (SINGULAIR) 10 MG tablet TAKE ONE TABLET BY MOUTH DAILY  . SYMBICORT 160-4.5 MCG/ACT inhaler INHALE TWO PUFFS BY MOUTH TWICE A DAY   No current facility-administered medications on file prior to visit.     Problem list She has Morbid obesity (BMI 34+); Mild persistent asthma; Chronic rhinitis; Cyst of skin; History of keloid of skin; Moderate persistent asthma; Seasonal and perennial allergic rhinitis; Gastroesophageal reflux disease; Seasonal allergic conjunctivitis; Chronic migraine without aura without status migrainosus, not intractable; and Depression, major, recurrent, in partial remission (HCC) on their problem  list.    Review of Systems See HPI     Objective:   Physical Exam  Constitutional: She is oriented to person, place, and time. She appears well-developed and well-nourished.  HENT:  Head: Normocephalic and atraumatic.  Right Ear: External ear normal.  Left Ear: External ear normal.  Mouth/Throat: Oropharynx is clear and moist.  Eyes: Pupils are equal, round, and reactive to light. Conjunctivae and EOM are normal.  Neck: Normal range of motion. Neck supple. No thyromegaly present.  Cardiovascular: Normal rate, regular rhythm and normal heart sounds. Exam reveals no gallop and no friction rub.  No murmur heard. Pulmonary/Chest: Effort normal and breath sounds normal. No respiratory distress. She has no wheezes.  Abdominal: Soft. Bowel sounds are normal. She exhibits no distension and no mass. There is no tenderness. There is no rebound and no guarding.  Musculoskeletal: Normal range of motion.  Lymphadenopathy:    She has no cervical adenopathy.  Neurological: She is alert and oriented to person, place, and time. She displays normal reflexes. No cranial nerve deficit. Coordination normal.  Skin: Skin is warm and dry.  Psychiatric: She has a normal mood and affect.          Assessment & Plan:  Ilisha was seen today for other.  Diagnoses and all orders for this visit:  Chronic rhinitis -     Fluticasone Propionate (XHANCE) 93 MCG/ACT EXHU; Place 1 spray into the nose once for 1 dose. -     levocetirizine (XYZAL) 5 MG tablet; Take  1 tablet (5 mg total) by mouth every evening.  Depression, major, recurrent, in partial remission (HCC) -     venlafaxine XR (EFFEXOR XR) 37.5 MG 24 hr capsule; 1-2 pills a day Try the wellbutrin in the morning If this does not help we will switch you to effexor  Go from 300mg  to 150mg  wellbutrin (can cut in half) for one week WITH effexor 37.5mg  After 1 week stop the wellbutrin and go up to 2 pills of the effexor or 75mg  We can increase to  150mg  of the effexor after 2-4 weeks depending on how you are doing     Future Appointments  Date Time Provider Department Center  05/08/2018 10:45 AM Bobbitt, Heywood Iles, MD [facility] None  05/11/2018 10:30 AM Quentin Mulling, PA-C [facility] None

## 2018-02-28 NOTE — Patient Instructions (Addendum)
Try the wellbutrin in the morning If this does not help we will switch you to effexor  Go from 300mg  to 150mg  wellbutrin (can cut in half) for one week WITH effexor 37.5mg  After 1 week stop the wellbutrin and go up to 2 pills of the effexor or 75mg  We can increase to 150mg  of the effexor after 2-4 weeks depending on how you are doing   Try the [place], get coupon online for nasal allergies

## 2018-03-06 DIAGNOSIS — F411 Generalized anxiety disorder: Secondary | ICD-10-CM | POA: Diagnosis not present

## 2018-03-12 DIAGNOSIS — F411 Generalized anxiety disorder: Secondary | ICD-10-CM | POA: Diagnosis not present

## 2018-03-14 DIAGNOSIS — Z713 Dietary counseling and surveillance: Secondary | ICD-10-CM | POA: Diagnosis not present

## 2018-03-19 DIAGNOSIS — F411 Generalized anxiety disorder: Secondary | ICD-10-CM | POA: Diagnosis not present

## 2018-03-22 DIAGNOSIS — F411 Generalized anxiety disorder: Secondary | ICD-10-CM | POA: Diagnosis not present

## 2018-04-05 DIAGNOSIS — F411 Generalized anxiety disorder: Secondary | ICD-10-CM | POA: Diagnosis not present

## 2018-04-10 ENCOUNTER — Encounter: Payer: Self-pay | Admitting: Allergy and Immunology

## 2018-04-10 ENCOUNTER — Ambulatory Visit (INDEPENDENT_AMBULATORY_CARE_PROVIDER_SITE_OTHER): Payer: BLUE CROSS/BLUE SHIELD | Admitting: Allergy and Immunology

## 2018-04-10 VITALS — BP 126/72 | HR 92 | Resp 18

## 2018-04-10 DIAGNOSIS — J3089 Other allergic rhinitis: Secondary | ICD-10-CM | POA: Diagnosis not present

## 2018-04-10 DIAGNOSIS — J4540 Moderate persistent asthma, uncomplicated: Secondary | ICD-10-CM

## 2018-04-10 DIAGNOSIS — B999 Unspecified infectious disease: Secondary | ICD-10-CM | POA: Insufficient documentation

## 2018-04-10 DIAGNOSIS — J302 Other seasonal allergic rhinitis: Secondary | ICD-10-CM

## 2018-04-10 MED ORDER — TIOTROPIUM BROMIDE MONOHYDRATE 1.25 MCG/ACT IN AERS: 2.0000 | INHALATION_SPRAY | Freq: Every day | RESPIRATORY_TRACT | 5 refills | Status: DC

## 2018-04-10 MED ORDER — CARBINOXAMINE MALEATE 4 MG PO TABS: 4.0000 mg | ORAL_TABLET | Freq: Four times a day (QID) | ORAL | 5 refills | Status: DC | PRN

## 2018-04-10 NOTE — Assessment & Plan Note (Addendum)
   For now, continue Symbicort 160-4.5 g, 2 inhalations via spacer device twice daily, montelukast 10 mg daily at bedtime, and Xopenex HFA, 1-2 inhalations every 6 hours as needed and 15 minutes prior to exercise.  A prescription has been provided for Spiriva Respimat 1.25 g, 2 inhalations daily.  If symptoms persist or progress despite treatment plan as outlined above, we will consider a biologic agent.  Labs will be drawn to assess candidacy for biologic agents.  Subjective and objective measures of pulmonary function will be followed and the treatment plan will be adjusted accordingly.

## 2018-04-10 NOTE — Patient Instructions (Addendum)
Moderate persistent asthma  For now, continue Symbicort 160-4.5 g, 2 inhalations via spacer device twice daily, montelukast 10 mg daily at bedtime, and Xopenex HFA, 1-2 inhalations every 6 hours as needed and 15 minutes prior to exercise.  A prescription has been provided for Spiriva Respimat 1.25 g, 2 inhalations daily.  If symptoms persist or progress despite treatment plan as outlined above, we will consider a biologic agent.  Labs will be drawn to assess candidacy for biologic agents.  Subjective and objective measures of pulmonary function will be followed and the treatment plan will be adjusted accordingly.  Seasonal and perennial allergic rhinitis  Continue appropriate allergen avoidance measures, montelukast 10 mg daily bedtime, and fluticasone nasal spray as needed.  A prescription has been provided for carbinoxamine 4 mg every 6-8 hours if needed.  Discontinue levocetirizine (Xyzal).  Nasal saline spray (i.e., Simply Saline) or nasal saline lavage (i.e., NeilMed) is recommended as needed and prior to medicated nasal sprays.  Consider rechecking aeroallergen skin testing to assess candidacy for immunotherapy injections.  Recurrent infections Immunocompetence will be assessed with labs.  Screening labs have been ordered.  The patient will be called with further recommendations and follow-up instructions once the labs have returned.   Return in about 3 months (around 07/11/2018), or if symptoms worsen or fail to improve.

## 2018-04-10 NOTE — Assessment & Plan Note (Addendum)
   Continue appropriate allergen avoidance measures, montelukast 10 mg daily bedtime, and fluticasone nasal spray as needed.  A prescription has been provided for carbinoxamine 4 mg every 6-8 hours if needed.  Discontinue levocetirizine (Xyzal).  Nasal saline spray (i.e., Simply Saline) or nasal saline lavage (i.e., NeilMed) is recommended as needed and prior to medicated nasal sprays.  Consider rechecking aeroallergen skin testing to assess candidacy for immunotherapy injections.

## 2018-04-10 NOTE — Assessment & Plan Note (Signed)
Immunocompetence will be assessed with labs.  Screening labs have been ordered.  The patient will be called with further recommendations and follow-up instructions once the labs have returned.

## 2018-04-10 NOTE — Progress Notes (Signed)
Follow-up Note  RE: Lori West MRN: [id number] DOB: 07/19/1995 Date of Office Visit: 04/10/2018  Primary care provider: Lucky Cowboy, MD Referring provider: Lucky Cowboy, MD  History of present illness: Lori West is a 22 y.o. female with persistent asthma and allergic rhinitis presenting today for follow-up.  She was last seen in this clinic on February 07, 2018.  She continues to experience frequent chest tightness despite compliance with Symbicort 160-4.5 g, 2 elations via spacer device twice daily, and montelukast 10 mg daily at bedtime.  She admits to tolerating symptoms without using Xopenex, however notes that when she does use Xopenex she experiences rapid relief.  She takes Xopenex prior to exercise.  She believes that her asthma is primarily triggered by pollen exposure, pet exposure, exercise, and respiratory tract infections.  She also reports the sensation of thick mucus in her chest which has been a problem for several months.  She attempts to avoid children for fear that she will develop a lingering respiratory tract infection.  Immunodeficiency labs have been previously ordered, however she did not have these labs drawn for unclear reasons. Takya has experienced nasal congestion and thick postnasal drainage, particularly this summer and fall despite compliance with montelukast and levocetirizine daily.  Assessment and plan: Moderate persistent asthma  For now, continue Symbicort 160-4.5 g, 2 inhalations via spacer device twice daily, montelukast 10 mg daily at bedtime, and Xopenex HFA, 1-2 inhalations every 6 hours as needed and 15 minutes prior to exercise.  A prescription has been provided for Spiriva Respimat 1.25 g, 2 inhalations daily.  If symptoms persist or progress despite treatment plan as outlined above, we will consider a biologic agent.  Labs will be drawn to assess candidacy for biologic agents.  Subjective and objective measures of  pulmonary function will be followed and the treatment plan will be adjusted accordingly.  Seasonal and perennial allergic rhinitis  Continue appropriate allergen avoidance measures, montelukast 10 mg daily bedtime, and fluticasone nasal spray as needed.  A prescription has been provided for carbinoxamine 4 mg every 6-8 hours if needed.  Discontinue levocetirizine (Xyzal).  Nasal saline spray (i.e., Simply Saline) or nasal saline lavage (i.e., NeilMed) is recommended as needed and prior to medicated nasal sprays.  Consider rechecking aeroallergen skin testing to assess candidacy for immunotherapy injections.  Recurrent infections Immunocompetence will be assessed with labs.  Screening labs have been ordered.  The patient will be called with further recommendations and follow-up instructions once the labs have returned.   Meds ordered this encounter  Medications  . Tiotropium Bromide Monohydrate (SPIRIVA RESPIMAT) 1.25 MCG/ACT AERS    Sig: Inhale 2 puffs into the lungs daily.    Dispense:  1 Inhaler    Refill:  5  . Carbinoxamine Maleate 4 MG TABS    Sig: Take 1 tablet (4 mg total) by mouth every 6 (six) hours as needed.    Dispense:  90 each    Refill:  5    Diagnostics: Prematurity reveals an FVC of 3.13 L and an FEV1 of 2.62 L (79% predicted) without significant postbronchodilator improvement.  This study was performed while the patient was asymptomatic.  Please see scanned spirometry results for details.    Physical examination: Blood pressure 126/72, pulse 92, resp. rate 18, SpO2 97 %.  General: Alert, interactive, in no acute distress. HEENT: TMs pearly gray, turbinates moderately edematous with thick discharge, post-pharynx moderately erythematous. Neck: Supple without lymphadenopathy. Lungs: Clear to auscultation without wheezing, rhonchi  or rales. CV: Normal S1, S2 without murmurs. Skin: Warm and dry, without lesions or rashes.  The following portions of the  patient's history were reviewed and updated as appropriate: allergies, current medications, past family history, past medical history, past social history, past surgical history and problem list.  Allergies as of 04/10/2018      Reactions   Breo Ellipta [fluticasone Furoate-vilanterol] Other (See Comments)   Thrush   Peanut-containing Drug Products Other (See Comments)   Tingling and numbness in throat from tree nuts   Prednisone Other (See Comments)   Elevated temperature   Penicillins Rash   Augmentin   Alcohol-sulfur [sulfur]    Metronidazole    Other reaction(s): Other (See Comments) Seizures and high temp.   Toradol [ketorolac Tromethamine]    Syncope, nausea      Medication List        Accurate as of 04/10/18 12:32 PM. Always use your most recent med list.          azelastine 0.1 % nasal spray Commonly known as:  ASTELIN Place 2 sprays into both nostrils 2 (two) times daily. Use in each nostril as directed   buPROPion 150 MG 24 hr tablet Commonly known as:  WELLBUTRIN XL Take 150 mg by mouth daily.   Carbinoxamine Maleate 4 MG Tabs Take 1 tablet (4 mg total) by mouth every 6 (six) hours as needed.   fluticasone 50 MCG/ACT nasal spray Commonly known as:  FLONASE Place 2 sprays into both nostrils daily.   levalbuterol 45 MCG/ACT inhaler Commonly known as:  XOPENEX HFA Inhale 2 puffs into the lungs every 4 (four) hours as needed for wheezing.   levocetirizine 5 MG tablet Commonly known as:  XYZAL Take 1 tablet (5 mg total) by mouth every evening.   levonorgestrel 20 MCG/24HR IUD Commonly known as:  MIRENA 1 each by Intrauterine route once.   montelukast 10 MG tablet Commonly known as:  SINGULAIR TAKE ONE TABLET BY MOUTH DAILY   SYMBICORT 160-4.5 MCG/ACT inhaler Generic drug:  budesonide-formoterol INHALE TWO PUFFS BY MOUTH TWICE A DAY   Tiotropium Bromide Monohydrate 1.25 MCG/ACT Aers Inhale 2 puffs into the lungs daily.   Vitamin D3 5000 units  Caps Take 1 capsule by mouth daily.       Allergies  Allergen Reactions  . Breo Ellipta [Fluticasone Furoate-Vilanterol] Other (See Comments)    Thrush  . Peanut-Containing Drug Products Other (See Comments)    Tingling and numbness in throat from tree nuts  . Prednisone Other (See Comments)    Elevated temperature  . Penicillins Rash    Augmentin  . Alcohol-Sulfur [Sulfur]   . Metronidazole     Other reaction(s): Other (See Comments) Seizures and high temp.  . Toradol [Ketorolac Tromethamine]     Syncope, nausea   Review of systems: Review of systems negative except as noted in HPI / PMHx or noted below: Constitutional: Negative.  HENT: Negative.   Eyes: Negative.  Respiratory: Negative.   Cardiovascular: Negative.  Gastrointestinal: Negative.  Genitourinary: Negative.  Musculoskeletal: Negative.  Neurological: Negative.  Endo/Heme/Allergies: Negative.  Cutaneous: Negative.  Past Medical History:  Diagnosis Date  . Anxiety   . Asthma   . C. difficile diarrhea   . Depression   . Gastritis   . Gastropathy    mild reactive  . Headache   . IBS (irritable bowel syndrome)   . Obesity   . Vitamin D deficiency     Family History  Problem Relation Age  of Onset  . Heart disease Maternal Aunt   . Parkinsonism Maternal Grandfather   . Migraines Mother   . Allergic rhinitis Mother   . Allergic rhinitis Father   . Skin cancer Maternal Aunt   . Diabetes Maternal Grandmother   . Breast cancer Maternal Uncle   . Breast cancer Paternal Aunt   . Angioedema Neg Hx   . Asthma Neg Hx   . Immunodeficiency Neg Hx   . Eczema Neg Hx   . Urticaria Neg Hx     Social History   Socioeconomic History  . Marital status: Single    Spouse name: Not on file  . Number of children: 0  . Years of education: Not on file  . Highest education level: Bachelor's degree (e.g., BA, AB, BS)  Occupational History  . Not on file  Social Needs  . Financial resource strain: Not on  file  . Food insecurity:    Worry: Not on file    Inability: Not on file  . Transportation needs:    Medical: Not on file    Non-medical: Not on file  Tobacco Use  . Smoking status: Never Smoker  . Smokeless tobacco: Never Used  Substance and Sexual Activity  . Alcohol use: No  . Drug use: No  . Sexual activity: Never    Birth control/protection: IUD  Lifestyle  . Physical activity:    Days per week: Not on file    Minutes per session: Not on file  . Stress: Not on file  Relationships  . Social connections:    Talks on phone: Not on file    Gets together: Not on file    Attends religious service: Not on file    Active member of club or organization: Not on file    Attends meetings of clubs or organizations: Not on file    Relationship status: Not on file  . Intimate partner violence:    Fear of current or ex partner: Not on file    Emotionally abused: Not on file    Physically abused: Not on file    Forced sexual activity: Not on file  Other Topics Concern  . 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 Please do not hesitate to contact me with questions.  Sincerely,   R. Jorene Guest, MD

## 2018-04-12 DIAGNOSIS — F411 Generalized anxiety disorder: Secondary | ICD-10-CM | POA: Diagnosis not present

## 2018-04-19 DIAGNOSIS — F411 Generalized anxiety disorder: Secondary | ICD-10-CM | POA: Diagnosis not present

## 2018-04-23 DIAGNOSIS — F411 Generalized anxiety disorder: Secondary | ICD-10-CM | POA: Diagnosis not present

## 2018-04-30 DIAGNOSIS — F411 Generalized anxiety disorder: Secondary | ICD-10-CM | POA: Diagnosis not present

## 2018-05-07 DIAGNOSIS — T50995A Adverse effect of other drugs, medicaments and biological substances, initial encounter: Secondary | ICD-10-CM | POA: Diagnosis not present

## 2018-05-07 DIAGNOSIS — T50905A Adverse effect of unspecified drugs, medicaments and biological substances, initial encounter: Secondary | ICD-10-CM | POA: Diagnosis not present

## 2018-05-07 DIAGNOSIS — R42 Dizziness and giddiness: Secondary | ICD-10-CM | POA: Diagnosis not present

## 2018-05-08 ENCOUNTER — Ambulatory Visit: Payer: BLUE CROSS/BLUE SHIELD | Admitting: Allergy and Immunology

## 2018-05-09 DIAGNOSIS — F411 Generalized anxiety disorder: Secondary | ICD-10-CM | POA: Diagnosis not present

## 2018-05-11 ENCOUNTER — Encounter: Payer: Self-pay | Admitting: Physician Assistant

## 2018-05-22 DIAGNOSIS — F331 Major depressive disorder, recurrent, moderate: Secondary | ICD-10-CM | POA: Diagnosis not present

## 2018-05-28 DIAGNOSIS — Z30430 Encounter for insertion of intrauterine contraceptive device: Secondary | ICD-10-CM | POA: Diagnosis not present

## 2018-05-28 DIAGNOSIS — Z880 Allergy status to penicillin: Secondary | ICD-10-CM | POA: Diagnosis not present

## 2018-05-28 DIAGNOSIS — J45909 Unspecified asthma, uncomplicated: Secondary | ICD-10-CM | POA: Diagnosis not present

## 2018-05-28 DIAGNOSIS — Z881 Allergy status to other antibiotic agents status: Secondary | ICD-10-CM | POA: Diagnosis not present

## 2018-05-28 DIAGNOSIS — N939 Abnormal uterine and vaginal bleeding, unspecified: Secondary | ICD-10-CM | POA: Diagnosis not present

## 2018-05-28 DIAGNOSIS — Z30431 Encounter for routine checking of intrauterine contraceptive device: Secondary | ICD-10-CM | POA: Diagnosis not present

## 2018-05-29 DIAGNOSIS — T8332XA Displacement of intrauterine contraceptive device, initial encounter: Secondary | ICD-10-CM | POA: Diagnosis not present

## 2018-05-29 DIAGNOSIS — N898 Other specified noninflammatory disorders of vagina: Secondary | ICD-10-CM | POA: Diagnosis not present

## 2018-05-29 DIAGNOSIS — F4310 Post-traumatic stress disorder, unspecified: Secondary | ICD-10-CM | POA: Diagnosis not present

## 2018-05-29 DIAGNOSIS — Z30430 Encounter for insertion of intrauterine contraceptive device: Secondary | ICD-10-CM | POA: Diagnosis not present

## 2018-06-01 DIAGNOSIS — R109 Unspecified abdominal pain: Secondary | ICD-10-CM | POA: Diagnosis not present

## 2018-06-14 ENCOUNTER — Other Ambulatory Visit: Payer: Self-pay | Admitting: Allergy and Immunology

## 2018-06-14 DIAGNOSIS — J4540 Moderate persistent asthma, uncomplicated: Secondary | ICD-10-CM

## 2018-06-14 DIAGNOSIS — J3089 Other allergic rhinitis: Secondary | ICD-10-CM

## 2018-06-14 DIAGNOSIS — J302 Other seasonal allergic rhinitis: Secondary | ICD-10-CM

## 2018-06-18 DIAGNOSIS — F411 Generalized anxiety disorder: Secondary | ICD-10-CM | POA: Diagnosis not present

## 2018-06-21 ENCOUNTER — Encounter: Payer: Self-pay | Admitting: Physician Assistant

## 2018-06-22 ENCOUNTER — Other Ambulatory Visit: Payer: Self-pay | Admitting: Physician Assistant

## 2018-06-22 ENCOUNTER — Other Ambulatory Visit: Payer: Self-pay | Admitting: Family Medicine

## 2018-06-22 DIAGNOSIS — J310 Chronic rhinitis: Secondary | ICD-10-CM

## 2018-06-25 ENCOUNTER — Telehealth: Payer: Self-pay | Admitting: *Deleted

## 2018-06-25 NOTE — Telephone Encounter (Signed)
Insurance will no cover Symbicort. PA submitted via covermymeds and waiting on response.

## 2018-06-25 NOTE — Progress Notes (Deleted)
Complete Physical  Assessment and Plan: Health Maintenance- Discussed STD testing, safe sex, alcohol and drug awareness, drinking and driving dangers, wearing a seat belt and general safety measures for young adult. . Discussed med's effects and SE's. Screening labs and tests as requested with regular follow-up as recommended. Over 40 minutes of exam, counseling, chart review and critical decision making was performed  HPI  This very nice 23 y.o.female presents for complete physical.  Patient has no major health issues.  Patient reports no complaints at this time.  She {DOES_DOES IFO:27741} workout.   BMI is There is no height or weight on file to calculate BMI., she is working on diet and exercise. Wt Readings from Last 3 Encounters:  02/28/18 202 lb 6.4 oz (91.8 kg)  02/07/18 201 lb 12.8 oz (91.5 kg)  02/05/18 201 lb (91.2 kg)    Finally, patient has history of Vitamin D Deficiency and last vitamin D was  Lab Results  Component Value Date   VD25OH 34 04/24/2015    Current Medications:  Current Outpatient Medications on File Prior to Visit  Medication Sig Dispense Refill  . azelastine (ASTELIN) 0.1 % nasal spray Place 2 sprays into both nostrils 2 (two) times daily. Use in each nostril as directed    . buPROPion (WELLBUTRIN XL) 150 MG 24 hr tablet Take 150 mg by mouth daily.    . Carbinoxamine Maleate 4 MG TABS Take 1 tablet (4 mg total) by mouth every 6 (six) hours as needed. 90 each 5  . Cholecalciferol (VITAMIN D3) 5000 units CAPS Take 1 capsule by mouth daily.    . fluticasone (FLONASE) 50 MCG/ACT nasal spray Place 2 sprays into both nostrils daily.    Marland Kitchen levalbuterol (XOPENEX HFA) 45 MCG/ACT inhaler Inhale 2 puffs into the lungs every 4 (four) hours as needed for wheezing. 1 Inhaler 3  . levocetirizine (XYZAL) 5 MG tablet Take 1 tablet every night for Allergies 90 tablet 3  . levonorgestrel (MIRENA) 20 MCG/24HR IUD 1 each by Intrauterine route once.    . montelukast  (SINGULAIR) 10 MG tablet TAKE ONE TABLET BY MOUTH DAILY 90 tablet 0  . SYMBICORT 160-4.5 MCG/ACT inhaler INHALE TWO PUFFS BY MOUTH TWICE A DAY 1 Inhaler 2  . Tiotropium Bromide Monohydrate (SPIRIVA RESPIMAT) 1.25 MCG/ACT AERS Inhale 2 puffs into the lungs daily. 1 Inhaler 5   No current facility-administered medications on file prior to visit.    Health Maintenance:   Immunization History  Administered Date(s) Administered  . DTaP 07/05/1994, 09/12/1994, 11/10/1994, 05/15/1995  . Hepatitis A 08/10/2005, 06/27/2006  . Hepatitis B 23-Feb-1994, 07/05/1994, 11/10/1994  . IPV 07/05/1994, 09/12/1994, 11/10/1994, 04/28/1998  . MMR 05/15/1995, 04/26/1999  . Meningococcal Conjugate 07/06/2015  . Tdap 06/17/2008  . Varicella 05/15/1995, 08/10/2005   TD/TDAP: 2009 DUE Influenza: Pneumovax:  Prevnar 13:  HPV vaccines: Discuss  LMP: No LMP recorded. (Menstrual status: IUD). Sexually Active: {YES NO:22349} STD testing offered Pap: MGM: :  Allergies:  Allergies  Allergen Reactions  . Breo Ellipta [Fluticasone Furoate-Vilanterol] Other (See Comments)    Thrush  . Peanut-Containing Drug Products Other (See Comments)    Tingling and numbness in throat from tree nuts  . Prednisone Other (See Comments)    Elevated temperature  . Penicillins Rash    Augmentin  . Alcohol-Sulfur [Sulfur]   . Metronidazole     Other reaction(s): Other (See Comments) Seizures and high temp.  . Toradol [Ketorolac Tromethamine]     Syncope, nausea   Medical History:  has Morbid obesity (BMI 34+); Cyst of skin; History of keloid of skin; Moderate persistent asthma; Seasonal and perennial allergic rhinitis; Gastroesophageal reflux disease; Seasonal allergic conjunctivitis; Chronic migraine without aura without status migrainosus, not intractable; Depression, major, recurrent, in partial remission ([place]); and Recurrent infections on their problem list. Surgical History:  She  has a past surgical history that  includes Intrauterine device insertion; Adenoidectomy; Adenoidectomy; and Breast lumpectomy (Left, 04/2016). Family History:  Her family history includes Allergic rhinitis in her father and mother; Breast cancer in her maternal uncle and paternal aunt; Diabetes in her maternal grandmother; Heart disease in her maternal aunt; Migraines in her mother; Parkinsonism in her maternal grandfather; Skin cancer in her maternal aunt. Social History:   reports that she has never smoked. She has never used smokeless tobacco. She reports that she does not drink alcohol or use drugs.  Review of Systems: ROS  Physical Exam: Estimated body mass index is 34.74 kg/m as calculated from the following:   Height as of 02/28/18: '5\' 4"'  (1.626 m).   Weight as of 02/28/18: 202 lb 6.4 oz (91.8 kg). There were no vitals taken for this visit. General Appearance: Well nourished, in no apparent distress.  Eyes: PERRLA, EOMs, conjunctiva no swelling or erythema, normal fundi and vessels.  Sinuses: No Frontal/maxillary tenderness  ENT/Mouth: Ext aud canals clear, normal light reflex with TMs without erythema, bulging. Good dentition. No erythema, swelling, or exudate on post pharynx. Tonsils not swollen or erythematous. Hearing normal.  Neck: Supple, thyroid normal. No bruits  Respiratory: Respiratory effort normal, BS equal bilaterally without rales, rhonchi, wheezing or stridor.  Cardio: RRR without murmurs, rubs or gallops. Brisk peripheral pulses without edema.  Chest: symmetric, with normal excursions and percussion.  Breasts: Symmetric, without lumps, nipple discharge, retractions.  Abdomen: Soft, nontender, no guarding, rebound, hernias, masses, or organomegaly.  Lymphatics: Non tender without lymphadenopathy.  Genitourinary:  Musculoskeletal: Full ROM all peripheral extremities,5/5 strength, and normal gait.  Skin: Warm, dry without rashes, lesions, ecchymosis. Neuro: Cranial nerves intact, reflexes equal  bilaterally. Normal muscle tone, no cerebellar symptoms. Sensation intact.  Psych: Awake and oriented X 3, normal affect, Insight and Judgment appropriate.   EKG: defer  Vicie Mutters 7:31 AM [facility]

## 2018-06-26 ENCOUNTER — Encounter: Payer: Self-pay | Admitting: Physician Assistant

## 2018-06-26 NOTE — Telephone Encounter (Signed)
[employer] faxed a request for further information regarding the PA for Symbicort. I went in and resubmitted with addendum to clinical information.

## 2018-06-27 NOTE — Telephone Encounter (Signed)
Another form from [employer] will be sent over for additional information for why patient is needing Symbicort. Paperwork will be faxed to the office we must provide office notes, and trial and fail of two medications in the same class.

## 2018-06-28 ENCOUNTER — Telehealth: Payer: Self-pay | Admitting: *Deleted

## 2018-06-28 NOTE — Telephone Encounter (Signed)
Per Dr Oneta Rack, a message was left stating she should buy OTC Zyrtec.  No prior authorization was done.

## 2018-07-02 NOTE — Telephone Encounter (Signed)
Yes. I received this packet on Friday. I cant find anything in the chart about allergy to inactive ingredients or trial of other medications.

## 2018-07-02 NOTE — Telephone Encounter (Signed)
Lori Hole, do you recall seeing anything on this Friday?

## 2018-07-02 NOTE — Telephone Encounter (Signed)
Received fax from [employer] Symbicort 160 was denied. Please advise what to switch to

## 2018-07-02 NOTE — Telephone Encounter (Signed)
Can you please call this patient and ask her how often she is using Symbicort and how its working for her. Also please find out what other inhalers she has tried and what were her reactions to those (especially the ones that didn't work or that she had a reaction to). Thank you.

## 2018-07-03 NOTE — Telephone Encounter (Signed)
Pt called back and she stated that she just picked up her Symbicort at the pharmacy with no problem. The Symbicort is working well for her and she takes it 2 puffs twice a day and also does Spiriva 2 puffs at night and leva albuterol 2 puffs before exercise and as needed.

## 2018-07-04 ENCOUNTER — Telehealth: Payer: Self-pay

## 2018-07-04 NOTE — Telephone Encounter (Signed)
PA initiated through [email or web address].   (Key: [id number]) - [id number]. Contacted [employer] regarding Symbicort and PA initiated through [employer]. [employer] do not participate with covermymeds and need to do all PA to [email or web address] PA initiated and submitted through their website.

## 2018-07-04 NOTE — Telephone Encounter (Signed)
-----   Message from Quentin Mulling, [place] sent at 07/03/2018  4:20 PM EST ----- Regarding: RE: QUESTION Contact: [phone] Yes continue these medications for now.  Marchelle Folks ----- Message ----- From: Gregery Na, CMA Sent: 07/03/2018  11:15 AM EST To: Quentin Mulling, PA-C Subject: QUESTION                                       Iron & zinc taking now  Should she be taking these if her bld count was ok in July?

## 2018-07-04 NOTE — Telephone Encounter (Signed)
Pt is aware to cont. IRON & ZINC Pt agreed.

## 2018-07-04 NOTE — Telephone Encounter (Signed)
Thank you :)

## 2018-07-31 ENCOUNTER — Telehealth: Payer: Self-pay

## 2018-07-31 ENCOUNTER — Other Ambulatory Visit: Payer: Self-pay | Admitting: Allergy and Immunology

## 2018-07-31 DIAGNOSIS — J4540 Moderate persistent asthma, uncomplicated: Secondary | ICD-10-CM

## 2018-07-31 NOTE — Telephone Encounter (Signed)
Pharmacy sent a RX request and refills have been sent it.

## 2018-07-31 NOTE — Telephone Encounter (Signed)
Patient is calling to get a new refill on Xopenex sent it. She called th pharmacy and she didn't have a new script on file.   [facility]

## 2018-08-25 ENCOUNTER — Other Ambulatory Visit: Payer: Self-pay | Admitting: Internal Medicine

## 2018-08-25 MED ORDER — FLUCONAZOLE 150 MG PO TABS: ORAL_TABLET | ORAL | 1 refills | Status: DC

## 2018-09-05 NOTE — Progress Notes (Signed)
Complete Physical  Assessment and Plan: Encounter for general adult medical examination with abnormal findings 1 year  Medication management -     CBC with Differential/Platelet -     COMPLETE METABOLIC PANEL WITH GFR -     Magnesium  Vitamin D deficiency -     VITAMIN D 25 Hydroxy (Vit-D Deficiency, Fractures)  Screening cholesterol level -     Lipid panel  Morbid obesity  - follow up 3 months for progress monitoring - increase veggies, decrease carbs - long discussion about weight loss, diet, and exercise  Anemia, unspecified type Check CBC  Depression, major, recurrent, in partial remission (HCC) -     TSH  Chronic migraine without aura without status migrainosus, not intractable Controlled , avoid triggers.  Moderate persistent asthma, unspecified whether complicated -     Fluticasone-Umeclidin-Vilant (TRELEGY ELLIPTA) 100-62.5-25 MCG/INH AEPB; Inhale 100 mcg into the lungs daily.  Seasonal and perennial allergic rhinitis -     Fluticasone Propionate (XHANCE) 93 MCG/ACT EXHU; Place 2 Act into the nose at bedtime.  Gastroesophageal reflux disease, esophagitis presence not specified Continue PPI/H2 blocker, diet discussed  Screening for hematuria or proteinuria -     Urinalysis, Routine w reflex microscopic -     Microalbumin / creatinine urine ratio   Discussed med's effects and SE's. Screening labs and tests as requested with regular follow-up as recommended. Over 40 minutes of exam, counseling, chart review, and complex, high level critical decision making was performed this visit.   HPI  23 y.o. female  presents for a complete physical and follow up for has Morbid obesity (BMI 34+); Cyst of skin; History of keloid of skin; Moderate persistent asthma; Seasonal and perennial allergic rhinitis; Gastroesophageal reflux disease; Seasonal allergic conjunctivitis; Chronic migraine without aura without status migrainosus, not intractable; Depression, major, recurrent,  in partial remission ([place]); and Recurrent infections on their problem list..  Her blood pressure has been controlled at home, today their BP is BP: 128/88 She does workout. She denies chest pain, shortness of breath, dizziness.   She has history of allergies, has small cough now, she is on singulair, xyzal, flonase, saline spray, xopenex, symbicort and spiriva.   BMI is Body mass index is 34.97 kg/m., she is working on diet and exercise. Wt Readings from Last 3 Encounters:  09/06/18 197 lb 6.4 oz (89.5 kg)  02/28/18 202 lb 6.4 oz (91.8 kg)  02/07/18 201 lb 12.8 oz (91.5 kg)   She is not on cholesterol medication and denies myalgias. Her cholesterol is not   at goal. The cholesterol last visit was:   Lab Results  Component Value Date   CHOL 131 09/23/2016   HDL 54 09/23/2016   LDLCALC 64 09/23/2016   TRIG 66 09/23/2016   CHOLHDL 2.4 09/23/2016    Last A1C in the office was:  Lab Results  Component Value Date   HGBA1C 4.4 09/23/2016   Last GFR: Lab Results  Component Value Date   GFRNONAA 123 12/19/2017   Patient is on Vitamin D supplement.   Lab Results  Component Value Date   VD25OH 34 04/24/2015      Current Medications:  Current Outpatient Medications on File Prior to Visit  Medication Sig Dispense Refill  . azelastine (ASTELIN) 0.1 % nasal spray Place 2 sprays into both nostrils 2 (two) times daily. Use in each nostril as directed    . buPROPion (WELLBUTRIN XL) 150 MG 24 hr tablet Take 150 mg by mouth daily.    Marland Kitchen  Carbinoxamine Maleate 4 MG TABS Take 1 tablet (4 mg total) by mouth every 6 (six) hours as needed. 90 each 5  . Cholecalciferol (VITAMIN D3) 5000 units CAPS Take 1 capsule by mouth daily.    . fluconazole (DIFLUCAN) 150 MG tablet Take 1 tablet  2 x /week  if needed for yeast infection 8 tablet 1  . fluticasone (FLONASE) 50 MCG/ACT nasal spray Place 2 sprays into both nostrils daily.    Marland Kitchen levalbuterol (XOPENEX HFA) 45 MCG/ACT inhaler INHALE 2 PUFFS INTO  LUNGS EVERY 4 HOURS AS NEEDED FOR WHEEZING 15 g 2  . levocetirizine (XYZAL) 5 MG tablet Take 1 tablet every night for Allergies 90 tablet 3  . levonorgestrel (MIRENA) 20 MCG/24HR IUD 1 each by Intrauterine route once.    . montelukast (SINGULAIR) 10 MG tablet TAKE ONE TABLET BY MOUTH DAILY 90 tablet 0  . SYMBICORT 160-4.5 MCG/ACT inhaler INHALE TWO PUFFS BY MOUTH TWICE A DAY 1 Inhaler 1  . Tiotropium Bromide Monohydrate (SPIRIVA RESPIMAT) 1.25 MCG/ACT AERS Inhale 2 puffs into the lungs daily. 1 Inhaler 5   No current facility-administered medications on file prior to visit.    Allergies:  Allergies  Allergen Reactions  . Breo Ellipta [Fluticasone Furoate-Vilanterol] Other (See Comments)    Thrush  . Peanut-Containing Drug Products Other (See Comments)    Tingling and numbness in throat from tree nuts  . Prednisone Other (See Comments)    Elevated temperature  . Penicillins Rash    Augmentin  . Alcohol-Sulfur [Sulfur]   . Metronidazole     Other reaction(s): Other (See Comments) Seizures and high temp.  . Toradol [Ketorolac Tromethamine]     Syncope, nausea   Medical History:  She has Morbid obesity (BMI 34+); Cyst of skin; History of keloid of skin; Moderate persistent asthma; Seasonal and perennial allergic rhinitis; Gastroesophageal reflux disease; Seasonal allergic conjunctivitis; Chronic migraine without aura without status migrainosus, not intractable; Depression, major, recurrent, in partial remission ([place]); and Recurrent infections on their problem list.    Health Maintenance:   Immunization History  Administered Date(s) Administered  . DTaP 07/05/1994, 09/12/1994, 11/10/1994, 05/15/1995  . Hepatitis A 08/10/2005, 06/27/2006  . Hepatitis B 1994/04/27, 07/05/1994, 11/10/1994  . IPV 07/05/1994, 09/12/1994, 11/10/1994, 04/28/1998  . MMR 05/15/1995, 04/26/1999  . Meningococcal Conjugate 07/06/2015  . Tdap 06/17/2008  . Varicella 05/15/1995, 08/10/2005    Tetanus:  2009 Pneumovax: Prevnar 13:  Flu vaccine: get the flu shot Zostavax:  No LMP recorded. (Menstrual status: IUD). Pap: saw last week, Dr. Valentino Saxon.  MGM:  DEXA: Colonoscopy: EGD:  Patient Care Team: Unk Pinto, MD as PCP - General (Internal Medicine)  Surgical History:  She has a past surgical history that includes Intrauterine device insertion; Adenoidectomy; Adenoidectomy; and Breast lumpectomy (Left, 04/2016). Family History:  Herfamily history includes Allergic rhinitis in her father and mother; Breast cancer in her maternal uncle and paternal aunt; Diabetes in her maternal grandmother; Heart disease in her maternal aunt; Migraines in her mother; Parkinsonism in her maternal grandfather; Skin cancer in her maternal aunt. Social History:  She reports that she has never smoked. She has never used smokeless tobacco. She reports that she does not drink alcohol or use drugs.  Review of Systems: Review of Systems  Constitutional: Negative.   HENT: Negative.   Eyes: Negative.   Respiratory: Positive for cough and wheezing. Negative for hemoptysis, sputum production and shortness of breath.   Cardiovascular: Negative.   Gastrointestinal: Negative.   Genitourinary: Negative.  Musculoskeletal: Negative.   Skin: Negative.     Physical Exam: Estimated body mass index is 34.97 kg/m as calculated from the following:   Height as of this encounter: 5' 3" (1.6 m).   Weight as of this encounter: 197 lb 6.4 oz (89.5 kg). BP 128/88   Pulse 64   Temp 97.9 F (36.6 C)   Ht 5' 3" (1.6 m)   Wt 197 lb 6.4 oz (89.5 kg)   SpO2 98%   BMI 34.97 kg/m  General Appearance: Well nourished, in no apparent distress.  Eyes: PERRLA, EOMs, conjunctiva no swelling or erythema, normal fundi and vessels.  Sinuses: No Frontal/maxillary tenderness  ENT/Mouth: Ext aud canals clear, normal light reflex with TMs without erythema, bulging. Good dentition. No erythema, swelling, or exudate on post  pharynx. Tonsils not swollen or erythematous. Hearing normal.  Neck: Supple, thyroid normal. No bruits  Respiratory: Respiratory effort normal, BS equal bilaterally without rales, rhonchi, wheezing or stridor.  Cardio: RRR without murmurs, rubs or gallops. Brisk peripheral pulses without edema.  Chest: symmetric, with normal excursions and percussion.  Breasts: defer Abdomen: Soft, nontender, no guarding, rebound, hernias, masses, or organomegaly.  Lymphatics: Non tender without lymphadenopathy.  Genitourinary: defer GYN Musculoskeletal: Full ROM all peripheral extremities,5/5 strength, and normal gait.  Skin: Warm, dry without rashes, lesions, ecchymosis. Neuro: Cranial nerves intact, reflexes equal bilaterally. Normal muscle tone, no cerebellar symptoms. Sensation intact.  Psych: Awake and oriented X 3, normal affect, Insight and Judgment appropriate.   EKG: defer AORTA SCAN: defer  Vicie Mutters 2:06 PM [facility]

## 2018-09-06 ENCOUNTER — Encounter: Payer: Self-pay | Admitting: Physician Assistant

## 2018-09-06 ENCOUNTER — Ambulatory Visit (INDEPENDENT_AMBULATORY_CARE_PROVIDER_SITE_OTHER): Payer: 59 | Admitting: Physician Assistant

## 2018-09-06 ENCOUNTER — Other Ambulatory Visit: Payer: Self-pay

## 2018-09-06 VITALS — BP 128/88 | HR 64 | Temp 97.9°F | Ht 63.0 in | Wt 197.4 lb

## 2018-09-06 DIAGNOSIS — G43709 Chronic migraine without aura, not intractable, without status migrainosus: Secondary | ICD-10-CM

## 2018-09-06 DIAGNOSIS — D649 Anemia, unspecified: Secondary | ICD-10-CM

## 2018-09-06 DIAGNOSIS — Z0000 Encounter for general adult medical examination without abnormal findings: Secondary | ICD-10-CM

## 2018-09-06 DIAGNOSIS — J3089 Other allergic rhinitis: Secondary | ICD-10-CM

## 2018-09-06 DIAGNOSIS — Z131 Encounter for screening for diabetes mellitus: Secondary | ICD-10-CM

## 2018-09-06 DIAGNOSIS — Z0001 Encounter for general adult medical examination with abnormal findings: Secondary | ICD-10-CM

## 2018-09-06 DIAGNOSIS — F3341 Major depressive disorder, recurrent, in partial remission: Secondary | ICD-10-CM

## 2018-09-06 DIAGNOSIS — E559 Vitamin D deficiency, unspecified: Secondary | ICD-10-CM

## 2018-09-06 DIAGNOSIS — Z1389 Encounter for screening for other disorder: Secondary | ICD-10-CM

## 2018-09-06 DIAGNOSIS — J302 Other seasonal allergic rhinitis: Secondary | ICD-10-CM

## 2018-09-06 DIAGNOSIS — Z13220 Encounter for screening for lipoid disorders: Secondary | ICD-10-CM

## 2018-09-06 DIAGNOSIS — K219 Gastro-esophageal reflux disease without esophagitis: Secondary | ICD-10-CM

## 2018-09-06 DIAGNOSIS — Z79899 Other long term (current) drug therapy: Secondary | ICD-10-CM

## 2018-09-06 DIAGNOSIS — J4540 Moderate persistent asthma, uncomplicated: Secondary | ICD-10-CM

## 2018-09-06 MED ORDER — FLUTICASONE-UMECLIDIN-VILANT 100-62.5-25 MCG/INH IN AEPB: 100.0000 ug | INHALATION_SPRAY | Freq: Every day | RESPIRATORY_TRACT | 6 refills | Status: DC

## 2018-09-06 MED ORDER — FLUTICASONE PROPIONATE 93 MCG/ACT NA EXHU: 2.0000 | INHALANT_SUSPENSION | Freq: Every day | NASAL | 2 refills | Status: DC

## 2018-09-06 NOTE — Patient Instructions (Signed)
GENERAL HEALTH GOALS  Know what a healthy weight is for you (roughly BMI <25) and aim to maintain this  Aim for 7+ servings of fruits and vegetables daily  70-80+ fluid ounces of water or unsweet tea for healthy kidneys  Limit to max 1 drink of alcohol per day; avoid smoking/tobacco  Limit animal fats in diet for cholesterol and heart health - choose grass fed whenever available  Avoid highly processed foods, and foods high in saturated/trans fats  Aim for low stress - take time to unwind and care for your mental health  Aim for 150 min of moderate intensity exercise weekly for heart health, and weights twice weekly for bone health  Aim for 7-9 hours of sleep daily   Google mindful eating and here are some tips and tricks below.   Rate your hunger before you eat on a scale of 1-10, try to eat closer to a 6 or higher. And if you are at below that, why are you eating? Slow down and listen to your body.

## 2018-09-07 LAB — MICROALBUMIN / CREATININE URINE RATIO
Creatinine, Urine: 22 mg/dL (ref 20–275)
Microalb, Ur: <0.2 mg/dL

## 2018-09-07 LAB — URINALYSIS, ROUTINE W REFLEX MICROSCOPIC
Bilirubin Urine: NEGATIVE
Glucose, UA: NEGATIVE
HGB URINE DIPSTICK: NEGATIVE
Ketones, ur: NEGATIVE
Leukocytes,Ua: NEGATIVE
Nitrite: NEGATIVE
PROTEIN: NEGATIVE
Specific Gravity, Urine: 1.006 (ref 1.001–1.03)
pH: 7 (ref 5.0–8.0)

## 2018-10-05 DIAGNOSIS — M542 Cervicalgia: Secondary | ICD-10-CM

## 2018-10-05 NOTE — Telephone Encounter (Signed)
THIS ENCOUNTER IS A VIRTUAL VISIT DUE TO COVID-19 - PATIENT WAS NOT SEEN IN THE OFFICE.  PATIENT HAS CONSENTED TO VIRTUAL VISIT / TELEMEDICINE VISIT   Virtual Visit via telephone Note  I connected with [name] on 10/05/2018 10/05/2018  by telephone.  I verified that I am speaking with the correct person using two identifiers.    I discussed the limitations of evaluation and management by telemedicine and the availability of in person appointments. The patient expressed understanding and agreed to proceed.  History of Present Illness: 23 y.o. WF calls after fall. She states she tripped over baby gate, fe. She fell onto vacuum handle upper right AB, some bruising but she is doing okay. Had nausea and HA but that is resolved. She states she did not hit her head, no LOC, she does have neck pain, very sore, had a hard time sleeping last night due to pain.   Took 400mg  ibuprofen last night. Did hot bath and has been taking magnesium. No numbness tingling hand or issues with grip.   Left wrist and left knee with bruising on it but states she can move them well without pain.   Medications  Current Outpatient Medications (Endocrine & Metabolic):  .  levonorgestrel (MIRENA) 20 MCG/24HR IUD, 1 each by Intrauterine route once.   Current Outpatient Medications (Respiratory):  .  azelastine (ASTELIN) 0.1 % nasal spray, Place 2 sprays into both nostrils 2 (two) times daily. Use in each nostril as directed .  Carbinoxamine Maleate 4 MG TABS, Take 1 tablet (4 mg total) by mouth every 6 (six) hours as needed. .  Fluticasone Propionate (XHANCE) 93 MCG/ACT EXHU, Place 2 Act into the nose at bedtime. .  Fluticasone-Umeclidin-Vilant (TRELEGY ELLIPTA) 100-62.5-25 MCG/INH AEPB, Inhale 100 mcg into the lungs daily. Marland Kitchen  levalbuterol (XOPENEX HFA) 45 MCG/ACT inhaler, INHALE 2 PUFFS INTO LUNGS EVERY 4 HOURS AS NEEDED FOR WHEEZING .  levocetirizine (XYZAL) 5 MG tablet, Take 1 tablet every night for  Allergies .  montelukast (SINGULAIR) 10 MG tablet, TAKE ONE TABLET BY MOUTH DAILY .  SYMBICORT 160-4.5 MCG/ACT inhaler, INHALE TWO PUFFS BY MOUTH TWICE A DAY .  Tiotropium Bromide Monohydrate (SPIRIVA RESPIMAT) 1.25 MCG/ACT AERS, Inhale 2 puffs into the lungs daily.    Current Outpatient Medications (Other):  Marland Kitchen  buPROPion (WELLBUTRIN XL) 150 MG 24 hr tablet, Take 150 mg by mouth daily. .  Cholecalciferol (VITAMIN D3) 5000 units CAPS, Take 1 capsule by mouth daily. .  fluconazole (DIFLUCAN) 150 MG tablet, Take 1 tablet  2 x /week  if needed for yeast infection  Problem list She has Morbid obesity (BMI 34+); Cyst of skin; History of keloid of skin; Moderate persistent asthma; Seasonal and perennial allergic rhinitis; Gastroesophageal reflux disease; Seasonal allergic conjunctivitis; Chronic migraine without aura without status migrainosus, not intractable; Depression, major, recurrent, in partial remission (HCC); and Recurrent infections on their problem list.   Observations/Objective: General Appearance:Well sounding, in no apparent distress.  ENT/Mouth: No hoarseness, No cough for duration of visit.  Respiratory: completing full sentences without distress, without audible wheeze Neuro: Awake and oriented X 3,  Psych:  Insight and Judgment appropriate.  Neck exam per patient- able to move her neck in all directions with mild pain, no pain with palpation of C7.   Assessment and Plan: Diagnoses and all orders for this visit:  Neck pain S/p fall, no LOC, did not hit her head.  Will do tylenol, ibuprofen and heating pad, has biofreeze If not  doing better will send in muscle relaxer  The patient was advised to call immediately if she has any concerning symptoms in the interval. The patient voices understanding of current treatment options and is in agreement with the current care plan.The patient knows to call the clinic with any problems, questions or concerns or go to the ER if any further  progression of symptoms.     Follow Up Instructions:    I discussed the assessment and treatment plan with the patient. The patient was provided an opportunity to ask questions and all were answered. The patient agreed with the plan and demonstrated an understanding of the instructions.   The patient was advised to call back or seek an in-person evaluation if the symptoms worsen or if the condition fails to improve as anticipated.  I provided 20 minutes of non-face-to-face time during this encounter.   Quentin MullingAmanda Sierah Lacewell, PA-C

## 2018-10-24 ENCOUNTER — Other Ambulatory Visit: Payer: Self-pay | Admitting: Allergy and Immunology

## 2018-10-24 DIAGNOSIS — J3089 Other allergic rhinitis: Secondary | ICD-10-CM

## 2018-10-24 DIAGNOSIS — J302 Other seasonal allergic rhinitis: Secondary | ICD-10-CM

## 2018-10-24 DIAGNOSIS — J4540 Moderate persistent asthma, uncomplicated: Secondary | ICD-10-CM

## 2018-10-24 NOTE — Telephone Encounter (Signed)
Courtesy refill.  Patient needs ov prior to 90 day refills.

## 2018-11-21 ENCOUNTER — Other Ambulatory Visit: Payer: Self-pay | Admitting: Allergy and Immunology

## 2018-11-21 DIAGNOSIS — J4540 Moderate persistent asthma, uncomplicated: Secondary | ICD-10-CM

## 2018-11-21 DIAGNOSIS — J302 Other seasonal allergic rhinitis: Secondary | ICD-10-CM

## 2018-11-21 DIAGNOSIS — J3089 Other allergic rhinitis: Secondary | ICD-10-CM

## 2018-11-23 ENCOUNTER — Other Ambulatory Visit: Payer: 59 | Admitting: Internal Medicine

## 2018-11-23 DIAGNOSIS — H60311 Diffuse otitis externa, right ear: Secondary | ICD-10-CM

## 2018-11-23 DIAGNOSIS — H6500 Acute serous otitis media, unspecified ear: Secondary | ICD-10-CM

## 2018-11-23 DIAGNOSIS — J40 Bronchitis, not specified as acute or chronic: Secondary | ICD-10-CM | POA: Diagnosis not present

## 2018-11-23 MED ORDER — DEXAMETHASONE 0.5 MG PO TABS: ORAL_TABLET | ORAL | 0 refills | Status: DC

## 2018-11-23 MED ORDER — NEOMYCIN-POLYMYXIN-HC 3.5-10000-1 OT SOLN: OTIC | 1 refills | Status: DC

## 2018-11-23 MED ORDER — AZITHROMYCIN 250 MG PO TABS: ORAL_TABLET | ORAL | 1 refills | Status: DC

## 2018-11-23 NOTE — Progress Notes (Signed)
THIS ENCOUNTER IS A VIRTUAL VISIT DUE TO COVID-19 - PATIENT WAS NOT SEEN IN THE OFFICE.  PATIENT HAS CONSENTED TO VIRTUAL VISIT / TELEMEDICINE VISIT   Virtual Visit via telephone Note  I connected with  [name]   on 11/23/2018 11/23/2018  by telephone.  I verified that I am speaking with the correct person using two identifiers.    I discussed the limitations of evaluation and management by telemedicine and the availability of in person appointments. The patient expressed understanding and agreed to proceed.  History of Present Illness:      Patient is a very nice 23 yo single WF in school around [place] with hx/o allergies & asthma. Today she called with c/o Rt ear pain & some drainage from her Rt ear.  Describes trigger point pain at the Rt TM jt. She has had sinus pressure, also  head congestion. She reports fever up to 100.3-101.1 deg. She reports a productive cough. Denies dyspnea.  Medications  .  levonorgestrel (MIRENA) 20 MCG/24HR IUD, 1 each by Intrauterine route once. Marland Kitchen  azelastine (ASTELIN) 0.1 % nasal spray, Place 2 sprays into both nostrils 2 (two) times daily. Use in each nostril as directed .  Carbinoxamine Maleate 4 MG TABS, Take 1 tablet (4 mg total) by mouth every 6 (six) hours as needed. .  Fluticasone Propionate (XHANCE) 93 MCG/ACT EXHU, Place 2 Act into the nose at bedtime. .  levalbuterol (XOPENEX HFA) 45 MCG/ACT inhaler, INHALE 2 PUFFS INTO LUNGS EVERY 4 HOURS AS NEEDED FOR WHEEZING .  levocetirizine (XYZAL) 5 MG tablet, Take 1 tablet every night for Allergies .  montelukast (SINGULAIR) 10 MG tablet, TAKE ONE TABLET BY MOUTH DAILY .  SYMBICORT 160-4.5 MCG/ACT inhaler, INHALE TWO PUFFS BY MOUTH TWICE A DAY .  Tiotropium Bromide Monohydrate (SPIRIVA RESPIMAT) 1.25 MCG/ACT AERS, Inhale 2 puffs into the lungs daily. Marland Kitchen  buPROPion (WELLBUTRIN XL) 150 MG 24 hr tablet, Take 150 mg by mouth daily. .  Cholecalciferol (VITAMIN D3) 5000 units CAPS, Take 1 capsule by mouth  daily. .  fluconazole (DIFLUCAN) 150 MG tablet, Take 1 tablet  2 x /week  if needed for yeast infection  Problem list She has Morbid obesity (BMI 34+); Cyst of skin; History of keloid of skin; Moderate persistent asthma; Seasonal and perennial allergic rhinitis; Gastroesophageal reflux disease; Seasonal allergic conjunctivitis; Chronic migraine without aura without status migrainosus, not intractable; Depression, major, recurrent, in partial remission (HCC); and Recurrent infections on their problem list.   Observations/Objective:  General : Well sounding patient in no apparent distress HEENT: no hoarseness, sl congested cough.  Lungs: speaks in complete sentences, no audible wheezing, no apparent distress Neurological: alert, oriented x 3 Psychiatric: pleasant, judgement appropriate   Assessment and Plan:  1. Acute serous otitis media, recurrence not specified, unspecified laterality  - azithromycin (ZITHROMAX) 250 MG tablet; Take 2 tablets (500 mg) on  Day 1,  followed by 1 tablet (250 mg) once daily on Days 2 through 5.  Dispense: 6 each; Refill: 1  2. Acute diffuse otitis externa of right ear  - azithromycin (ZITHROMAX) 250 MG tablet; Take 2 tablets (500 mg) on  Day 1,  followed by 1 tablet (250 mg) once daily on Days 2 through 5.  Dispense: 6 each; Refill: 1 - neomycin-polymyxin-hydrocortisone (CORTISPORIN) OTIC solution; Use 5 to 6 drops to the affected ear 3-4 x /day and occlude ear with cotton  Dispense: 10 mL; Refill: 1  3. Bronchitis  - azithromycin  250 MG tablet; Take 2 tablets (500 mg) on  Day 1,  followed by 1 tablet (250 mg) once daily on Days 2 through 5.  Dispense: 6 each; Refill: 1 - dexamethasone  0.5 MG tablet; Take 1 tab 3 x day - 3 days, then 2 x day - 3 days, then 1 tab daily  Dispense: 20 tablet; Refill: 0  Follow Up Instructions:   I discussed the assessment and treatment plan with the patient. The patient was provided an opportunity to ask questions and all  were answered. The patient agreed with the plan and demonstrated an understanding of the instructions.   The patient was advised to call back or seek an in-person evaluation if the symptoms worsen or if the condition fails to improve as anticipated.  I provided 16 minutes of non-face-to-face time during this encounter.  Marinus MawWilliam D Isobella Ascher, MD

## 2018-12-29 ENCOUNTER — Other Ambulatory Visit: Payer: Self-pay | Admitting: Family Medicine

## 2019-01-13 ENCOUNTER — Other Ambulatory Visit: Payer: Self-pay | Admitting: Allergy and Immunology

## 2019-01-13 DIAGNOSIS — J302 Other seasonal allergic rhinitis: Secondary | ICD-10-CM

## 2019-01-13 DIAGNOSIS — J4540 Moderate persistent asthma, uncomplicated: Secondary | ICD-10-CM

## 2019-01-14 ENCOUNTER — Other Ambulatory Visit: Payer: Self-pay

## 2019-01-14 DIAGNOSIS — J4540 Moderate persistent asthma, uncomplicated: Secondary | ICD-10-CM

## 2019-01-14 DIAGNOSIS — J302 Other seasonal allergic rhinitis: Secondary | ICD-10-CM

## 2019-01-14 NOTE — Telephone Encounter (Signed)
Patient needs office visit for more SINGULAIR

## 2019-01-14 NOTE — Telephone Encounter (Signed)
Courtesy refill already given 10/24/18 of montelukast. Pt needs ov.

## 2019-01-16 ENCOUNTER — Encounter: Payer: Self-pay | Admitting: Allergy

## 2019-01-16 ENCOUNTER — Ambulatory Visit (INDEPENDENT_AMBULATORY_CARE_PROVIDER_SITE_OTHER): Payer: PRIVATE HEALTH INSURANCE | Admitting: Allergy

## 2019-01-16 DIAGNOSIS — J3089 Other allergic rhinitis: Secondary | ICD-10-CM | POA: Diagnosis not present

## 2019-01-16 DIAGNOSIS — J302 Other seasonal allergic rhinitis: Secondary | ICD-10-CM

## 2019-01-16 DIAGNOSIS — B999 Unspecified infectious disease: Secondary | ICD-10-CM | POA: Diagnosis not present

## 2019-01-16 DIAGNOSIS — J069 Acute upper respiratory infection, unspecified: Secondary | ICD-10-CM

## 2019-01-16 DIAGNOSIS — J4540 Moderate persistent asthma, uncomplicated: Secondary | ICD-10-CM

## 2019-01-16 MED ORDER — MONTELUKAST SODIUM 10 MG PO TABS: 10.0000 mg | ORAL_TABLET | Freq: Every day | ORAL | 1 refills | Status: DC

## 2019-01-16 MED ORDER — PULMICORT FLEXHALER 90 MCG/ACT IN AEPB: INHALATION_SPRAY | RESPIRATORY_TRACT | 1 refills | Status: DC

## 2019-01-16 NOTE — Assessment & Plan Note (Signed)
Was doing well up until 1 week. Now has URI symptoms and having chest congestion. Patient does not tolerate oral prednisone and decadron did not work that well in the past.  Declines decadron at this time. . Start Pulmicort 90 mcg 2 puffs twice a day for the next 2 weeks.  Rinse mouth after use. Picked Pulmicort as she had issues with other ICS components in the past and Pulmicort has budesonide which is the same ICS as in Symbicort which she has been tolerating well.  . Daily controller medication(s):  Continue Symbicort 160 2 puffs twice a day with spacer and rinse mouth afterwards. o Continue Singulair 10 mg tablet daily at night. o Continue Spiriva 1.25 mcg 2 puffs once a day. . Prior to physical activity: May use albuterol rescue inhaler 2 puffs 5 to 15 minutes prior to strenuous physical activities. Marland Kitchen Rescue medications: May use albuterol rescue inhaler 2 puffs or nebulizer every 4 to 6 hours as needed for shortness of breath, chest tightness, coughing, and wheezing. Monitor frequency of use.  . During upper respiratory infections: Start Pulmicort 90 mcg 2 puffs twice a day for 1 to 2 weeks.

## 2019-01-16 NOTE — Assessment & Plan Note (Signed)
No recent skin testing. Issues with nasal congestion and sneezing. Used to be on allergy injections as a child but now has some anxiety regarding injections.   Consider repeat allergy testing in the future to see if any of your allergies have changed.  If there are still significant positives then recommend allergy injections to help control symptoms.   Continue montelukast 10 mg daily bedtime, and fluticasone nasal spray as needed.  May use over the counter antihistamines such as Zyrtec (cetirizine), Claritin (loratadine), Allegra (fexofenadine), or Xyzal (levocetirizine) daily as needed.  Nasal saline spray (i.e., Simply Saline) or nasal saline lavage (i.e., NeilMed) is recommended as needed and prior to medicated nasal sprays.

## 2019-01-16 NOTE — Assessment & Plan Note (Signed)
Persistent symptoms for the past 1 week.  Monitor symptoms and if not improving over the next 3 to 5 days then call our office or your PCP for further evaluation.   Continue over-the-counter pseudoephedrine and guaifenesin as needed.

## 2019-01-16 NOTE — Progress Notes (Signed)
RE: Lori West MRN: [id number] DOB: 03/30/1996 Date of Telemedicine Visit: 01/16/2019  Referring provider: Lucky CowboyMcKeown, William, MD Primary care provider: Lucky CowboyMcKeown, William, MD  Chief Complaint: Asthma   Telemedicine Follow Up Visit via Telephone: I connected with Lori West for a follow up on 01/16/19 by telephone and verified that I am speaking with the correct person using two identifiers.   I discussed the limitations, risks, security and privacy concerns of performing an evaluation and management service by telephone and the availability of in person appointments. I also discussed with the patient that there may be a patient responsible charge related to this service. The patient expressed understanding and agreed to proceed.  Patient is at home/work. Provider is at the office.  Visit start time: 11:39AM Visit end time: 12:09PM Insurance consent/check in by: front desk Medical consent and medical assistant/nurse: Meade MawKelli S.  History of Present Illness: She is a 23 y.o. female, who is being followed for asthma and allergic rhinitis, recurrent infections. Her previous allergy office visit was on 04/10/2018 with Dr. Nunzio CobbsBobbitt. Today is a regular follow up visit.  Assessment and plan: Moderate persistent asthma Currently on Symbicort 160 2 puffs BID with spacer and Spiriva 2 puffs daily and Singulair daily. Denies any ER/urgent care visits or prednisone use since the last visit.   Patient was doing well up until 1 week ago and started with nasal/chest congestion. She started to use xopenex with minimal benefit. Taking OTC pseudoephedrine and guaifenesin with some benefit. Denies any fevers or chills.  Symptoms staying the same and has not improved over the 1 week. Doing Flonase 2 sprays once a day and saline spray and nettipot. No nosebleeds. Hesitant about using antibiotics as she is she had history of C. difficile in the past. She is also intolerant of prednisone and Decadron  does not seem to work as well.  Seasonal and perennial allergic rhinitis Currently on Singulair daily and xyzal daily. Having nasal congestion, sneezing.  Used to be on allergy injections as a child but has needle anxiety. No recent allergy testing.  Recurrent infections Had 2 course of antibiotics since the last visit. Did no get bloodwork to look at immune system.  Assessment and Plan: Lori West is a 23 y.o. female with: Moderate persistent asthma Was doing well up until 1 week. Now has URI symptoms and having chest congestion. Patient does not tolerate oral prednisone and decadron did not work that well in the past.  Declines decadron at this time. . Start Pulmicort 90 mcg 2 puffs twice a day for the next 2 weeks.  Rinse mouth after use. Picked Pulmicort as she had issues with other ICS components in the past and Pulmicort has budesonide which is the same ICS as in Symbicort which she has been tolerating well.  . Daily controller medication(s):  Continue Symbicort 160 2 puffs twice a day with spacer and rinse mouth afterwards. o Continue Singulair 10 mg tablet daily at night. o Continue Spiriva 1.25 mcg 2 puffs once a day. . Prior to physical activity: May use albuterol rescue inhaler 2 puffs 5 to 15 minutes prior to strenuous physical activities. Marland Kitchen. Rescue medications: May use albuterol rescue inhaler 2 puffs or nebulizer every 4 to 6 hours as needed for shortness of breath, chest tightness, coughing, and wheezing. Monitor frequency of use.  . During upper respiratory infections: Start Pulmicort 90 mcg 2 puffs twice a day for 1 to 2 weeks.  Upper respiratory tract infection Persistent symptoms for the  past 1 week.  Monitor symptoms and if not improving over the next 3 to 5 days then call our office or your PCP for further evaluation.   Continue over-the-counter pseudoephedrine and guaifenesin as needed.  Recurrent infections Patient did not get bloodwork drawn. Had 2 courses of  antibiotics since the last visit. History of C diff in the past.   Keep track of infections.  Get bloodwork to look at your immune system - lab orders mailed to your home   Seasonal and perennial allergic rhinitis No recent skin testing. Issues with nasal congestion and sneezing. Used to be on allergy injections as a child but now has some anxiety regarding injections.   Consider repeat allergy testing in the future to see if any of your allergies have changed.  If there are still significant positives then recommend allergy injections to help control symptoms.   Continue montelukast 10 mg daily bedtime, and fluticasone nasal spray as needed.  May use over the counter antihistamines such as Zyrtec (cetirizine), Claritin (loratadine), Allegra (fexofenadine), or Xyzal (levocetirizine) daily as needed.  Nasal saline spray (i.e., Simply Saline) or nasal saline lavage (i.e., NeilMed) is recommended as needed and prior to medicated nasal sprays.  Return in about 2 months (around 03/19/2019).  Meds ordered this encounter  Medications  . montelukast (SINGULAIR) 10 MG tablet    Sig: Take 1 tablet (10 mg total) by mouth daily.    Dispense:  90 tablet    Refill:  1    Courtesy refill.  Patient needs ov prior to 90 day refills.  . Budesonide (PULMICORT FLEXHALER) 90 MCG/ACT inhaler    Sig: Take 2 puffs twice a day during upper respiratory infections for 1-2 weeks.    Dispense:  1 each    Refill:  1    Lab Orders     CBC with Differential/Platelet     Strep pneumoniae 23 Serotypes IgG     Diphtheria / Tetanus Antibody Panel     IgG, IgA, IgM  Diagnostics: None.  Medication List:  Current Outpatient Medications  Medication Sig Dispense Refill  . Albuterol Sulfate 108 (90 Base) MCG/ACT AEPB Inhale into the lungs.    Marland Kitchen. buPROPion (WELLBUTRIN XL) 150 MG 24 hr tablet Take 150 mg by mouth daily.    . Cholecalciferol (VITAMIN D3) 5000 units CAPS Take 1 capsule by mouth daily.    .  fluticasone (FLONASE) 50 MCG/ACT nasal spray Place 2 sprays into both nostrils daily.    Marland Kitchen. levalbuterol (XOPENEX HFA) 45 MCG/ACT inhaler INHALE 2 PUFFS INTO LUNGS EVERY 4 HOURS AS NEEDED FOR WHEEZING 15 g 2  . levocetirizine (XYZAL) 5 MG tablet Take 1 tablet every night for Allergies 90 tablet 3  . levonorgestrel (MIRENA) 20 MCG/24HR IUD 1 each by Intrauterine route once.    . montelukast (SINGULAIR) 10 MG tablet Take 1 tablet (10 mg total) by mouth daily. 90 tablet 1  . SYMBICORT 160-4.5 MCG/ACT inhaler INHALE TWO PUFFS BY MOUTH TWICE A DAY 1 Inhaler 1  . Tiotropium Bromide Monohydrate (SPIRIVA RESPIMAT) 1.25 MCG/ACT AERS Inhale 2 puffs into the lungs daily. 1 Inhaler 5  . Budesonide (PULMICORT FLEXHALER) 90 MCG/ACT inhaler Take 2 puffs twice a day during upper respiratory infections for 1-2 weeks. 1 each 1   No current facility-administered medications for this visit.    Allergies: Allergies  Allergen Reactions  . Breo Ellipta [Fluticasone Furoate-Vilanterol] Other (See Comments)    Thrush  . Peanut-Containing Drug Products Other (See Comments)  Tingling and numbness in throat from tree nuts  . Prednisone Other (See Comments)    Elevated temperature  . Penicillins Rash    Augmentin  . Alcohol-Sulfur [Sulfur]   . Benzyl Alcohol Other (See Comments)  . Metronidazole     Other reaction(s): Other (See Comments) Seizures and high temp.  . Toradol [Ketorolac Tromethamine]     Syncope, nausea   I reviewed her past medical history, social history, family history, and environmental history and no significant changes have been reported from previous visit on 04/10/2018.  Review of Systems  Constitutional: Negative for appetite change, chills, fever and unexpected weight change.  HENT: Positive for congestion. Negative for rhinorrhea.   Eyes: Negative for itching.  Respiratory: Negative for cough, chest tightness, shortness of breath and wheezing.   Gastrointestinal: Negative for  abdominal pain.  Skin: Negative for rash.  Allergic/Immunologic: Positive for environmental allergies.  Neurological: Negative for headaches.   Objective: Physical Exam Not obtained as encounter was done via telephone.  Patient speaking in full sentences without any respiratory distress.  No audible coughing or wheezing noted.  Previous notes and tests were reviewed.  I discussed the assessment and treatment plan with the patient. The patient was provided an opportunity to ask questions and all were answered. The patient agreed with the plan and demonstrated an understanding of the instructions. After visit summary/patient instructions available via mychart.   The patient was advised to call back or seek an in-person evaluation if the symptoms worsen or if the condition fails to improve as anticipated.  I provided 30 minutes of non-face-to-face time during this encounter.  It was my pleasure to participate in [place] Sossamon's care today. Please feel free to contact me with any questions or concerns.   Sincerely,  Rexene Alberts, DO Allergy & Immunology  [facility] office: [phone] [facility] office: [place] office: [phone]

## 2019-01-16 NOTE — Patient Instructions (Addendum)
Upper respiratory infection  Monitor symptoms and if not improving over the next 3 to 5 days then call our office or your PCP for further evaluation.   Continue over-the-counter pseudoephedrine and guaifenesin as needed.  Keep track of infections.  Get bloodwork to look at your immune system - lab orders mailed to your home.   Moderate persistent asthma . Start Pulmicort 90 mcg 2 puffs twice a day for the next 2 weeks.  Rinse mouth after use. . Daily controller medication(s):  Continue Symbicort 160 2 puffs twice a day with spacer and rinse mouth afterwards. o Continue Singulair 10 mg tablet daily at night. o Continue Spiriva 1.25 mcg 2 puffs once a day. . Prior to physical activity: May use albuterol rescue inhaler 2 puffs 5 to 15 minutes prior to strenuous physical activities. Marland Kitchen Rescue medications: May use albuterol rescue inhaler 2 puffs or nebulizer every 4 to 6 hours as needed for shortness of breath, chest tightness, coughing, and wheezing. Monitor frequency of use.  . During upper respiratory infections: Start Pulmicort 90 mcg 2 puffs twice a day for 1 to 2 weeks. . Asthma control goals:  o Full participation in all desired activities (may need albuterol before activity) o Albuterol use two times or less a week on average (not counting use with activity) o Cough interfering with sleep two times or less a month o Oral steroids no more than once a year o No hospitalizations  Seasonal and perennial allergic rhinitis  Consider repeat allergy testing in the future to see if any of your allergies have changed.  If there are still significant positives then recommend allergy injections to help control symptoms.   Continue montelukast 10 mg daily bedtime, and fluticasone nasal spray as needed.  May use over the counter antihistamines such as Zyrtec (cetirizine), Claritin (loratadine), Allegra (fexofenadine), or Xyzal (levocetirizine) daily as needed.  Nasal saline spray (i.e.,  Simply Saline) or nasal saline lavage (i.e., NeilMed) is recommended as needed and prior to medicated nasal sprays.  Follow up in 2-3 months.

## 2019-01-16 NOTE — Assessment & Plan Note (Addendum)
Patient did not get bloodwork drawn. Had 2 courses of antibiotics since the last visit. History of C diff in the past.   Keep track of infections.  Get bloodwork to look at your immune system - lab orders mailed to your home

## 2019-03-15 ENCOUNTER — Ambulatory Visit: Payer: 59 | Admitting: Physician Assistant

## 2019-03-27 NOTE — Progress Notes (Deleted)
Follow up  Assessment and Plan: Medication management -     CBC with Differential/Platelet -     COMPLETE METABOLIC PANEL WITH GFR -     Magnesium  Vitamin D deficiency -     VITAMIN D 25 Hydroxy (Vit-D Deficiency, Fractures)  Morbid obesity  - follow up 3 months for progress monitoring - increase veggies, decrease carbs - long discussion about weight loss, diet, and exercise  Anemia, unspecified type Check CBC  Depression, major, recurrent, in partial remission (HCC) -     TSH  Chronic migraine without aura without status migrainosus, not intractable Controlled , avoid triggers.  Moderate persistent asthma, unspecified whether complicated -     Fluticasone-Umeclidin-Vilant (TRELEGY ELLIPTA) 100-62.5-25 MCG/INH AEPB; Inhale 100 mcg into the lungs daily.  Seasonal and perennial allergic rhinitis -     Fluticasone Propionate (XHANCE) 93 MCG/ACT EXHU; Place 2 Act into the nose at bedtime.   Discussed med's effects and SE's. Screening labs and tests as requested with regular follow-up as recommended. Over 30 minutes of exam, counseling, chart review, and complex, high level critical decision making was performed this visit.   HPI  23 y.o. female  presents for follow up for has Morbid obesity (BMI 34+); Cyst of skin; History of keloid of skin; Moderate persistent asthma; Seasonal and perennial allergic rhinitis; Gastroesophageal reflux disease; Seasonal allergic conjunctivitis; Chronic migraine without aura without status migrainosus, not intractable; Depression, major, recurrent, in partial remission ([place]); Recurrent infections; and Upper respiratory tract infection on their problem list..  Her blood pressure has been controlled at home, today their BP is   She does workout. She denies chest pain, shortness of breath, dizziness.   She has history of allergies, has small cough now, she is on singulair, xyzal, flonase, saline spray, xopenex, symbicort and spiriva.   BMI is There  is no height or weight on file to calculate BMI., she is working on diet and exercise. Wt Readings from Last 3 Encounters:  09/06/18 197 lb 6.4 oz (89.5 kg)  02/28/18 202 lb 6.4 oz (91.8 kg)  02/07/18 201 lb 12.8 oz (91.5 kg)   She is not on cholesterol medication and denies myalgias. Her cholesterol is not   at goal. The cholesterol last visit was:   Lab Results  Component Value Date   CHOL 131 09/23/2016   HDL 54 09/23/2016   LDLCALC 64 09/23/2016   TRIG 66 09/23/2016   CHOLHDL 2.4 09/23/2016   Patient is on Vitamin D supplement.   Lab Results  Component Value Date   VD25OH 34 04/24/2015      Current Medications:  Current Outpatient Medications on File Prior to Visit  Medication Sig Dispense Refill  . Albuterol Sulfate 108 (90 Base) MCG/ACT AEPB Inhale into the lungs.    . Budesonide (PULMICORT FLEXHALER) 90 MCG/ACT inhaler Take 2 puffs twice a day during upper respiratory infections for 1-2 weeks. 1 each 1  . buPROPion (WELLBUTRIN XL) 150 MG 24 hr tablet Take 150 mg by mouth daily.    . Cholecalciferol (VITAMIN D3) 5000 units CAPS Take 1 capsule by mouth daily.    . fluticasone (FLONASE) 50 MCG/ACT nasal spray Place 2 sprays into both nostrils daily.    Marland Kitchen levalbuterol (XOPENEX HFA) 45 MCG/ACT inhaler INHALE 2 PUFFS INTO LUNGS EVERY 4 HOURS AS NEEDED FOR WHEEZING 15 g 2  . levocetirizine (XYZAL) 5 MG tablet Take 1 tablet every night for Allergies 90 tablet 3  . levonorgestrel (MIRENA) 20 MCG/24HR IUD  1 each by Intrauterine route once.    . montelukast (SINGULAIR) 10 MG tablet Take 1 tablet (10 mg total) by mouth daily. 90 tablet 1  . SYMBICORT 160-4.5 MCG/ACT inhaler INHALE TWO PUFFS BY MOUTH TWICE A DAY 1 Inhaler 1  . Tiotropium Bromide Monohydrate (SPIRIVA RESPIMAT) 1.25 MCG/ACT AERS Inhale 2 puffs into the lungs daily. 1 Inhaler 5   No current facility-administered medications on file prior to visit.    Allergies:  Allergies  Allergen Reactions  . Breo Ellipta  [Fluticasone Furoate-Vilanterol] Other (See Comments)    Thrush  . Peanut-Containing Drug Products Other (See Comments)    Tingling and numbness in throat from tree nuts  . Prednisone Other (See Comments)    Elevated temperature  . Penicillins Rash    Augmentin  . Alcohol-Sulfur [Sulfur]   . Benzyl Alcohol Other (See Comments)  . Metronidazole     Other reaction(s): Other (See Comments) Seizures and high temp.  . Toradol [Ketorolac Tromethamine]     Syncope, nausea   Medical History:  She has Morbid obesity (BMI 34+); Cyst of skin; History of keloid of skin; Moderate persistent asthma; Seasonal and perennial allergic rhinitis; Gastroesophageal reflux disease; Seasonal allergic conjunctivitis; Chronic migraine without aura without status migrainosus, not intractable; Depression, major, recurrent, in partial remission (HCC); Recurrent infections; and Upper respiratory tract infection on their problem list.   Surgical History: reviewed and unchanged Family History: reviewed and unchanged Social History: reviewed and unchanged  Review of Systems: Review of Systems  Constitutional: Negative.   HENT: Negative.   Eyes: Negative.   Respiratory: Negative for cough, hemoptysis, sputum production, shortness of breath and wheezing.   Cardiovascular: Negative.   Gastrointestinal: Negative.   Genitourinary: Negative.   Musculoskeletal: Negative.   Skin: Negative.     Physical Exam: Estimated body mass index is 34.97 kg/m as calculated from the following:   Height as of 09/06/18: 5\' 3"  (1.6 m).   Weight as of 09/06/18: 197 lb 6.4 oz (89.5 kg). There were no vitals taken for this visit. General Appearance: Well nourished, in no apparent distress.  Eyes: PERRLA, EOMs, conjunctiva no swelling or erythema, normal fundi and vessels.  Sinuses: No Frontal/maxillary tenderness  ENT/Mouth: Ext aud canals clear, normal light reflex with TMs without erythema, bulging. Good dentition. No erythema,  swelling, or exudate on post pharynx. Tonsils not swollen or erythematous. Hearing normal.  Neck: Supple, thyroid normal. No bruits  Respiratory: Respiratory effort normal, BS equal bilaterally without rales, rhonchi, wheezing or stridor.  Cardio: RRR without murmurs, rubs or gallops. Brisk peripheral pulses without edema.  Chest: symmetric, with normal excursions and percussion.  Abdomen: Soft, nontender, no guarding, rebound, hernias, masses, or organomegaly.  Lymphatics: Non tender without lymphadenopathy.  Musculoskeletal: Full ROM all peripheral extremities,5/5 strength, and normal gait.  Skin: Warm, dry without rashes, lesions, ecchymosis. Neuro: Cranial nerves intact, reflexes equal bilaterally. Normal muscle tone, no cerebellar symptoms. Sensation intact.  Psych: Awake and oriented X 3, normal affect, Insight and Judgment appropriate.    09/08/18 12:53 PM [facility]

## 2019-04-01 ENCOUNTER — Ambulatory Visit: Payer: 59 | Admitting: Physician Assistant

## 2019-04-02 ENCOUNTER — Ambulatory Visit: Payer: Self-pay | Admitting: Allergy and Immunology

## 2019-04-10 NOTE — Progress Notes (Deleted)
Follow up  Assessment and Plan: Medication management -     CBC with Differential/Platelet -     COMPLETE METABOLIC PANEL WITH GFR -     Magnesium  Vitamin D deficiency -     VITAMIN D 25 Hydroxy (Vit-D Deficiency, Fractures)  Morbid obesity  - follow up 3 months for progress monitoring - increase veggies, decrease carbs - long discussion about weight loss, diet, and exercise  Anemia, unspecified type Check CBC  Depression, major, recurrent, in partial remission (HCC) -     TSH  Chronic migraine without aura without status migrainosus, not intractable Controlled , avoid triggers.  Moderate persistent asthma, unspecified whether complicated -     Fluticasone-Umeclidin-Vilant (TRELEGY ELLIPTA) 100-62.5-25 MCG/INH AEPB; Inhale 100 mcg into the lungs daily.  Seasonal and perennial allergic rhinitis -     Fluticasone Propionate (XHANCE) 93 MCG/ACT EXHU; Place 2 Act into the nose at bedtime.   Discussed med's effects and SE's. Screening labs and tests as requested with regular follow-up as recommended. Over 30 minutes of exam, counseling, chart review, and complex, high level critical decision making was performed this visit.   HPI  23 y.o. female  presents for follow up for has Morbid obesity (BMI 34+); Cyst of skin; History of keloid of skin; Moderate persistent asthma; Seasonal and perennial allergic rhinitis; Gastroesophageal reflux disease; Seasonal allergic conjunctivitis; Chronic migraine without aura without status migrainosus, not intractable; Depression, major, recurrent, in partial remission ([place]); Recurrent infections; and Upper respiratory tract infection on their problem list..  Her blood pressure has been controlled at home, today their BP is   She does workout. She denies chest pain, shortness of breath, dizziness.   She has history of allergies, has small cough now, she is on singulair, xyzal, flonase, saline spray, xopenex, symbicort and spiriva.   BMI is There  is no height or weight on file to calculate BMI., she is working on diet and exercise. Wt Readings from Last 3 Encounters:  09/06/18 197 lb 6.4 oz (89.5 kg)  02/28/18 202 lb 6.4 oz (91.8 kg)  02/07/18 201 lb 12.8 oz (91.5 kg)   She is not on cholesterol medication and denies myalgias. Her cholesterol is not   at goal. The cholesterol last visit was:   Lab Results  Component Value Date   CHOL 131 09/23/2016   HDL 54 09/23/2016   LDLCALC 64 09/23/2016   TRIG 66 09/23/2016   CHOLHDL 2.4 09/23/2016   Patient is on Vitamin D supplement.   Lab Results  Component Value Date   VD25OH 34 04/24/2015      Current Medications:  Current Outpatient Medications on File Prior to Visit  Medication Sig Dispense Refill  . Albuterol Sulfate 108 (90 Base) MCG/ACT AEPB Inhale into the lungs.    . Budesonide (PULMICORT FLEXHALER) 90 MCG/ACT inhaler Take 2 puffs twice a day during upper respiratory infections for 1-2 weeks. 1 each 1  . buPROPion (WELLBUTRIN XL) 150 MG 24 hr tablet Take 150 mg by mouth daily.    . Cholecalciferol (VITAMIN D3) 5000 units CAPS Take 1 capsule by mouth daily.    . fluticasone (FLONASE) 50 MCG/ACT nasal spray Place 2 sprays into both nostrils daily.    Marland Kitchen levalbuterol (XOPENEX HFA) 45 MCG/ACT inhaler INHALE 2 PUFFS INTO LUNGS EVERY 4 HOURS AS NEEDED FOR WHEEZING 15 g 2  . levocetirizine (XYZAL) 5 MG tablet Take 1 tablet every night for Allergies 90 tablet 3  . levonorgestrel (MIRENA) 20 MCG/24HR IUD  1 each by Intrauterine route once.    . montelukast (SINGULAIR) 10 MG tablet Take 1 tablet (10 mg total) by mouth daily. 90 tablet 1  . SYMBICORT 160-4.5 MCG/ACT inhaler INHALE TWO PUFFS BY MOUTH TWICE A DAY 1 Inhaler 1  . Tiotropium Bromide Monohydrate (SPIRIVA RESPIMAT) 1.25 MCG/ACT AERS Inhale 2 puffs into the lungs daily. 1 Inhaler 5   No current facility-administered medications on file prior to visit.    Allergies:  Allergies  Allergen Reactions  . Breo Ellipta  [Fluticasone Furoate-Vilanterol] Other (See Comments)    Thrush  . Peanut-Containing Drug Products Other (See Comments)    Tingling and numbness in throat from tree nuts  . Prednisone Other (See Comments)    Elevated temperature  . Penicillins Rash    Augmentin  . Alcohol-Sulfur [Sulfur]   . Benzyl Alcohol Other (See Comments)  . Metronidazole     Other reaction(s): Other (See Comments) Seizures and high temp.  . Toradol [Ketorolac Tromethamine]     Syncope, nausea   Medical History:  She has Morbid obesity (BMI 34+); Cyst of skin; History of keloid of skin; Moderate persistent asthma; Seasonal and perennial allergic rhinitis; Gastroesophageal reflux disease; Seasonal allergic conjunctivitis; Chronic migraine without aura without status migrainosus, not intractable; Depression, major, recurrent, in partial remission (HCC); Recurrent infections; and Upper respiratory tract infection on their problem list.   Surgical History: reviewed and unchanged Family History: reviewed and unchanged Social History: reviewed and unchanged  Review of Systems: Review of Systems  Constitutional: Negative.   HENT: Negative.   Eyes: Negative.   Respiratory: Negative for cough, hemoptysis, sputum production, shortness of breath and wheezing.   Cardiovascular: Negative.   Gastrointestinal: Negative.   Genitourinary: Negative.   Musculoskeletal: Negative.   Skin: Negative.     Physical Exam: Estimated body mass index is 34.97 kg/m as calculated from the following:   Height as of 09/06/18: 5\' 3"  (1.6 m).   Weight as of 09/06/18: 197 lb 6.4 oz (89.5 kg). There were no vitals taken for this visit. General Appearance: Well nourished, in no apparent distress.  Eyes: PERRLA, EOMs, conjunctiva no swelling or erythema, normal fundi and vessels.  Sinuses: No Frontal/maxillary tenderness  ENT/Mouth: Ext aud canals clear, normal light reflex with TMs without erythema, bulging. Good dentition. No erythema,  swelling, or exudate on post pharynx. Tonsils not swollen or erythematous. Hearing normal.  Neck: Supple, thyroid normal. No bruits  Respiratory: Respiratory effort normal, BS equal bilaterally without rales, rhonchi, wheezing or stridor.  Cardio: RRR without murmurs, rubs or gallops. Brisk peripheral pulses without edema.  Chest: symmetric, with normal excursions and percussion.  Abdomen: Soft, nontender, no guarding, rebound, hernias, masses, or organomegaly.  Lymphatics: Non tender without lymphadenopathy.  Musculoskeletal: Full ROM all peripheral extremities,5/5 strength, and normal gait.  Skin: Warm, dry without rashes, lesions, ecchymosis. Neuro: Cranial nerves intact, reflexes equal bilaterally. Normal muscle tone, no cerebellar symptoms. Sensation intact.  Psych: Awake and oriented X 3, normal affect, Insight and Judgment appropriate.    09/08/18 1:51 PM [facility]

## 2019-04-15 ENCOUNTER — Ambulatory Visit: Payer: 59 | Admitting: Physician Assistant

## 2019-04-24 NOTE — Progress Notes (Deleted)
Follow up  Assessment and Plan: Medication management -     CBC with Differential/Platelet -     COMPLETE METABOLIC PANEL WITH GFR -     Magnesium  Vitamin D deficiency -     VITAMIN D 25 Hydroxy (Vit-D Deficiency, Fractures)  Morbid obesity  - follow up 3 months for progress monitoring - increase veggies, decrease carbs - long discussion about weight loss, diet, and exercise  Anemia, unspecified type Check CBC  Depression, major, recurrent, in partial remission (HCC) -     TSH  Chronic migraine without aura without status migrainosus, not intractable Controlled , avoid triggers.  Moderate persistent asthma, unspecified whether complicated -     Fluticasone-Umeclidin-Vilant (TRELEGY ELLIPTA) 100-62.5-25 MCG/INH AEPB; Inhale 100 mcg into the lungs daily.  Seasonal and perennial allergic rhinitis -     Fluticasone Propionate (XHANCE) 93 MCG/ACT EXHU; Place 2 Act into the nose at bedtime.   Discussed med's effects and SE's. Screening labs and tests as requested with regular follow-up as recommended. Over 30 minutes of exam, counseling, chart review, and complex, high level critical decision making was performed this visit.   HPI  23 y.o. female  presents for follow up for has Morbid obesity (BMI 34+); Cyst of skin; History of keloid of skin; Moderate persistent asthma; Seasonal and perennial allergic rhinitis; Gastroesophageal reflux disease; Seasonal allergic conjunctivitis; Chronic migraine without aura without status migrainosus, not intractable; Depression, major, recurrent, in partial remission ([place]); Recurrent infections; and Upper respiratory tract infection on their problem list..  Her blood pressure has been controlled at home, today their BP is   She does workout. She denies chest pain, shortness of breath, dizziness.   She has history of allergies, has small cough now, she is on singulair, xyzal, flonase, saline spray, xopenex, symbicort and spiriva.   BMI is There  is no height or weight on file to calculate BMI., she is working on diet and exercise. Wt Readings from Last 3 Encounters:  09/06/18 197 lb 6.4 oz (89.5 kg)  02/28/18 202 lb 6.4 oz (91.8 kg)  02/07/18 201 lb 12.8 oz (91.5 kg)   She is not on cholesterol medication and denies myalgias. Her cholesterol is not   at goal. The cholesterol last visit was:   Lab Results  Component Value Date   CHOL 131 09/23/2016   HDL 54 09/23/2016   LDLCALC 64 09/23/2016   TRIG 66 09/23/2016   CHOLHDL 2.4 09/23/2016   Patient is on Vitamin D supplement.   Lab Results  Component Value Date   VD25OH 34 04/24/2015      Current Medications:  Current Outpatient Medications on File Prior to Visit  Medication Sig Dispense Refill  . Albuterol Sulfate 108 (90 Base) MCG/ACT AEPB Inhale into the lungs.    . Budesonide (PULMICORT FLEXHALER) 90 MCG/ACT inhaler Take 2 puffs twice a day during upper respiratory infections for 1-2 weeks. 1 each 1  . buPROPion (WELLBUTRIN XL) 150 MG 24 hr tablet Take 150 mg by mouth daily.    . Cholecalciferol (VITAMIN D3) 5000 units CAPS Take 1 capsule by mouth daily.    . fluticasone (FLONASE) 50 MCG/ACT nasal spray Place 2 sprays into both nostrils daily.    Marland Kitchen levalbuterol (XOPENEX HFA) 45 MCG/ACT inhaler INHALE 2 PUFFS INTO LUNGS EVERY 4 HOURS AS NEEDED FOR WHEEZING 15 g 2  . levocetirizine (XYZAL) 5 MG tablet Take 1 tablet every night for Allergies 90 tablet 3  . levonorgestrel (MIRENA) 20 MCG/24HR IUD  1 each by Intrauterine route once.    . montelukast (SINGULAIR) 10 MG tablet Take 1 tablet (10 mg total) by mouth daily. 90 tablet 1  . SYMBICORT 160-4.5 MCG/ACT inhaler INHALE TWO PUFFS BY MOUTH TWICE A DAY 1 Inhaler 1  . Tiotropium Bromide Monohydrate (SPIRIVA RESPIMAT) 1.25 MCG/ACT AERS Inhale 2 puffs into the lungs daily. 1 Inhaler 5   No current facility-administered medications on file prior to visit.    Allergies:  Allergies  Allergen Reactions  . Breo Ellipta  [Fluticasone Furoate-Vilanterol] Other (See Comments)    Thrush  . Peanut-Containing Drug Products Other (See Comments)    Tingling and numbness in throat from tree nuts  . Prednisone Other (See Comments)    Elevated temperature  . Penicillins Rash    Augmentin  . Alcohol-Sulfur [Sulfur]   . Benzyl Alcohol Other (See Comments)  . Metronidazole     Other reaction(s): Other (See Comments) Seizures and high temp.  . Toradol [Ketorolac Tromethamine]     Syncope, nausea   Medical History:  She has Morbid obesity (BMI 34+); Cyst of skin; History of keloid of skin; Moderate persistent asthma; Seasonal and perennial allergic rhinitis; Gastroesophageal reflux disease; Seasonal allergic conjunctivitis; Chronic migraine without aura without status migrainosus, not intractable; Depression, major, recurrent, in partial remission ([place]); Recurrent infections; and Upper respiratory tract infection on their problem list.   Surgical History: reviewed and unchanged Family History: reviewed and unchanged Social History: reviewed and unchanged  Review of Systems: Review of Systems  Constitutional: Negative.   HENT: Negative.   Eyes: Negative.   Respiratory: Negative for cough, hemoptysis, sputum production, shortness of breath and wheezing.   Cardiovascular: Negative.   Gastrointestinal: Negative.   Genitourinary: Negative.   Musculoskeletal: Negative.   Skin: Negative.     Physical Exam: Estimated body mass index is 34.97 kg/m as calculated from the following:   Height as of 09/06/18: 5\' 3"  (1.6 m).   Weight as of 09/06/18: 197 lb 6.4 oz (89.5 kg). There were no vitals taken for this visit. General Appearance: Well nourished, in no apparent distress.  Eyes: PERRLA, EOMs, conjunctiva no swelling or erythema, normal fundi and vessels.  Sinuses: No Frontal/maxillary tenderness  ENT/Mouth: Ext aud canals clear, normal light reflex with TMs without erythema, bulging. Good dentition. No erythema,  swelling, or exudate on post pharynx. Tonsils not swollen or erythematous. Hearing normal.  Neck: Supple, thyroid normal. No bruits  Respiratory: Respiratory effort normal, BS equal bilaterally without rales, rhonchi, wheezing or stridor.  Cardio: RRR without murmurs, rubs or gallops. Brisk peripheral pulses without edema.  Chest: symmetric, with normal excursions and percussion.  Abdomen: Soft, nontender, no guarding, rebound, hernias, masses, or organomegaly.  Lymphatics: Non tender without lymphadenopathy.  Musculoskeletal: Full ROM all peripheral extremities,5/5 strength, and normal gait.  Skin: Warm, dry without rashes, lesions, ecchymosis. Neuro: Cranial nerves intact, reflexes equal bilaterally. Normal muscle tone, no cerebellar symptoms. Sensation intact.  Psych: Awake and oriented X 3, normal affect, Insight and Judgment appropriate.    Vicie Mutters 1:08 PM [facility]

## 2019-04-26 ENCOUNTER — Ambulatory Visit: Payer: 59 | Admitting: Physician Assistant

## 2019-05-08 DIAGNOSIS — Z79899 Other long term (current) drug therapy: Secondary | ICD-10-CM | POA: Insufficient documentation

## 2019-05-08 DIAGNOSIS — E559 Vitamin D deficiency, unspecified: Secondary | ICD-10-CM | POA: Insufficient documentation

## 2019-05-08 NOTE — Progress Notes (Deleted)
Follow up  Assessment and Plan: Medication management -     CBC with Differential/Platelet -     COMPLETE METABOLIC PANEL WITH GFR -     Magnesium  Vitamin D deficiency -     VITAMIN D 25 Hydroxy (Vit-D Deficiency, Fractures)  Morbid obesity  - follow up 3 months for progress monitoring - increase veggies, decrease carbs - long discussion about weight loss, diet, and exercise  Anemia, unspecified type Check CBC  Depression, major, recurrent, in partial remission (HCC) -     TSH  Chronic migraine without aura without status migrainosus, not intractable Controlled , avoid triggers.  Moderate persistent asthma, unspecified whether complicated -     Fluticasone-Umeclidin-Vilant (TRELEGY ELLIPTA) 100-62.5-25 MCG/INH AEPB; Inhale 100 mcg into the lungs daily.  Seasonal and perennial allergic rhinitis -     Fluticasone Propionate (XHANCE) 93 MCG/ACT EXHU; Place 2 Act into the nose at bedtime.   Discussed med's effects and SE's. Screening labs and tests as requested with regular follow-up as recommended. Over 30 minutes of exam, counseling, chart review, and complex, high level critical decision making was performed this visit.   HPI  23 y.o. female  presents for follow up for has Morbid obesity (BMI 34+); Cyst of skin; History of keloid of skin; Moderate persistent asthma; Seasonal and perennial allergic rhinitis; Gastroesophageal reflux disease; Seasonal allergic conjunctivitis; Chronic migraine without aura without status migrainosus, not intractable; Depression, major, recurrent, in partial remission ([place]); Recurrent infections; and Upper respiratory tract infection on their problem list..  Her blood pressure has been controlled at home, today their BP is   She does workout. She denies chest pain, shortness of breath, dizziness.   She has history of allergies, has small cough now, she is on singulair, xyzal, flonase, saline spray, xopenex, symbicort and spiriva.   BMI is There  is no height or weight on file to calculate BMI., she is working on diet and exercise. Wt Readings from Last 3 Encounters:  09/06/18 197 lb 6.4 oz (89.5 kg)  02/28/18 202 lb 6.4 oz (91.8 kg)  02/07/18 201 lb 12.8 oz (91.5 kg)   She is not on cholesterol medication and denies myalgias. Her cholesterol is not   at goal. The cholesterol last visit was:   Lab Results  Component Value Date   CHOL 131 09/23/2016   HDL 54 09/23/2016   LDLCALC 64 09/23/2016   TRIG 66 09/23/2016   CHOLHDL 2.4 09/23/2016   Patient is on Vitamin D supplement.   Lab Results  Component Value Date   VD25OH 34 04/24/2015      Current Medications:  Current Outpatient Medications on File Prior to Visit  Medication Sig Dispense Refill  . Albuterol Sulfate 108 (90 Base) MCG/ACT AEPB Inhale into the lungs.    . Budesonide (PULMICORT FLEXHALER) 90 MCG/ACT inhaler Take 2 puffs twice a day during upper respiratory infections for 1-2 weeks. 1 each 1  . buPROPion (WELLBUTRIN XL) 150 MG 24 hr tablet Take 150 mg by mouth daily.    . Cholecalciferol (VITAMIN D3) 5000 units CAPS Take 1 capsule by mouth daily.    . fluticasone (FLONASE) 50 MCG/ACT nasal spray Place 2 sprays into both nostrils daily.    Marland Kitchen levalbuterol (XOPENEX HFA) 45 MCG/ACT inhaler INHALE 2 PUFFS INTO LUNGS EVERY 4 HOURS AS NEEDED FOR WHEEZING 15 g 2  . levocetirizine (XYZAL) 5 MG tablet Take 1 tablet every night for Allergies 90 tablet 3  . levonorgestrel (MIRENA) 20 MCG/24HR IUD  1 each by Intrauterine route once.    . montelukast (SINGULAIR) 10 MG tablet Take 1 tablet (10 mg total) by mouth daily. 90 tablet 1  . SYMBICORT 160-4.5 MCG/ACT inhaler INHALE TWO PUFFS BY MOUTH TWICE A DAY 1 Inhaler 1  . Tiotropium Bromide Monohydrate (SPIRIVA RESPIMAT) 1.25 MCG/ACT AERS Inhale 2 puffs into the lungs daily. 1 Inhaler 5   No current facility-administered medications on file prior to visit.    Allergies:  Allergies  Allergen Reactions  . Breo Ellipta  [Fluticasone Furoate-Vilanterol] Other (See Comments)    Thrush  . Peanut-Containing Drug Products Other (See Comments)    Tingling and numbness in throat from tree nuts  . Prednisone Other (See Comments)    Elevated temperature  . Penicillins Rash    Augmentin  . Alcohol-Sulfur [Sulfur]   . Benzyl Alcohol Other (See Comments)  . Metronidazole     Other reaction(s): Other (See Comments) Seizures and high temp.  . Toradol [Ketorolac Tromethamine]     Syncope, nausea   Medical History:  She has Morbid obesity (BMI 34+); Cyst of skin; History of keloid of skin; Moderate persistent asthma; Seasonal and perennial allergic rhinitis; Gastroesophageal reflux disease; Seasonal allergic conjunctivitis; Chronic migraine without aura without status migrainosus, not intractable; Depression, major, recurrent, in partial remission (HCC); Recurrent infections; and Upper respiratory tract infection on their problem list.   Surgical History: reviewed and unchanged Family History: reviewed and unchanged Social History: reviewed and unchanged  Review of Systems: Review of Systems  Constitutional: Negative.   HENT: Negative.   Eyes: Negative.   Respiratory: Negative for cough, hemoptysis, sputum production, shortness of breath and wheezing.   Cardiovascular: Negative.   Gastrointestinal: Negative.   Genitourinary: Negative.   Musculoskeletal: Negative.   Skin: Negative.     Physical Exam: Estimated body mass index is 34.97 kg/m as calculated from the following:   Height as of 09/06/18: 5\' 3"  (1.6 m).   Weight as of 09/06/18: 197 lb 6.4 oz (89.5 kg). There were no vitals taken for this visit. General Appearance: Well nourished, in no apparent distress.  Eyes: PERRLA, EOMs, conjunctiva no swelling or erythema, normal fundi and vessels.  Sinuses: No Frontal/maxillary tenderness  ENT/Mouth: Ext aud canals clear, normal light reflex with TMs without erythema, bulging. Good dentition. No erythema,  swelling, or exudate on post pharynx. Tonsils not swollen or erythematous. Hearing normal.  Neck: Supple, thyroid normal. No bruits  Respiratory: Respiratory effort normal, BS equal bilaterally without rales, rhonchi, wheezing or stridor.  Cardio: RRR without murmurs, rubs or gallops. Brisk peripheral pulses without edema.  Chest: symmetric, with normal excursions and percussion.  Abdomen: Soft, nontender, no guarding, rebound, hernias, masses, or organomegaly.  Lymphatics: Non tender without lymphadenopathy.  Musculoskeletal: Full ROM all peripheral extremities,5/5 strength, and normal gait.  Skin: Warm, dry without rashes, lesions, ecchymosis. Neuro: Cranial nerves intact, reflexes equal bilaterally. Normal muscle tone, no cerebellar symptoms. Sensation intact.  Psych: Awake and oriented X 3, normal affect, Insight and Judgment appropriate.    09/08/18 12:25 PM [facility]

## 2019-05-10 ENCOUNTER — Ambulatory Visit: Payer: 59 | Admitting: Physician Assistant

## 2019-06-13 ENCOUNTER — Other Ambulatory Visit: Payer: Self-pay | Admitting: Allergy and Immunology

## 2019-06-13 ENCOUNTER — Other Ambulatory Visit: Payer: Self-pay | Admitting: Family Medicine

## 2019-06-17 ENCOUNTER — Other Ambulatory Visit: Payer: Self-pay

## 2019-06-22 ENCOUNTER — Other Ambulatory Visit: Payer: Self-pay | Admitting: Allergy

## 2019-06-22 ENCOUNTER — Other Ambulatory Visit: Payer: Self-pay | Admitting: Internal Medicine

## 2019-06-22 DIAGNOSIS — J302 Other seasonal allergic rhinitis: Secondary | ICD-10-CM

## 2019-06-22 DIAGNOSIS — J310 Chronic rhinitis: Secondary | ICD-10-CM

## 2019-06-22 DIAGNOSIS — J4540 Moderate persistent asthma, uncomplicated: Secondary | ICD-10-CM

## 2019-06-27 ENCOUNTER — Encounter: Payer: Self-pay | Admitting: Physician Assistant

## 2019-07-15 ENCOUNTER — Other Ambulatory Visit: Payer: Self-pay | Admitting: Internal Medicine

## 2019-07-15 DIAGNOSIS — J310 Chronic rhinitis: Secondary | ICD-10-CM

## 2019-07-16 ENCOUNTER — Other Ambulatory Visit: Payer: Self-pay | Admitting: Allergy

## 2019-07-16 DIAGNOSIS — J302 Other seasonal allergic rhinitis: Secondary | ICD-10-CM

## 2019-07-16 DIAGNOSIS — J4540 Moderate persistent asthma, uncomplicated: Secondary | ICD-10-CM

## 2019-07-24 ENCOUNTER — Other Ambulatory Visit: Payer: Self-pay

## 2019-07-24 DIAGNOSIS — J302 Other seasonal allergic rhinitis: Secondary | ICD-10-CM

## 2019-07-24 DIAGNOSIS — J3089 Other allergic rhinitis: Secondary | ICD-10-CM

## 2019-07-24 DIAGNOSIS — J4540 Moderate persistent asthma, uncomplicated: Secondary | ICD-10-CM

## 2019-07-24 MED ORDER — MONTELUKAST SODIUM 10 MG PO TABS: 10.0000 mg | ORAL_TABLET | Freq: Every day | ORAL | 1 refills | Status: DC

## 2019-09-09 NOTE — Progress Notes (Deleted)
Complete Physical  Assessment and Plan:  Encounter for general adult medical examination with abnormal findings 1 year  Medication management -     CBC with Differential/Platelet -     COMPLETE METABOLIC PANEL WITH GFR -     Magnesium  Vitamin D deficiency *** -     VITAMIN D 25 Hydroxy (Vit-D Deficiency, Fractures)  Obesity - BMI 34 - follow up 3 months for progress monitoring - increase veggies, decrease carbs - long discussion about weight loss, diet, and exercise  Anemia, unspecified type Check CBC  Depression, major, recurrent, in partial remission (HCC) Continue medications  Lifestyle discussed: diet/exerise, sleep hygiene, stress management, hydration -     TSH  Chronic migraine without aura without status migrainosus, not intractable Controlled , avoid triggers.  Moderate persistent asthma, unspecified whether complicated Continue inhalers, follows with Dr. Maudie Mercury  Seasonal and perennial allergic rhinitis Continue current medications, hygiene reviewed  Gastroesophageal reflux disease, esophagitis presence not specified Continue PPI/H2 blocker, diet discussed  Screening for hematuria or proteinuria -     Urinalysis, Routine w reflex microscopic   No orders of the defined types were placed in this encounter.    Discussed med's effects and SE's. Screening labs and tests as requested with regular follow-up as recommended. Over 40 minutes of exam, counseling, chart review, and complex, high level critical decision making was performed this visit.   Future Appointments  Date Time Provider [place]  09/10/2019  2:00 PM Liane Comber, NP [facility] None  09/09/2020  2:00 PM Liane Comber, NP [facility] None     HPI  24 y.o. female  presents for a complete physical and follow up for has Obesity (BMI 30.0-34.9); Cyst of skin; History of keloid of skin; Moderate persistent asthma; Seasonal and perennial allergic rhinitis; Gastroesophageal reflux  disease; Seasonal allergic conjunctivitis; Chronic migraine without aura without status migrainosus, not intractable; Depression, major, recurrent, in partial remission ([place]); Recurrent infections; Vitamin D deficiency; and Medication management on their problem list.   She is ***  Follows with GYN  Her blood pressure has been controlled at home, today their BP is   She does workout. She denies chest pain, shortness of breath, dizziness.   She has history of allergies and asthma; she is on singulair, xyzal, flonase, saline spray, xopenex, symbicort and spiriva.  Following with Dr. Maudie Mercury; he had ordered "immune" labs - *** never got   *** what labs ? High deductible  *** review dx ? cyst  She has hx of depression in partial remission on wellbutrin ***  BMI is There is no height or weight on file to calculate BMI., she is working on diet and exercise. Wt Readings from Last 3 Encounters:  09/06/18 197 lb 6.4 oz (89.5 kg)  02/28/18 202 lb 6.4 oz (91.8 kg)  02/07/18 201 lb 12.8 oz (91.5 kg)   Her blood pressure {HAS HAS NOT:18834} been controlled at home, today their BP is    She {DOES_DOES KGM:01027} workout. She denies chest pain, shortness of breath, dizziness.  She is not on cholesterol medication and denies myalgias. Her cholesterol is   at goal. The cholesterol last visit was:   Lab Results  Component Value Date   CHOL 131 09/23/2016   HDL 54 09/23/2016   LDLCALC 64 09/23/2016   TRIG 66 09/23/2016   CHOLHDL 2.4 09/23/2016    Last A1C in the office was:  Lab Results  Component Value Date   HGBA1C 4.4 09/23/2016   Last GFR:  Lab Results  Component Value Date   GFRNONAA 123 12/19/2017   Patient is *** on Vitamin D supplement.   Lab Results  Component Value Date   VD25OH 34 04/24/2015      Current Medications:  Current Outpatient Medications on File Prior to Visit  Medication Sig Dispense Refill  . Albuterol Sulfate 108 (90 Base) MCG/ACT AEPB Inhale into the lungs.     . Budesonide (PULMICORT FLEXHALER) 90 MCG/ACT inhaler Take 2 puffs twice a day during upper respiratory infections for 1-2 weeks. 1 each 1  . buPROPion (WELLBUTRIN XL) 150 MG 24 hr tablet Take 150 mg by mouth daily.    . Cholecalciferol (VITAMIN D3) 5000 units CAPS Take 1 capsule by mouth daily.    . fluticasone (FLONASE) 50 MCG/ACT nasal spray Place 2 sprays into both nostrils daily.    Marland Kitchen levalbuterol (XOPENEX HFA) 45 MCG/ACT inhaler INHALE 2 PUFFS INTO LUNGS EVERY 4 HOURS AS NEEDED FOR WHEEZING 15 g 2  . levocetirizine (XYZAL) 5 MG tablet TAKE ONE TABLET BY MOUTH EVERY EVENING FOR ALLERGIES 90 tablet 2  . levonorgestrel (MIRENA) 20 MCG/24HR IUD 1 each by Intrauterine route once.    . montelukast (SINGULAIR) 10 MG tablet Take 1 tablet (10 mg total) by mouth daily. 90 tablet 1  . SYMBICORT 160-4.5 MCG/ACT inhaler INHALE TWO PUFFS BY MOUTH TWICE A DAY 1 Inhaler 1  . Tiotropium Bromide Monohydrate (SPIRIVA RESPIMAT) 1.25 MCG/ACT AERS Inhale 2 puffs into the lungs daily. 1 Inhaler 5   No current facility-administered medications on file prior to visit.   Allergies:  Allergies  Allergen Reactions  . Breo Ellipta [Fluticasone Furoate-Vilanterol] Other (See Comments)    Thrush  . Peanut-Containing Drug Products Other (See Comments)    Tingling and numbness in throat from tree nuts  . Prednisone Other (See Comments)    Elevated temperature  . Penicillins Rash    Augmentin  . Alcohol-Sulfur [Sulfur]   . Benzyl Alcohol Other (See Comments)  . Metronidazole     Other reaction(s): Other (See Comments) Seizures and high temp.  . Toradol [Ketorolac Tromethamine]     Syncope, nausea   Medical History:  She has Obesity (BMI 30.0-34.9); Cyst of skin; History of keloid of skin; Moderate persistent asthma; Seasonal and perennial allergic rhinitis; Gastroesophageal reflux disease; Seasonal allergic conjunctivitis; Chronic migraine without aura without status migrainosus, not intractable; Depression,  major, recurrent, in partial remission ([place]); Recurrent infections; Vitamin D deficiency; and Medication management on their problem list.    Health Maintenance:   Immunization History  Administered Date(s) Administered  . DTaP 07/05/1994, 09/12/1994, 11/10/1994, 05/15/1995  . Hepatitis A 08/10/2005, 06/27/2006  . Hepatitis B 19-Mar-1994, 07/05/1994, 11/10/1994  . IPV 07/05/1994, 09/12/1994, 11/10/1994, 04/28/1998  . MMR 05/15/1995, 04/26/1999  . Meningococcal Conjugate 07/06/2015  . Tdap 06/17/2008  . Varicella 05/15/1995, 08/10/2005    Tetanus: 2009 Pneumovax: *** has she had Prevnar 13:  Flu vaccine: get the flu shot Zostavax: n/a  No LMP recorded. (Menstrual status: IUD). Pap: saw last week, Dr. Valentino Saxon.  MGM:  Colonoscopy: EGD:  Patient Care Team: Unk Pinto, MD as PCP - General (Internal Medicine)  Surgical History:  She has a past surgical history that includes Intrauterine device insertion; Adenoidectomy; Adenoidectomy; and Breast lumpectomy (Left, 04/2016). Family History:  Herfamily history includes Allergic rhinitis in her father and mother; Breast cancer in her maternal uncle and paternal aunt; Diabetes in her maternal grandmother; Heart disease in her maternal aunt; Migraines in her mother; Parkinsonism in  her maternal grandfather; Skin cancer in her maternal aunt. Social History:  She reports that she has never smoked. She has never used smokeless tobacco. She reports that she does not drink alcohol or use drugs.  Review of Systems: Review of Systems  Constitutional: Negative.  Negative for malaise/fatigue and weight loss.  HENT: Negative.  Negative for hearing loss and tinnitus.   Eyes: Negative.  Negative for blurred vision and double vision.  Respiratory: Negative for cough, hemoptysis, sputum production, shortness of breath and wheezing.   Cardiovascular: Negative.  Negative for chest pain, palpitations, orthopnea, claudication and leg swelling.   Gastrointestinal: Negative.  Negative for abdominal pain, blood in stool, constipation, diarrhea, heartburn, melena, nausea and vomiting.  Genitourinary: Negative.   Musculoskeletal: Negative.  Negative for joint pain and myalgias.  Skin: Negative.  Negative for rash.  Neurological: Negative for dizziness, tingling, sensory change, weakness and headaches.  Endo/Heme/Allergies: Negative for polydipsia.  Psychiatric/Behavioral: Negative.   All other systems reviewed and are negative.   Physical Exam: Estimated body mass index is 34.97 kg/m as calculated from the following:   Height as of 09/06/18: 5' 3" (1.6 m).   Weight as of 09/06/18: 197 lb 6.4 oz (89.5 kg). There were no vitals taken for this visit. General Appearance: Well nourished, in no apparent distress.  Eyes: PERRLA, EOMs, conjunctiva no swelling or erythema Sinuses: No Frontal/maxillary tenderness  ENT/Mouth: Ext aud canals clear, normal light reflex with TMs without erythema, bulging. Good dentition. No erythema, swelling, or exudate on post pharynx. Tonsils not swollen or erythematous. Hearing normal.  Neck: Supple, thyroid normal. No bruits  Respiratory: Respiratory effort normal, BS equal bilaterally without rales, rhonchi, wheezing or stridor.  Cardio: RRR without murmurs, rubs or gallops. Brisk peripheral pulses without edema.  Chest: symmetric, with normal excursions and percussion.  Breasts: defer to GYN Abdomen: Soft, nontender, no guarding, rebound, hernias, masses, or organomegaly.  Lymphatics: Non tender without lymphadenopathy.  Genitourinary: defer GYN Musculoskeletal: Full ROM all peripheral extremities,5/5 strength, and normal gait.  Skin: Warm, dry without rashes, lesions, ecchymosis. Neuro: Cranial nerves intact, reflexes equal bilaterally. Normal muscle tone, no cerebellar symptoms. Sensation intact.  Psych: Awake and oriented X 3, normal affect, Insight and Judgment appropriate.   EKG: baseline in  system, no concerns, defer   Lori West 12:28 PM [facility]

## 2019-09-10 ENCOUNTER — Encounter: Payer: Self-pay | Admitting: Physician Assistant

## 2019-09-10 ENCOUNTER — Encounter: Payer: Self-pay | Admitting: Adult Health

## 2019-10-23 ENCOUNTER — Other Ambulatory Visit: Payer: Self-pay

## 2019-10-23 ENCOUNTER — Other Ambulatory Visit: Payer: Self-pay | Admitting: Allergy

## 2019-10-23 DIAGNOSIS — J302 Other seasonal allergic rhinitis: Secondary | ICD-10-CM

## 2019-10-23 DIAGNOSIS — J4540 Moderate persistent asthma, uncomplicated: Secondary | ICD-10-CM

## 2019-10-23 DIAGNOSIS — J3089 Other allergic rhinitis: Secondary | ICD-10-CM

## 2019-10-23 MED ORDER — MONTELUKAST SODIUM 10 MG PO TABS: 10.0000 mg | ORAL_TABLET | Freq: Every day | ORAL | 1 refills | Status: DC

## 2019-11-01 ENCOUNTER — Encounter: Payer: Self-pay | Admitting: Physician Assistant

## 2019-11-01 ENCOUNTER — Ambulatory Visit (INDEPENDENT_AMBULATORY_CARE_PROVIDER_SITE_OTHER): Payer: 59 | Admitting: Physician Assistant

## 2019-11-01 ENCOUNTER — Other Ambulatory Visit: Payer: Self-pay

## 2019-11-01 VITALS — BP 126/88 | HR 78 | Temp 98.0°F | Ht 64.0 in | Wt 220.0 lb

## 2019-11-01 DIAGNOSIS — G43709 Chronic migraine without aura, not intractable, without status migrainosus: Secondary | ICD-10-CM

## 2019-11-01 DIAGNOSIS — Z0000 Encounter for general adult medical examination without abnormal findings: Secondary | ICD-10-CM

## 2019-11-01 DIAGNOSIS — F3341 Major depressive disorder, recurrent, in partial remission: Secondary | ICD-10-CM

## 2019-11-01 DIAGNOSIS — Z13220 Encounter for screening for lipoid disorders: Secondary | ICD-10-CM

## 2019-11-01 DIAGNOSIS — Z131 Encounter for screening for diabetes mellitus: Secondary | ICD-10-CM

## 2019-11-01 DIAGNOSIS — E559 Vitamin D deficiency, unspecified: Secondary | ICD-10-CM

## 2019-11-01 DIAGNOSIS — Z1389 Encounter for screening for other disorder: Secondary | ICD-10-CM

## 2019-11-01 DIAGNOSIS — J4540 Moderate persistent asthma, uncomplicated: Secondary | ICD-10-CM

## 2019-11-01 DIAGNOSIS — D649 Anemia, unspecified: Secondary | ICD-10-CM

## 2019-11-01 DIAGNOSIS — Z79899 Other long term (current) drug therapy: Secondary | ICD-10-CM

## 2019-11-01 NOTE — Progress Notes (Signed)
Complete Physical  Assessment and Plan: Encounter for general adult medical examination with abnormal findings 1 year  Medication management -     CBC with Differential/Platelet -     COMPLETE METABOLIC PANEL WITH GFR -     Magnesium  Vitamin D deficiency -     VITAMIN D 25 Hydroxy (Vit-D Deficiency, Fractures)  Screening cholesterol level -     Lipid panel  Morbid obesity  - follow up 3 months for progress monitoring - increase veggies, decrease carbs - long discussion about weight loss, diet, and exercise  Anemia, unspecified type Check CBC  Depression, major, recurrent, in partial remission (HCC) -     TSH  Chronic migraine without aura without status migrainosus, not intractable Controlled , avoid triggers.  Moderate persistent asthma, unspecified whether complicated -     Fluticasone-Umeclidin-Vilant (TRELEGY ELLIPTA) 100-62.5-25 MCG/INH AEPB; Inhale 100 mcg into the lungs daily.  Seasonal and perennial allergic rhinitis -     Fluticasone Propionate (XHANCE) 93 MCG/ACT EXHU; Place 2 Act into the nose at bedtime.  Gastroesophageal reflux disease, esophagitis presence not specified Continue PPI/H2 blocker, diet discussed  Screening for hematuria or proteinuria -     Urinalysis, Routine w reflex microscopic -     Microalbumin / creatinine urine ratio   Discussed med's effects and SE's. Screening labs and tests as requested with regular follow-up as recommended. Over 40 minutes of exam, counseling, chart review, and complex, high level critical decision making was performed this visit.   HPI  24 y.o. female  presents for a complete physical and follow up for has Obesity (BMI 30.0-34.9); Cyst of skin; History of keloid of skin; Moderate persistent asthma; Seasonal and perennial allergic rhinitis; Gastroesophageal reflux disease; Seasonal allergic conjunctivitis; Chronic migraine without aura without status migrainosus, not intractable; Depression, major, recurrent, in  partial remission ([place]); Recurrent infections; Vitamin D deficiency; and Medication management on their problem list..  Her blood pressure has been controlled at home, today their BP is BP: 126/88 She does workout. She denies chest pain, shortness of breath, dizziness.   She has history of allergies, has small cough now, she is on singulair, xyzal, flonase, saline spray, xopenex, symbicort and spiriva.   BMI is Body mass index is 37.76 kg/m., she has been doing orange theory 4 x a week, 1500 calories on not work out days and 1750 calories.  Yogurt, avocado toast, lunch cauliflower wraps, tortilla wraps, and dinner is salad. Fruit. Nuts. Wt Readings from Last 3 Encounters:  11/01/19 220 lb (99.8 kg)  09/06/18 197 lb 6.4 oz (89.5 kg)  02/28/18 202 lb 6.4 oz (91.8 kg)   She is not on cholesterol medication and denies myalgias. Her cholesterol is not at goal. The cholesterol last visit was:   Lab Results  Component Value Date   CHOL 131 09/23/2016   HDL 54 09/23/2016   LDLCALC 64 09/23/2016   TRIG 66 09/23/2016   CHOLHDL 2.4 09/23/2016    Last A1C in the office was:  Lab Results  Component Value Date   HGBA1C 4.4 09/23/2016   Last GFR: Lab Results  Component Value Date   GFRNONAA 123 12/19/2017   Patient is on Vitamin D supplement.   Lab Results  Component Value Date   VD25OH 34 04/24/2015      Current Medications:    Current Outpatient Medications (Endocrine & Metabolic):  .  levonorgestrel (MIRENA) 20 MCG/24HR IUD, 1 each by Intrauterine route once.   Current Outpatient Medications (Respiratory):  .  Albuterol Sulfate 108 (90 Base) MCG/ACT AEPB, Inhale into the lungs. .  fluticasone (FLONASE) 50 MCG/ACT nasal spray, Place 2 sprays into both nostrils daily. Marland Kitchen  levalbuterol (XOPENEX HFA) 45 MCG/ACT inhaler, INHALE 2 PUFFS INTO LUNGS EVERY 4 HOURS AS NEEDED FOR WHEEZING .  levocetirizine (XYZAL) 5 MG tablet, TAKE ONE TABLET BY MOUTH EVERY EVENING FOR ALLERGIES .   montelukast (SINGULAIR) 10 MG tablet, Take 1 tablet (10 mg total) by mouth daily. .  SYMBICORT 160-4.5 MCG/ACT inhaler, INHALE TWO PUFFS BY MOUTH TWICE A DAY .  Tiotropium Bromide Monohydrate (SPIRIVA RESPIMAT) 1.25 MCG/ACT AERS, Inhale 2 puffs into the lungs daily. .  Budesonide (PULMICORT FLEXHALER) 90 MCG/ACT inhaler, Take 2 puffs twice a day during upper respiratory infections for 1-2 weeks.    Current Outpatient Medications (Other):  Marland Kitchen  buPROPion (WELLBUTRIN XL) 300 MG 24 hr tablet, Take 300 mg by mouth daily. .  Cholecalciferol (VITAMIN D3) 5000 units CAPS, Take 1 capsule by mouth daily. Marland Kitchen  buPROPion (WELLBUTRIN XL) 150 MG 24 hr tablet, Take 150 mg by mouth daily.  Allergies:  Allergies  Allergen Reactions  . Breo Ellipta [Fluticasone Furoate-Vilanterol] Other (See Comments)    Thrush  . Peanut-Containing Drug Products Other (See Comments)    Tingling and numbness in throat from tree nuts  . Prednisone Other (See Comments)    Elevated temperature  . Penicillins Rash    Augmentin  . Alcohol-Sulfur [Sulfur]   . Benzyl Alcohol Other (See Comments)  . Metronidazole     Other reaction(s): Other (See Comments) Seizures and high temp.  . Toradol [Ketorolac Tromethamine]     Syncope, nausea   Medical History:  She has Obesity (BMI 30.0-34.9); Cyst of skin; History of keloid of skin; Moderate persistent asthma; Seasonal and perennial allergic rhinitis; Gastroesophageal reflux disease; Seasonal allergic conjunctivitis; Chronic migraine without aura without status migrainosus, not intractable; Depression, major, recurrent, in partial remission ([place]); Recurrent infections; Vitamin D deficiency; and Medication management on their problem list.    Health Maintenance:   Immunization History  Administered Date(s) Administered  . DTaP 07/05/1994, 09/12/1994, 11/10/1994, 05/15/1995  . Hepatitis A 08/10/2005, 06/27/2006  . Hepatitis B 1994[birth year]03[birth year]03, 07/05/1994, 11/10/1994  . IPV 07/05/1994,  09/12/1994, 11/10/1994, 04/28/1998  . MMR 05/15/1995, 04/26/1999  . Meningococcal Conjugate 07/06/2015  . Tdap 06/17/2008  . Varicella 05/15/1995, 08/10/2005    Tetanus: 2009 declines at this time Pneumovax: Prevnar 13:  Flu vaccine: get the flu shot Zostavax:  No LMP recorded. (Menstrual status: IUD). Pap: saw last week, Dr. Valentino Saxon.  MGM:  DEXA: Colonoscopy: EGD:  Patient Care Team: Unk Pinto, MD as PCP - General (Internal Medicine)  Surgical History:  She has a past surgical history that includes Intrauterine device insertion; Adenoidectomy; Adenoidectomy; and Breast lumpectomy (Left, 04/2016). Family History:  Herfamily history includes Allergic rhinitis in her father and mother; Breast cancer in her maternal uncle and paternal aunt; Diabetes in her maternal grandmother; Heart disease in her maternal aunt; Migraines in her mother; Parkinsonism in her maternal grandfather; Skin cancer in her maternal aunt. Social History:  She reports that she has never smoked. She has never used smokeless tobacco. She reports that she does not drink alcohol or use drugs.  Review of Systems: Review of Systems  Constitutional: Negative.   HENT: Negative.   Eyes: Negative.   Respiratory: Positive for cough and wheezing. Negative for hemoptysis, sputum production and shortness of breath.   Cardiovascular: Negative.   Gastrointestinal: Negative.   Genitourinary: Negative.  Musculoskeletal: Negative.   Skin: Negative.     Physical Exam: Estimated body mass index is 37.76 kg/m as calculated from the following:   Height as of this encounter: '5\' 4"'  (1.626 m).   Weight as of this encounter: 220 lb (99.8 kg). BP 126/88   Pulse 78   Temp 98 F (36.7 C)   Ht '5\' 4"'  (1.626 m)   Wt 220 lb (99.8 kg)   SpO2 97%   BMI 37.76 kg/m  General Appearance: Well nourished, in no apparent distress.  Eyes: PERRLA, EOMs, conjunctiva no swelling or erythema, normal fundi and vessels.   Sinuses: No Frontal/maxillary tenderness  ENT/Mouth: Ext aud canals clear, normal light reflex with TMs without erythema, bulging. Good dentition. No erythema, swelling, or exudate on post pharynx. Tonsils not swollen or erythematous. Hearing normal.  Neck: Supple, thyroid normal. No bruits  Respiratory: Respiratory effort normal, BS equal bilaterally without rales, rhonchi, wheezing or stridor.  Cardio: RRR without murmurs, rubs or gallops. Brisk peripheral pulses without edema.  Chest: symmetric, with normal excursions and percussion.  Breasts: defer Abdomen: Soft, nontender, no guarding, rebound, hernias, masses, or organomegaly.  Lymphatics: Non tender without lymphadenopathy.  Genitourinary: defer GYN Musculoskeletal: Full ROM all peripheral extremities,5/5 strength, and normal gait.  Skin: Warm, dry without rashes, lesions, ecchymosis. Neuro: Cranial nerves intact, reflexes equal bilaterally. Normal muscle tone, no cerebellar symptoms. Sensation intact.  Psych: Awake and oriented X 3, normal affect, Insight and Judgment appropriate.   EKG: defer AORTA SCAN: defer  Vicie Mutters 11:23 AM [facility]

## 2019-11-01 NOTE — Patient Instructions (Addendum)
Check out the book  Fiber Fuel   Follow up with Dr. Williams Che are a healthy fat that can help you feel full HOWEVER they pack a lot of calories in a small amount. For weight loss, I suggest no more than 1 serving of nuts a day. A handful goes a long way. Here are some examples of the different types of nuts and the suggested serving a day.     General eating tips  What to Avoid . Avoid added sugars o Often added sugar can be found in processed foods such as many condiments, dry cereals, cakes, cookies, chips, crisps, crackers, candies, sweetened drinks, etc.  o Read labels and AVOID/DECREASE use of foods with the following in their ingredient list: Sugar, fructose, high fructose corn syrup, sucrose, glucose, maltose, dextrose, molasses, cane sugar, brown sugar, any type of syrup, agave nectar, etc.   . Avoid snacking in between meals- drink water or if you feel you need a snack, pick a high water content snack such as cucumbers, watermelon, or any veggie.  Marland Kitchen Avoid foods made with flour o If you are going to eat food made with flour, choose those made with whole-grains; and, minimize your consumption as much as is tolerable . Avoid processed foods o These foods are generally stocked in the middle of the grocery store.  o Focus on shopping on the perimeter of the grocery.  What to Include . Vegetables o GREEN LEAFY VEGETABLES: Kale, spinach, mustard greens, collard greens, cabbage, broccoli, etc. o OTHER: Asparagus, cauliflower, eggplant, carrots, peas, Brussel sprouts, tomatoes, bell peppers, zucchini, beets, cucumbers, etc. . Grains, seeds, and legumes o Beans: kidney beans, black eyed peas, garbanzo beans, black beans, pinto beans, etc. o Whole, unrefined grains: brown rice, barley, bulgur, oatmeal, etc. . Healthy fats  o Avoid highly processed fats such as vegetable oil o Examples of healthy fats: avocado, olives, virgin olive oil, dark chocolate (?72% Cocoa), nuts (peanuts,  almonds, walnuts, cashews, pecans, etc.) o Please still do small amount of these healthy fats, they are dense in calories.  . Low - Moderate Intake of Animal Sources of Protein o Meat sources: chicken, [place], salmon, tuna. Limit to 4 ounces of meat at one time or the size of your palm. o Consider limiting dairy sources, but when choosing dairy focus on: PLAIN [place] yogurt, cottage cheese, high-protein milk . Fruit o Choose berries

## 2019-11-02 LAB — URINALYSIS, ROUTINE W REFLEX MICROSCOPIC
Bilirubin Urine: NEGATIVE
Glucose, UA: NEGATIVE
Hgb urine dipstick: NEGATIVE
Ketones, ur: NEGATIVE
Leukocytes,Ua: NEGATIVE
Nitrite: NEGATIVE
Protein, ur: NEGATIVE
Specific Gravity, Urine: 1.005 (ref 1.001–1.03)
pH: 6.5 (ref 5.0–8.0)

## 2019-11-02 LAB — MICROALBUMIN / CREATININE URINE RATIO
Creatinine, Urine: 17 mg/dL — ABNORMAL LOW (ref 20–275)
Microalb, Ur: <0.2 mg/dL

## 2019-11-04 LAB — LIPID PANEL
Cholesterol: 152 mg/dL (ref <200)
HDL: 61 mg/dL (ref >=50)
LDL Cholesterol (Calc): 75 mg/dL (calc)
Non-HDL Cholesterol (Calc): 91 mg/dL (calc) (ref <130)
Total CHOL/HDL Ratio: 2.5 (calc) (ref <5.0)
Triglycerides: 77 mg/dL (ref <150)

## 2019-11-04 LAB — CBC WITH DIFFERENTIAL/PLATELET
Absolute Monocytes: 660 cells/uL (ref 200–950)
Basophils Absolute: 60 cells/uL (ref 0–200)
Basophils Relative: 0.8 %
Eosinophils Absolute: 315 cells/uL (ref 15–500)
Eosinophils Relative: 4.2 %
HCT: 42.8 % (ref 35.0–45.0)
Hemoglobin: 14.6 g/dL (ref 11.7–15.5)
Lymphs Abs: 3390 cells/uL (ref 850–3900)
MCH: 31.7 pg (ref 27.0–33.0)
MCHC: 34.1 g/dL (ref 32.0–36.0)
MCV: 93 fL (ref 80.0–100.0)
MPV: 10.7 fL (ref 7.5–12.5)
Monocytes Relative: 8.8 %
Neutro Abs: 3075 cells/uL (ref 1500–7800)
Neutrophils Relative %: 41 %
Platelets: 324 10*3/uL (ref 140–400)
RBC: 4.6 10*6/uL (ref 3.80–5.10)
RDW: 12 % (ref 11.0–15.0)
Total Lymphocyte: 45.2 %
WBC: 7.5 10*3/uL (ref 3.8–10.8)

## 2019-11-04 LAB — COMPLETE METABOLIC PANEL WITH GFR
AG Ratio: 2.1 (calc) (ref 1.0–2.5)
ALT: 8 U/L (ref 6–29)
AST: 17 U/L (ref 10–30)
Albumin: 4.7 g/dL (ref 3.6–5.1)
Alkaline phosphatase (APISO): 97 U/L (ref 31–125)
BUN: 11 mg/dL (ref 7–25)
CO2: 21 mmol/L (ref 20–32)
Calcium: 9.4 mg/dL (ref 8.6–10.2)
Chloride: 105 mmol/L (ref 98–110)
Creat: 0.67 mg/dL (ref 0.50–1.10)
GFR, Est African American: 142 mL/min/{1.73_m2} (ref >=60)
GFR, Est Non African American: 122 mL/min/{1.73_m2} (ref >=60)
Globulin: 2.2 g/dL (calc) (ref 1.9–3.7)
Glucose, Bld: 86 mg/dL (ref 65–99)
Potassium: 4.1 mmol/L (ref 3.5–5.3)
Sodium: 138 mmol/L (ref 135–146)
Total Bilirubin: 0.9 mg/dL (ref 0.2–1.2)
Total Protein: 6.9 g/dL (ref 6.1–8.1)

## 2019-11-04 LAB — IRON, TOTAL/TOTAL IRON BINDING CAP
%SAT: 39 % (calc) (ref 16–45)
Iron: 134 ug/dL (ref 40–190)
TIBC: 345 mcg/dL (calc) (ref 250–450)

## 2019-11-04 LAB — MAGNESIUM: Magnesium: 2 mg/dL (ref 1.5–2.5)

## 2019-11-04 LAB — HEMOGLOBIN A1C
Hgb A1c MFr Bld: 4.4 % of total Hgb (ref <5.7)
Mean Plasma Glucose: 80 (calc)
eAG (mmol/L): 4.4 (calc)

## 2019-11-04 LAB — VITAMIN D 25 HYDROXY (VIT D DEFICIENCY, FRACTURES): Vit D, 25-Hydroxy: 24 ng/mL — ABNORMAL LOW (ref 30–100)

## 2019-11-04 LAB — TSH: TSH: 3.75 mIU/L

## 2019-11-04 LAB — INSULIN, RANDOM: Insulin: 5.1 u[IU]/mL

## 2019-11-04 LAB — VITAMIN B12: Vitamin B-12: 319 pg/mL (ref 200–1100)

## 2019-11-28 ENCOUNTER — Ambulatory Visit: Payer: BLUE CROSS/BLUE SHIELD | Admitting: Dermatology

## 2020-01-01 ENCOUNTER — Ambulatory Visit: Payer: Self-pay | Admitting: Physician Assistant

## 2020-01-02 ENCOUNTER — Other Ambulatory Visit: Payer: Self-pay | Admitting: Physician Assistant

## 2020-05-11 ENCOUNTER — Telehealth: Payer: Self-pay | Admitting: *Deleted

## 2020-05-11 NOTE — Telephone Encounter (Signed)
Returned a call to patient regarding her visit to the ED at [facility] in [place], [place]. She states her BP was the highest at 145/100, but the next day, t an urgent care center, it was 137/87. She reported having a headache and swelling in her hands and feet, but no RX was given to her at the hospital. She was advised to increase fluid intake, elevate her feet and reduce her sodium intake. Dr Oneta Rack is aware.

## 2020-06-15 ENCOUNTER — Other Ambulatory Visit: Payer: Self-pay

## 2020-06-15 ENCOUNTER — Encounter: Payer: Self-pay | Admitting: Adult Health Nurse Practitioner

## 2020-06-15 ENCOUNTER — Ambulatory Visit (INDEPENDENT_AMBULATORY_CARE_PROVIDER_SITE_OTHER): Payer: 59 | Admitting: Adult Health Nurse Practitioner

## 2020-06-15 VITALS — BP 128/80 | HR 66 | Temp 98.1°F | Wt 217.0 lb

## 2020-06-15 DIAGNOSIS — M26609 Unspecified temporomandibular joint disorder, unspecified side: Secondary | ICD-10-CM | POA: Diagnosis not present

## 2020-06-15 DIAGNOSIS — J302 Other seasonal allergic rhinitis: Secondary | ICD-10-CM

## 2020-06-15 DIAGNOSIS — H8113 Benign paroxysmal vertigo, bilateral: Secondary | ICD-10-CM

## 2020-06-15 DIAGNOSIS — S6710XA Crushing injury of unspecified finger(s), initial encounter: Secondary | ICD-10-CM

## 2020-06-15 DIAGNOSIS — J3089 Other allergic rhinitis: Secondary | ICD-10-CM | POA: Diagnosis not present
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
## 2020-07-20 ENCOUNTER — Other Ambulatory Visit: Payer: Self-pay

## 2020-07-20 DIAGNOSIS — J302 Other seasonal allergic rhinitis: Secondary | ICD-10-CM

## 2020-07-20 DIAGNOSIS — J4540 Moderate persistent asthma, uncomplicated: Secondary | ICD-10-CM

## 2020-07-20 MED ORDER — MONTELUKAST SODIUM 10 MG PO TABS: 10.0000 mg | ORAL_TABLET | Freq: Every day | ORAL | 1 refills | Status: DC

## 2020-09-09 ENCOUNTER — Encounter: Payer: 59 | Admitting: Adult Health

## 2020-09-23 ENCOUNTER — Other Ambulatory Visit: Payer: Self-pay | Admitting: Internal Medicine

## 2020-09-23 MED ORDER — BUDESONIDE-FORMOTEROL FUMARATE 160-4.5 MCG/ACT IN AERO: INHALATION_SPRAY | RESPIRATORY_TRACT | 3 refills | Status: DC

## 2020-09-23 MED ORDER — SPIRIVA RESPIMAT 1.25 MCG/ACT IN AERS: INHALATION_SPRAY | RESPIRATORY_TRACT | 3 refills | Status: DC

## 2020-11-04 ENCOUNTER — Encounter: Payer: 59 | Admitting: Adult Health

## 2020-12-25 ENCOUNTER — Other Ambulatory Visit: Payer: Self-pay | Admitting: Adult Health

## 2020-12-28 ENCOUNTER — Encounter: Payer: Self-pay | Admitting: Adult Health

## 2020-12-28 ENCOUNTER — Other Ambulatory Visit: Payer: Self-pay

## 2020-12-28 ENCOUNTER — Ambulatory Visit (INDEPENDENT_AMBULATORY_CARE_PROVIDER_SITE_OTHER): Payer: 59 | Admitting: Adult Health

## 2020-12-28 ENCOUNTER — Ambulatory Visit
Admission: RE | Admit: 2020-12-28 | Discharge: 2020-12-28 | Disposition: A | Discharge status: Home / Self Care | Payer: 59 | Source: Ambulatory Visit | Attending: Adult Health | Admitting: Adult Health

## 2020-12-28 VITALS — BP 110/74 | HR 89 | Temp 97.3°F | Wt 218.0 lb

## 2020-12-28 DIAGNOSIS — B839 Helminthiasis, unspecified: Secondary | ICD-10-CM

## 2020-12-28 DIAGNOSIS — R195 Other fecal abnormalities: Secondary | ICD-10-CM

## 2020-12-28 DIAGNOSIS — R194 Change in bowel habit: Secondary | ICD-10-CM | POA: Diagnosis not present

## 2020-12-28 DIAGNOSIS — M542 Cervicalgia: Secondary | ICD-10-CM | POA: Diagnosis not present
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
## 2020-12-29 ENCOUNTER — Ambulatory Visit: Payer: 59 | Admitting: Nurse Practitioner

## 2021-01-01 ENCOUNTER — Encounter: Payer: 59 | Admitting: Adult Health

## 2021-01-04 LAB — OVA AND PARASITE EXAMINATION
CONCENTRATE RESULT:: NONE SEEN
MICRO NUMBER:: 12115088
SPECIMEN QUALITY:: ADEQUATE
TRICHROME RESULT:: NONE SEEN

## 2021-01-13 ENCOUNTER — Other Ambulatory Visit: Payer: Self-pay | Admitting: Adult Health Nurse Practitioner

## 2021-01-13 DIAGNOSIS — J4540 Moderate persistent asthma, uncomplicated: Secondary | ICD-10-CM

## 2021-01-13 DIAGNOSIS — J302 Other seasonal allergic rhinitis: Secondary | ICD-10-CM

## 2021-02-26 NOTE — Progress Notes (Deleted)
Complete Physical  Assessment and Plan:  Encounter for Annual Physical Exam with abnormal findings Due annually  Health Maintenance reviewed Healthy lifestyle reviewed and goals set   Medication management -     CBC with Differential/Platelet -     COMPLETE METABOLIC PANEL WITH GFR -     Magnesium  Vitamin D deficiency -     VITAMIN D 25 Hydroxy (Vit-D Deficiency, Fractures)  Obesity *** - follow up 3 months for progress monitoring - increase veggies, decrease carbs - long discussion about weight loss, diet, and exercise  Depression, major, recurrent, in partial remission (HCC) *** -     TSH  Chronic migraine without aura without status migrainosus, not intractable Controlled , avoid triggers.  Moderate persistent asthma, unspecified whether complicated ***  Seasonal and perennial allergic rhinitis -     Fluticasone Propionate (XHANCE) 93 MCG/ACT EXHU; Place 2 Act into the nose at bedtime.  Gastroesophageal reflux disease, esophagitis presence not specified Continue PPI/H2 blocker, diet discussed  Screening for hematuria or proteinuria -     Urinalysis, Routine w reflex microscopic -     Microalbumin / creatinine urine ratio  No orders of the defined types were placed in this encounter.   Discussed med's effects and SE's. Screening labs and tests as requested with regular follow-up as recommended. Over 40 minutes of exam, counseling, chart review, and complex, high level critical decision making was performed this visit.   Future Appointments  Date Time Provider [place]  03/02/2021 10:00 AM Liane Comber, NP [facility] None     HPI  25 y.o. female  presents for a complete physical and follow up for has Obesity (BMI 30.0-34.9); Cyst of skin; History of keloid of skin; Moderate persistent asthma; Seasonal and perennial allergic rhinitis; Gastroesophageal reflux disease; Chronic migraine without aura without status migrainosus, not intractable;  Depression, major, recurrent, in partial remission ([place]); Vitamin D deficiency; and Medication management on their problem list..  GYN, on mirena ***  She has history of asthma/allergies, follows with Dr. Maudie Mercury; *** she is on singulair, xyzal, flonase, saline spray, xopenex, symbicort and spiriva.   She has recurrent depression, currently on wellbutrin   BMI is There is no height or weight on file to calculate BMI., she has been doing orange theory 4 x a week, 1500 calories on not work out days and 1750 calories.  Yogurt, avocado toast, lunch cauliflower wraps, tortilla wraps, and dinner is salad. Fruit. Nuts. Wt Readings from Last 3 Encounters:  12/28/20 218 lb (98.9 kg)  06/15/20 217 lb (98.4 kg)  11/01/19 220 lb (99.8 kg)   Her blood pressure {HAS HAS NOT:18834} been controlled at home, today their BP is    She {DOES_DOES LTJ:03009} workout. She denies chest pain, shortness of breath, dizziness.  She is not on cholesterol medication and denies myalgias. Her cholesterol is at goal. The cholesterol last visit was:   Lab Results  Component Value Date   CHOL 152 11/01/2019   HDL 61 11/01/2019   LDLCALC 75 11/01/2019   TRIG 77 11/01/2019   CHOLHDL 2.5 11/01/2019    Last A1C in the office was:  Lab Results  Component Value Date   HGBA1C 4.4 11/01/2019   Last GFR: Lab Results  Component Value Date   GFRNONAA 122 11/01/2019   Patient is on Vitamin D supplement.   Lab Results  Component Value Date   VD25OH 24 (L) 11/01/2019        Lab Results  Component Value Date  VITAMINB12 319 11/01/2019   Lab Results  Component Value Date   IRON 134 11/01/2019   TIBC 345 11/01/2019   FERRITIN 78 03/19/2015     Current Medications:    Current Outpatient Medications (Endocrine & Metabolic):    levonorgestrel (MIRENA) 20 MCG/24HR IUD, 1 each by Intrauterine route once.   Current Outpatient Medications (Respiratory):    Albuterol Sulfate 108 (90 Base) MCG/ACT AEPB, Inhale  into the lungs.   budesonide-formoterol (SYMBICORT) 160-4.5 MCG/ACT inhaler, Use 2 Inhalations (15 minutes apart)  2 x/day (every 12 hours)   cetirizine (ZYRTEC) 10 MG tablet, Take 10 mg by mouth daily.   fluticasone (FLONASE) 50 MCG/ACT nasal spray, Place 2 sprays into both nostrils daily. (Patient not taking: Reported on 12/28/2020)   levalbuterol (XOPENEX HFA) 45 MCG/ACT inhaler, INHALE 2 PUFFS INTO LUNGS EVERY 4 HOURS AS NEEDED FOR WHEEZING   montelukast (SINGULAIR) 10 MG tablet, TAKE 1 TABLET BY MOUTH EVERY DAY   Tiotropium Bromide Monohydrate (SPIRIVA RESPIMAT) 1.25 MCG/ACT AERS, Use  2 Inhalations  15 minutes Apart   Daily    Current Outpatient Medications (Other):    buPROPion (WELLBUTRIN XL) 300 MG 24 hr tablet, Take 300 mg by mouth daily.   Cholecalciferol (VITAMIN D3) 5000 units CAPS, Take 1 capsule by mouth daily. (Patient not taking: Reported on 12/28/2020)  Allergies:  Allergies  Allergen Reactions   Breo Ellipta [Fluticasone Furoate-Vilanterol] Other (See Comments)    Thrush   Peanut-Containing Drug Products Other (See Comments)    Tingling and numbness in throat from tree nuts   Prednisone Other (See Comments)    Elevated temperature   Penicillins Rash    Augmentin   Alcohol-Sulfur [Elemental Sulfur]    Benzyl Alcohol Other (See Comments)   Metronidazole     Other reaction(s): Other (See Comments) Seizures and high temp.   Toradol [Ketorolac Tromethamine]     Syncope, nausea   Medical History:  She has Obesity (BMI 30.0-34.9); Cyst of skin; History of keloid of skin; Moderate persistent asthma; Seasonal and perennial allergic rhinitis; Gastroesophageal reflux disease; Chronic migraine without aura without status migrainosus, not intractable; Depression, major, recurrent, in partial remission ([place]); Vitamin D deficiency; and Medication management on their problem list.    Health Maintenance:   Immunization History  Administered Date(s) Administered   DTaP  07/05/1994, 09/12/1994, 11/10/1994, 05/15/1995   Hepatitis A 08/10/2005, 06/27/2006   Hepatitis B 07-07-1993, 07/05/1994, 11/10/1994   IPV 07/05/1994, 09/12/1994, 11/10/1994, 04/28/1998   MMR 05/15/1995, 04/26/1999   Meningococcal Conjugate 07/06/2015   Tdap 06/17/2008   Varicella 05/15/1995, 08/10/2005    Tetanus: 2009 declines at this time *** Pneumovax: n/a Flu vaccine: get the flu shot HPV: *** Covid 19: ***  No LMP recorded. Pap: saw last week, Dr. Valentino Saxon.  MGM:   Colonoscopy: EGD:  Vision: Dental: Derm: ***  Patient Care Team: Unk Pinto, MD as PCP - General (Internal Medicine)  Surgical History:  She has a past surgical history that includes Intrauterine device insertion; Adenoidectomy; Adenoidectomy; and Breast lumpectomy (Left, 04/2016). Family History:  Herfamily history includes Allergic rhinitis in her father and mother; Breast cancer in her maternal uncle and paternal aunt; Diabetes in her maternal grandmother; Heart disease in her maternal aunt; Migraines in her mother; Parkinsonism in her maternal grandfather; Skin cancer in her maternal aunt. Social History:  She reports that she has never smoked. She has never used smokeless tobacco. She reports that she does not drink alcohol and does not use drugs.  Review of  Systems: Review of Systems  Constitutional: Negative.  Negative for malaise/fatigue and weight loss.  HENT: Negative.  Negative for hearing loss and tinnitus.   Eyes: Negative.  Negative for blurred vision and double vision.  Respiratory:  Negative for cough, hemoptysis, sputum production, shortness of breath and wheezing.   Cardiovascular: Negative.  Negative for chest pain, palpitations, orthopnea, claudication and leg swelling.  Gastrointestinal: Negative.  Negative for abdominal pain, blood in stool, constipation, diarrhea, heartburn, melena, nausea and vomiting.  Genitourinary: Negative.   Musculoskeletal: Negative.  Negative for  joint pain and myalgias.  Skin: Negative.  Negative for rash.  Neurological:  Negative for dizziness, tingling, sensory change, weakness and headaches.  Endo/Heme/Allergies:  Positive for environmental allergies. Negative for polydipsia.  Psychiatric/Behavioral: Negative.    All other systems reviewed and are negative.  Physical Exam: Estimated body mass index is 37.42 kg/m as calculated from the following:   Height as of 11/01/19: '5\' 4"'  (1.626 m).   Weight as of 12/28/20: 218 lb (98.9 kg). There were no vitals taken for this visit. General Appearance: Well nourished, in no apparent distress.  Eyes: PERRLA, EOMs, conjunctiva no swelling or erythema, normal fundi and vessels.  Sinuses: No Frontal/maxillary tenderness  ENT/Mouth: Ext aud canals clear, normal light reflex with TMs without erythema, bulging. Good dentition. No erythema, swelling, or exudate on post pharynx. Tonsils not swollen or erythematous. Hearing normal.  Neck: Supple, thyroid normal. No bruits  Respiratory: Respiratory effort normal, BS equal bilaterally without rales, rhonchi, wheezing or stridor.  Cardio: RRR without murmurs, rubs or gallops. Brisk peripheral pulses without edema.  Chest: symmetric, with normal excursions and percussion.  Breasts: defer Abdomen: Soft, nontender, no guarding, rebound, hernias, masses, or organomegaly.  Lymphatics: Non tender without lymphadenopathy.  Genitourinary: defer GYN Musculoskeletal: Full ROM all peripheral extremities,5/5 strength, and normal gait.  Skin: Warm, dry without rashes, lesions, ecchymosis. Neuro: Cranial nerves intact, reflexes equal bilaterally. Normal muscle tone, no cerebellar symptoms. Sensation intact.  Psych: Awake and oriented X 3, normal affect, Insight and Judgment appropriate.   EKG: defer AORTA SCAN: defer  Izora Ribas 8:28 AM [facility]

## 2021-03-01 ENCOUNTER — Encounter: Payer: 59 | Admitting: Adult Health

## 2021-03-02 ENCOUNTER — Encounter: Payer: 59 | Admitting: Adult Health

## 2021-03-02 DIAGNOSIS — Z79899 Other long term (current) drug therapy: Secondary | ICD-10-CM

## 2021-03-02 DIAGNOSIS — G43709 Chronic migraine without aura, not intractable, without status migrainosus: Secondary | ICD-10-CM

## 2021-03-02 DIAGNOSIS — Z0000 Encounter for general adult medical examination without abnormal findings: Secondary | ICD-10-CM

## 2021-03-02 DIAGNOSIS — E559 Vitamin D deficiency, unspecified: Secondary | ICD-10-CM

## 2021-03-02 DIAGNOSIS — J302 Other seasonal allergic rhinitis: Secondary | ICD-10-CM

## 2021-03-02 DIAGNOSIS — F3341 Major depressive disorder, recurrent, in partial remission: Secondary | ICD-10-CM

## 2021-03-02 DIAGNOSIS — E669 Obesity, unspecified: Secondary | ICD-10-CM

## 2021-03-02 DIAGNOSIS — K219 Gastro-esophageal reflux disease without esophagitis: Secondary | ICD-10-CM

## 2021-03-02 DIAGNOSIS — J4540 Moderate persistent asthma, uncomplicated: Secondary | ICD-10-CM

## 2021-03-06 ENCOUNTER — Other Ambulatory Visit: Payer: Self-pay | Admitting: Internal Medicine

## 2021-03-06 DIAGNOSIS — B354 Tinea corporis: Secondary | ICD-10-CM

## 2021-03-06 MED ORDER — KETOCONAZOLE 2 % EX CREA: 1.0000 "application " | TOPICAL_CREAM | Freq: Every day | CUTANEOUS | 1 refills | Status: AC

## 2021-03-08 ENCOUNTER — Encounter: Payer: Self-pay | Admitting: Nurse Practitioner

## 2021-03-08 ENCOUNTER — Ambulatory Visit (INDEPENDENT_AMBULATORY_CARE_PROVIDER_SITE_OTHER): Payer: 59 | Admitting: Nurse Practitioner

## 2021-03-08 ENCOUNTER — Other Ambulatory Visit: Payer: Self-pay

## 2021-03-08 VITALS — BP 122/80 | HR 100 | Temp 97.5°F | Wt 214.4 lb

## 2021-03-08 DIAGNOSIS — E872 Acidosis: Secondary | ICD-10-CM

## 2021-03-08 DIAGNOSIS — D649 Anemia, unspecified: Secondary | ICD-10-CM | POA: Diagnosis not present

## 2021-03-08 DIAGNOSIS — R8290 Unspecified abnormal findings in urine: Secondary | ICD-10-CM

## 2021-03-08 DIAGNOSIS — J3489 Other specified disorders of nose and nasal sinuses: Secondary | ICD-10-CM

## 2021-03-08 DIAGNOSIS — R197 Diarrhea, unspecified: Secondary | ICD-10-CM | POA: Diagnosis not present

## 2021-03-08 DIAGNOSIS — R7309 Other abnormal glucose: Secondary | ICD-10-CM | POA: Diagnosis not present

## 2021-03-08 DIAGNOSIS — E8729 Other acidosis: Secondary | ICD-10-CM

## 2021-03-08 NOTE — Progress Notes (Signed)
Assessment and Plan: Lori West was seen today for follow-up.  Diagnoses and all orders for this visit:  Diarrhea, unspecified type -     Stool culture; Future  Pt to monitor symptoms, states she had less episodes today but is very concerned due to past history of C. Diff x 2.  Metabolic acidosis, increased anion gap -     COMPLETE METABOLIC PANEL WITH GFR - Noted on ER visit. Pt states she was possibly dehydrated and labs were drawn while she was passing out.  Abnormal glucose -     Hemoglobin A1c -     Insulin, random - Given information on hypoglycemia and eating healthy meals regularly  Anemia, unspecified type -     CBC with Differential/Platelet -     Iron, Total/Total Iron Binding Cap -     Ferritin -     Vitamin B12 -     Vitamin B6  Urine abnormality -     Urine Culture -     Microalbumin / creatinine urine ratio -     Urinalysis, Routine w reflex microscopic    Dry Nares  Advised pt to use humidifier and saline nasal spray for moisturizing. This is most likely the cause of red blood coughed up this AM, pt to monitor symptoms and if persists call the office.    Further disposition pending results of labs. Discussed med's effects and SE's.   Over 30 minutes of exam, counseling, chart review, and critical decision making was performed.   No future appointments.   ------------------------------------------------------------------------------------------------------------------   HPI BP 122/80   Pulse 100   Temp (!) 97.5 F (36.4 C)   Wt 214 lb 6.4 oz (97.3 kg)   SpO2 97%   BMI 36.80 kg/m  25 y.o.female presents for ED follow up Had dizziness for brief periods for several weeks, passed out several times. Denies associated nausea, sweating.    She is having periods of loose stools 3-4 times a day without mucus or diarrhea. She has a history of C. Diff x 2 in the past.   She was having reflux the past  2 weeks and was taking famotidine. She coughed up bright  red blood this AM.  Denies nose bleeds. States she usually uses a humidifier but it is in her house in [place] she is selling.   Pt is currently under a lot of stress, she is selling a house in [place] and buying a house here.  She has not been drinking many liquids.  Has been dieting but states she has been eating regularly.    Pt went to ED at [facility]  03/07/21 .No CT evidence of acute intracranial abnormality, specifically no acute hemorrhage or large vessel territory ischemia.  Normal EKG. Pt did not stay to be seen by physician, was complaining of dizziness  Past Medical History:  Diagnosis Date   Anxiety    Asthma    C. difficile diarrhea    Depression    Gastritis    Gastropathy    mild reactive   Headache    IBS (irritable bowel syndrome)    Obesity    Vitamin D deficiency      Allergies  Allergen Reactions   Breo Ellipta [Fluticasone Furoate-Vilanterol] Other (See Comments)    Thrush   Peanut-Containing Drug Products Other (See Comments)    Tingling and numbness in throat from tree nuts   Prednisone Other (See Comments)    Elevated temperature   Penicillins Rash  Augmentin   Alcohol-Sulfur [Elemental Sulfur]    Benzyl Alcohol Other (See Comments)   Metronidazole     Other reaction(s): Other (See Comments) Seizures and high temp.   Toradol [Ketorolac Tromethamine]     Syncope, nausea    Current Outpatient Medications on File Prior to Visit  Medication Sig   Albuterol Sulfate 108 (90 Base) MCG/ACT AEPB Inhale into the lungs.   budesonide-formoterol (SYMBICORT) 160-4.5 MCG/ACT inhaler Use 2 Inhalations (15 minutes apart)  2 x/day (every 12 hours)   buPROPion (WELLBUTRIN XL) 300 MG 24 hr tablet Take 300 mg by mouth daily.   cetirizine (ZYRTEC) 10 MG tablet Take 10 mg by mouth daily.   levalbuterol (XOPENEX HFA) 45 MCG/ACT inhaler INHALE 2 PUFFS INTO LUNGS EVERY 4 HOURS AS NEEDED FOR WHEEZING   montelukast (SINGULAIR) 10 MG tablet TAKE 1 TABLET BY MOUTH  EVERY DAY   Tiotropium Bromide Monohydrate (SPIRIVA RESPIMAT) 1.25 MCG/ACT AERS Use  2 Inhalations  15 minutes Apart   Daily   ketoconazole (NIZORAL) 2 % cream Apply 1 application topically daily. Apply to Ringworm rash 2 x /day til cleared   No current facility-administered medications on file prior to visit.    Review of Systems  Constitutional:  Positive for malaise/fatigue. Negative for chills and fever.  HENT:  Positive for congestion and sore throat. Negative for hearing loss and tinnitus.   Eyes:  Negative for blurred vision and double vision.  Respiratory:  Positive for hemoptysis (1 time today coughed up bright red blood). Negative for cough, shortness of breath and wheezing.   Cardiovascular:  Negative for chest pain, palpitations and leg swelling.  Gastrointestinal:  Positive for abdominal pain, diarrhea and heartburn. Negative for nausea and vomiting.  Genitourinary:  Positive for frequency. Negative for dysuria, flank pain and urgency.  Musculoskeletal:  Positive for back pain (right and left lower back).  Skin:  Positive for rash (ringworm on right upper arm being treated).  Neurological:  Positive for dizziness and loss of consciousness (several fainting spells in past 2 weeks). Negative for tremors.  Psychiatric/Behavioral:  Negative for depression. The patient is nervous/anxious.   .   Physical Exam:  BP 122/80   Pulse 100   Temp (!) 97.5 F (36.4 C)   Wt 214 lb 6.4 oz (97.3 kg)   SpO2 97%   BMI 36.80 kg/m   General Appearance: Well nourished, in no apparent distress. Eyes: PERRLA, EOMs, conjunctiva no swelling or erythema Sinuses: No Frontal/maxillary tenderness ENT/Mouth: Ext aud canals clear, TMs without erythema, bulging. No erythema, swelling, or exudate on post pharynx.  Tonsils not swollen or erythematous. Hearing normal. Nares are red, no frank bleeding Neck: Supple, thyroid normal.  Respiratory: Respiratory effort normal, BS equal bilaterally without  rales, rhonchi, wheezing or stridor.  Cardio: RRR with no MRGs. Brisk peripheral pulses without edema.  Abdomen: Soft, + BS.   No guarding, rebound, hernias, masses. Mild tenderness RLQ, no rebound tenderness Lymphatics: Non tender without lymphadenopathy.  Musculoskeletal: Full ROM, 5/5 strength, normal gait.  Skin: Warm, dry without rashes, lesions, ecchymosis.  Neuro: Cranial nerves intact. Normal muscle tone, no cerebellar symptoms. Sensation intact.  Psych: Awake and oriented X 3, normal affect, Insight and Judgment appropriate.     [name], NP 4:40 PM [facility] [place] [facility]

## 2021-03-08 NOTE — Patient Instructions (Signed)
Hypoglycemia Hypoglycemia occurs when the level of sugar (glucose) in the blood is too low. Hypoglycemia can happen in people who have or do not have diabetes. It can develop quickly, and it can be a medical emergency. For most people, a blood glucose level below 70 mg/dL (3.9 mmol/L) is considered hypoglycemia. Glucose is a type of sugar that provides the body's main source of energy. Certain hormones (insulin and glucagon) control the level of glucose in the blood. Insulin lowers blood glucose, and glucagon raises blood glucose. Hypoglycemia can result from having too much insulin in the bloodstream, or from not eating enough food that contains glucose. You may also have reactive hypoglycemia, which happens within 4 hours after eating a meal. What are the causes? Hypoglycemia occurs most often in people who have diabetes and may be caused by: Diabetes medicine. Not eating enough, or not eating often enough. Increased physical activity. Drinking alcohol on an empty stomach. If you do not have diabetes, hypoglycemia may be caused by: A tumor in the pancreas. Not eating enough, or not eating for long periods at a time (fasting). A severe infection or illness. Problems after having bariatric surgery. Organ failure, such as kidney or liver failure. Certain medicines. What increases the risk? Hypoglycemia is more likely to develop in people who: Have diabetes and take medicines to lower blood glucose. Abuse alcohol. Have a severe illness. What are the signs or symptoms? Symptoms vary depending on whether the condition is mild, moderate, or severe. Mild hypoglycemia Hunger. Sweating and feeling clammy. Dizziness or feeling light-headed. Sleepiness or restless sleep. Nausea. Increased heart rate. Headache. Blurry vision. Mood changes, such as irritability or anxiety. Tingling or numbness around the mouth, lips, or tongue. Moderate hypoglycemia Confusion and poor judgment. Behavior  changes. Weakness. Irregular heartbeat. A change in coordination. Severe hypoglycemia Severe hypoglycemia is a medical emergency. It can cause: Fainting. Seizures. Loss of consciousness (coma). Death. How is this diagnosed? Hypoglycemia is diagnosed with a blood test to measure your blood glucose level. This blood test is done while you are having symptoms. Your health care provider may also do a physical exam and review your medical history. How is this treated? This condition can be treated by immediately eating or drinking something that contains sugar with 15 grams of fast-acting carbohydrate, such as: 4 oz (120 mL) of fruit juice. 4 oz (120 mL) of regular soda (not diet soda). Several pieces of hard candy. Check food labels to find out how many pieces to eat for 15 grams. 1 Tbsp (15 mL) of sugar or honey. 4 glucose tablets. 1 tube of glucose gel. Treating hypoglycemia if you have diabetes If you are alert and able to swallow safely, follow the 15:15 rule: Take 15 grams of a fast-acting carbohydrate. Talk with your health care provider about how much you should take. Options for getting 15 grams of fast-acting carbohydrate include: Glucose tablets (take 4 tablets). Several pieces of hard candy. Check food labels to find out how many pieces to eat for 15 grams. 4 oz (120 mL) of fruit juice. 4 oz (120 mL) of regular soda (not diet soda). 1 Tbsp (15 mL) of sugar or honey. 1 tube of glucose gel. Check your blood glucose 15 minutes after you take the carbohydrate. If the repeat blood glucose level is still at or below 70 mg/dL (3.9 mmol/L), take 15 grams of a carbohydrate again. If your blood glucose level does not increase above 70 mg/dL (3.9 mmol/L) after 3 tries, seek emergency  medical care. After your blood glucose level returns to normal, eat a meal or a snack within 1 hour.  Treating severe hypoglycemia Severe hypoglycemia is when your blood glucose level is below 54 mg/dL (3  mmol/L). Severe hypoglycemia is a medical emergency. Get medical help right away. If you have severe hypoglycemia and you cannot eat or drink, you will need to be given glucagon. A family member or close friend should learn how to check your blood glucose and how to give you glucagon. Ask your health care provider if you need to have an emergency glucagon kit available. Severe hypoglycemia may need to be treated in a hospital. The treatment may include getting glucose through an IV. You may also need treatment for the cause of your hypoglycemia. Follow these instructions at home: General instructions Take over-the-counter and prescription medicines only as told by your health care provider. Monitor your blood glucose as told by your health care provider. If you drink alcohol: Limit how much you have to: 0-1 drink a day for women who are not pregnant. 0-2 drinks a day for men. Know how much alcohol is in your drink. In the [place] one drink equals one 12 oz bottle of beer (355 mL), one 5 oz glass of wine (148 mL), or one 1 oz glass of hard liquor (44 mL). Be sure to eat food along with drinking alcohol. Be aware that alcohol is absorbed quickly and may have lingering effects that may result in hypoglycemia later. Be sure to do ongoing glucose monitoring. Keep all follow-up visits. This is important. If you have diabetes: Always have a fast-acting carbohydrate (15 grams) option with you to treat low blood glucose. Follow your diabetes management plan as directed by your health care provider. Make sure you: Know the symptoms of hypoglycemia. It is important to treat it right away to prevent it from becoming severe. Check your blood glucose as often as told. Always check before and after exercise. Always check your blood glucose before you drive a motorized vehicle. Take your medicines as told. Follow your meal plan. Eat on time, and do not skip meals. Share your diabetes management plan with  people in your workplace, school, and household. Carry a medical alert card or wear medical alert jewelry. Where to find more information American Diabetes Association: www.diabetes.org Contact a health care provider if: You have problems keeping your blood glucose in your target range. You have frequent episodes of hypoglycemia. Get help right away if: You continue to have hypoglycemia symptoms after eating or drinking something that contains 15 grams of fast-acting carbohydrate, and you cannot get your blood glucose above 70 mg/dL (3.9 mmol/L) while following the 15:15 rule. Your blood glucose is below 54 mg/dL (3 mmol/L). You have a seizure. You faint. These symptoms may represent a serious problem that is an emergency. Do not wait to see if the symptoms will go away. Get medical help right away. Call your local emergency services (911 in the [place] Do not drive yourself to the hospital. Summary Hypoglycemia occurs when the level of sugar (glucose) in the blood is too low. Hypoglycemia can happen in people who have or do not have diabetes. It can develop quickly, and it can be a medical emergency. Make sure you know the symptoms of hypoglycemia and how to treat it. Always have a fast-acting carbohydrate option with you to treat low blood sugar. This information is not intended to replace advice given to you by your health care provider. Make   sure you discuss any questions you have with your health care provider. Document Revised: 05/07/2020 Document Reviewed: 05/07/2020 Elsevier Patient Education  2022 [employer].

## 2021-03-10 ENCOUNTER — Other Ambulatory Visit: Payer: Self-pay | Admitting: Nurse Practitioner

## 2021-03-10 DIAGNOSIS — R197 Diarrhea, unspecified: Secondary | ICD-10-CM

## 2021-03-12 LAB — URINALYSIS, ROUTINE W REFLEX MICROSCOPIC
Bilirubin Urine: NEGATIVE
Glucose, UA: NEGATIVE
Hgb urine dipstick: NEGATIVE
Ketones, ur: NEGATIVE
Leukocytes,Ua: NEGATIVE
Nitrite: NEGATIVE
Protein, ur: NEGATIVE
Specific Gravity, Urine: 1.007 (ref 1.001–1.035)
pH: 6.5 (ref 5.0–8.0)

## 2021-03-12 LAB — CBC WITH DIFFERENTIAL/PLATELET
Absolute Monocytes: 738 cells/uL (ref 200–950)
Basophils Absolute: 52 cells/uL (ref 0–200)
Basophils Relative: 0.5 %
Eosinophils Absolute: 166 cells/uL (ref 15–500)
Eosinophils Relative: 1.6 %
HCT: 40.9 % (ref 35.0–45.0)
Hemoglobin: 13.9 g/dL (ref 11.7–15.5)
Lymphs Abs: 3047 cells/uL (ref 850–3900)
MCH: 31 pg (ref 27.0–33.0)
MCHC: 34 g/dL (ref 32.0–36.0)
MCV: 91.3 fL (ref 80.0–100.0)
MPV: 10.1 fL (ref 7.5–12.5)
Monocytes Relative: 7.1 %
Neutro Abs: 6396 cells/uL (ref 1500–7800)
Neutrophils Relative %: 61.5 %
Platelets: 363 10*3/uL (ref 140–400)
RBC: 4.48 10*6/uL (ref 3.80–5.10)
RDW: 12.6 % (ref 11.0–15.0)
Total Lymphocyte: 29.3 %
WBC: 10.4 10*3/uL (ref 3.8–10.8)

## 2021-03-12 LAB — COMPLETE METABOLIC PANEL WITH GFR
AG Ratio: 1.9 (calc) (ref 1.0–2.5)
ALT: 7 U/L (ref 6–29)
AST: 14 U/L (ref 10–30)
Albumin: 5.4 g/dL — ABNORMAL HIGH (ref 3.6–5.1)
Alkaline phosphatase (APISO): 106 U/L (ref 31–125)
BUN: 14 mg/dL (ref 7–25)
CO2: 26 mmol/L (ref 20–32)
Calcium: 10.7 mg/dL — ABNORMAL HIGH (ref 8.6–10.2)
Chloride: 105 mmol/L (ref 98–110)
Creat: 0.71 mg/dL (ref 0.50–0.96)
Globulin: 2.8 g/dL (calc) (ref 1.9–3.7)
Glucose, Bld: 85 mg/dL (ref 65–99)
Potassium: 4 mmol/L (ref 3.5–5.3)
Sodium: 142 mmol/L (ref 135–146)
Total Bilirubin: 0.6 mg/dL (ref 0.2–1.2)
Total Protein: 8.2 g/dL — ABNORMAL HIGH (ref 6.1–8.1)
eGFR: 120 mL/min/{1.73_m2} (ref >=60)

## 2021-03-12 LAB — MICROALBUMIN / CREATININE URINE RATIO
Creatinine, Urine: 34 mg/dL (ref 20–275)
Microalb, Ur: <0.2 mg/dL

## 2021-03-12 LAB — HEMOGLOBIN A1C
Hgb A1c MFr Bld: 4.7 % of total Hgb (ref <5.7)
Mean Plasma Glucose: 88 mg/dL
eAG (mmol/L): 4.9 mmol/L

## 2021-03-12 LAB — IRON, TOTAL/TOTAL IRON BINDING CAP
%SAT: 22 % (calc) (ref 16–45)
Iron: 82 ug/dL (ref 40–190)
TIBC: 371 mcg/dL (calc) (ref 250–450)

## 2021-03-12 LAB — VITAMIN B12: Vitamin B-12: 318 pg/mL (ref 200–1100)

## 2021-03-12 LAB — URINE CULTURE
MICRO NUMBER:: 12392478
Result:: NO GROWTH
SPECIMEN QUALITY:: ADEQUATE

## 2021-03-12 LAB — VITAMIN B6: Vitamin B6: 10 ng/mL (ref 2.1–21.7)

## 2021-03-12 LAB — FERRITIN: Ferritin: 64 ng/mL (ref 16–154)

## 2021-06-18 ENCOUNTER — Other Ambulatory Visit: Payer: Self-pay | Admitting: Adult Health

## 2021-06-18 ENCOUNTER — Encounter: Payer: Self-pay | Admitting: Adult Health

## 2021-06-18 DIAGNOSIS — J302 Other seasonal allergic rhinitis: Secondary | ICD-10-CM

## 2021-06-18 DIAGNOSIS — J4540 Moderate persistent asthma, uncomplicated: Secondary | ICD-10-CM

## 2021-06-19 ENCOUNTER — Other Ambulatory Visit: Payer: Self-pay | Admitting: Adult Health

## 2021-06-19 DIAGNOSIS — J4540 Moderate persistent asthma, uncomplicated: Secondary | ICD-10-CM

## 2021-06-19 DIAGNOSIS — J302 Other seasonal allergic rhinitis: Secondary | ICD-10-CM

## 2021-06-19 MED ORDER — LEVALBUTEROL TARTRATE 45 MCG/ACT IN AERO: INHALATION_SPRAY | RESPIRATORY_TRACT | 2 refills | Status: AC

## 2021-06-19 MED ORDER — MONTELUKAST SODIUM 10 MG PO TABS: 10.0000 mg | ORAL_TABLET | Freq: Every day | ORAL | 3 refills | Status: DC

## 2021-07-20 NOTE — Progress Notes (Deleted)
Assessment and Plan:  There are no diagnoses linked to this encounter.    Further disposition pending results of labs. Discussed med's effects and SE's.   Over 30 minutes of exam, counseling, chart review, and critical decision making was performed.   Future Appointments  Date Time Provider Department Center  07/22/2021 10:30 AM Lori Humphrey, NP [facility] None    ------------------------------------------------------------------------------------------------------------------   HPI There were no vitals taken for this visit. 27 y.o.female presents for  Past Medical History:  Diagnosis Date   Anxiety    Asthma    C. difficile diarrhea    Depression    Gastritis    Gastropathy    mild reactive   Headache    IBS (irritable bowel syndrome)    Obesity    Vitamin D deficiency      Allergies  Allergen Reactions   Breo Ellipta [Fluticasone Furoate-Vilanterol] Other (See Comments)    Thrush   Peanut-Containing Drug Products Other (See Comments)    Tingling and numbness in throat from tree nuts   Prednisone Other (See Comments)    Elevated temperature   Penicillins Rash    Augmentin   Alcohol-Sulfur [Elemental Sulfur]    Benzyl Alcohol Other (See Comments)   Metronidazole     Other reaction(s): Other (See Comments) Seizures and high temp.   Toradol [Ketorolac Tromethamine]     Syncope, nausea    Current Outpatient Medications on File Prior to Visit  Medication Sig   Albuterol Sulfate 108 (90 Base) MCG/ACT AEPB Inhale into the lungs.   budesonide-formoterol (SYMBICORT) 160-4.5 MCG/ACT inhaler Use 2 Inhalations (15 minutes apart)  2 x/day (every 12 hours)   buPROPion (WELLBUTRIN XL) 300 MG 24 hr tablet Take 300 mg by mouth daily.   cetirizine (ZYRTEC) 10 MG tablet Take 10 mg by mouth daily.   ketoconazole (NIZORAL) 2 % cream Apply 1 application topically daily. Apply to Ringworm rash 2 x /day til cleared   levalbuterol (XOPENEX HFA) 45 MCG/ACT inhaler INHALE 2 PUFFS  INTO LUNGS EVERY 4 HOURS AS NEEDED FOR WHEEZING   montelukast (SINGULAIR) 10 MG tablet Take 1 tablet (10 mg total) by mouth daily.   Tiotropium Bromide Monohydrate (SPIRIVA RESPIMAT) 1.25 MCG/ACT AERS Use  2 Inhalations  15 minutes Apart   Daily   No current facility-administered medications on file prior to visit.    ROS: all negative except above.   Physical Exam:  There were no vitals taken for this visit.  General Appearance: Well nourished, in no apparent distress. Eyes: PERRLA, EOMs, conjunctiva no swelling or erythema Sinuses: No Frontal/maxillary tenderness ENT/Mouth: Ext aud canals clear, TMs without erythema, bulging. No erythema, swelling, or exudate on post pharynx.  Tonsils not swollen or erythematous. Hearing normal.  Neck: Supple, thyroid normal.  Respiratory: Respiratory effort normal, BS equal bilaterally without rales, rhonchi, wheezing or stridor.  Cardio: RRR with no MRGs. Brisk peripheral pulses without edema.  Abdomen: Soft, + BS.  Non tender, no guarding, rebound, hernias, masses. Lymphatics: Non tender without lymphadenopathy.  Musculoskeletal: Full ROM, 5/5 strength, normal gait.  Skin: Warm, dry without rashes, lesions, ecchymosis.  Neuro: Cranial nerves intact. Normal muscle tone, no cerebellar symptoms. Sensation intact.  Psych: Awake and oriented X 3, normal affect, Insight and Judgment appropriate.     Lori Humphrey, NP 11:07 AM [facility]

## 2021-07-22 ENCOUNTER — Ambulatory Visit: Payer: Self-pay | Admitting: Nurse Practitioner

## 2021-07-23 NOTE — Progress Notes (Deleted)
Assessment and Plan:  There are no diagnoses linked to this encounter.    Further disposition pending results of labs. Discussed med's effects and SE's.   Over 30 minutes of exam, counseling, chart review, and critical decision making was performed.   Future Appointments  Date Time Provider Department Center  07/26/2021 11:30 AM [name], NP [facility] None    ------------------------------------------------------------------------------------------------------------------   HPI There were no vitals taken for this visit. 26 y.o.female presents for  Past Medical History:  Diagnosis Date   Anxiety    Asthma    C. difficile diarrhea    Depression    Gastritis    Gastropathy    mild reactive   Headache    IBS (irritable bowel syndrome)    Obesity    Vitamin D deficiency      Allergies  Allergen Reactions   Breo Ellipta [Fluticasone Furoate-Vilanterol] Other (See Comments)    Thrush   Peanut-Containing Drug Products Other (See Comments)    Tingling and numbness in throat from tree nuts   Prednisone Other (See Comments)    Elevated temperature   Penicillins Rash    Augmentin   Alcohol-Sulfur [Elemental Sulfur]    Benzyl Alcohol Other (See Comments)   Metronidazole     Other reaction(s): Other (See Comments) Seizures and high temp.   Toradol [Ketorolac Tromethamine]     Syncope, nausea    Current Outpatient Medications on File Prior to Visit  Medication Sig   Albuterol Sulfate 108 (90 Base) MCG/ACT AEPB Inhale into the lungs.   budesonide-formoterol (SYMBICORT) 160-4.5 MCG/ACT inhaler Use 2 Inhalations (15 minutes apart)  2 x/day (every 12 hours)   buPROPion (WELLBUTRIN XL) 300 MG 24 hr tablet Take 300 mg by mouth daily.   cetirizine (ZYRTEC) 10 MG tablet Take 10 mg by mouth daily.   ketoconazole (NIZORAL) 2 % cream Apply 1 application topically daily. Apply to Ringworm rash 2 x /day til cleared   levalbuterol (XOPENEX HFA) 45 MCG/ACT inhaler INHALE 2 PUFFS  INTO LUNGS EVERY 4 HOURS AS NEEDED FOR WHEEZING   montelukast (SINGULAIR) 10 MG tablet Take 1 tablet (10 mg total) by mouth daily.   Tiotropium Bromide Monohydrate (SPIRIVA RESPIMAT) 1.25 MCG/ACT AERS Use  2 Inhalations  15 minutes Apart   Daily   No current facility-administered medications on file prior to visit.    ROS: all negative except above.   Physical Exam:  There were no vitals taken for this visit.  General Appearance: Well nourished, in no apparent distress. Eyes: PERRLA, EOMs, conjunctiva no swelling or erythema Sinuses: No Frontal/maxillary tenderness ENT/Mouth: Ext aud canals clear, TMs without erythema, bulging. No erythema, swelling, or exudate on post pharynx.  Tonsils not swollen or erythematous. Hearing normal.  Neck: Supple, thyroid normal.  Respiratory: Respiratory effort normal, BS equal bilaterally without rales, rhonchi, wheezing or stridor.  Cardio: RRR with no MRGs. Brisk peripheral pulses without edema.  Abdomen: Soft, + BS.  Non tender, no guarding, rebound, hernias, masses. Lymphatics: Non tender without lymphadenopathy.  Musculoskeletal: Full ROM, 5/5 strength, normal gait.  Skin: Warm, dry without rashes, lesions, ecchymosis.  Neuro: Cranial nerves intact. Normal muscle tone, no cerebellar symptoms. Sensation intact.  Psych: Awake and oriented X 3, normal affect, Insight and Judgment appropriate.     [name], NP 10:36 AM [name]

## 2021-07-26 ENCOUNTER — Ambulatory Visit: Payer: Self-pay | Admitting: Nurse Practitioner

## 2021-07-27 NOTE — Progress Notes (Signed)
Assessment and Plan:  [facility] was seen today for acute visit.  Diagnoses and all orders for this visit:  Hypercalcemia Advised to avoid calcium supplementation -     COMPLETE METABOLIC PANEL WITH GFR  Weight gain/Class 2 obesity due to excess calories without serious comorbidity with body mass index (BMI) of 37.0 to 37.9 in adult Continue diet and exercise Advised will research supplements -     Lipid panel -     TSH  Moderate persistent asthma Recommend decreasing Singulair to 1/2 tab daily since symptoms have started to return since stopping singulair Continue inhalers Monitor symptoms   Seasonal allergies Advised Quercitin < 1 gram daily may help with allergy symptoms Continue use of Neti Pot     Further disposition pending results of labs. Discussed med's effects and SE's.   Over 30 minutes of exam, counseling, chart review, and critical decision making was performed.   No future appointments.   ------------------------------------------------------------------------------------------------------------------   HPI BP 128/80    Pulse 90    Temp 97.9 F (36.6 C)    Wt 220 lb 3.2 oz (99.9 kg)    SpO2 97%    BMI 37.80 kg/m   25 y.o.female presents for allergies  She stopped Zyrtec and singulair for her allergies- believes she was having side effects of depression from Singulair and chronic fatigue with Zyrtec.  She is starting to have more chest tightness and shortness of breath on exertion since stopping these medications.  She has been gaining weight. Watching her diet, does orange theory 4 days a week. She is also doing cool sculpting of abdomen and flanks. She is concerned her thyroid is not functioning appropriately.  She is currently taking beef liver, sheep thymus, wild caught fish eggs and beef adrenal supplements to treat thyroid symptoms  BMI is Body mass index is 37.8 kg/m., she has been working on diet and exercise. Wt Readings from Last 3 Encounters:   07/28/21 220 lb 3.2 oz (99.9 kg)  03/08/21 214 lb 6.4 oz (97.3 kg)  12/28/20 218 lb (98.9 kg)     Past Medical History:  Diagnosis Date   Anxiety    Asthma    C. difficile diarrhea    Depression    Gastritis    Gastropathy    mild reactive   Headache    IBS (irritable bowel syndrome)    Obesity    Vitamin D deficiency      Allergies  Allergen Reactions   Breo Ellipta [Fluticasone Furoate-Vilanterol] Other (See Comments)    Thrush   Peanut-Containing Drug Products Other (See Comments)    Tingling and numbness in throat from tree nuts   Prednisone Other (See Comments)    Elevated temperature   Penicillins Rash    Augmentin   Alcohol-Sulfur [Elemental Sulfur]    Benzyl Alcohol Other (See Comments)   Metronidazole     Other reaction(s): Other (See Comments) Seizures and high temp.   Toradol [Ketorolac Tromethamine]     Syncope, nausea    Current Outpatient Medications on File Prior to Visit  Medication Sig   Albuterol Sulfate 108 (90 Base) MCG/ACT AEPB Inhale into the lungs.   budesonide-formoterol (SYMBICORT) 160-4.5 MCG/ACT inhaler Use 2 Inhalations (15 minutes apart)  2 x/day (every 12 hours)   buPROPion (WELLBUTRIN XL) 300 MG 24 hr tablet Take 300 mg by mouth daily.   levalbuterol (XOPENEX HFA) 45 MCG/ACT inhaler INHALE 2 PUFFS INTO LUNGS EVERY 4 HOURS AS NEEDED FOR WHEEZING   cetirizine (ZYRTEC)  10 MG tablet Take 10 mg by mouth daily. (Patient not taking: Reported on 07/28/2021)   ketoconazole (NIZORAL) 2 % cream Apply 1 application topically daily. Apply to Ringworm rash 2 x /day til cleared (Patient not taking: Reported on 07/28/2021)   montelukast (SINGULAIR) 10 MG tablet Take 1 tablet (10 mg total) by mouth daily. (Patient not taking: Reported on 07/28/2021)   Tiotropium Bromide Monohydrate (SPIRIVA RESPIMAT) 1.25 MCG/ACT AERS Use  2 Inhalations  15 minutes Apart   Daily (Patient not taking: Reported on 07/28/2021)   No current facility-administered medications on  file prior to visit.    ROS: all negative except above.   Physical Exam:  BP 128/80    Pulse 90    Temp 97.9 F (36.6 C)    Wt 220 lb 3.2 oz (99.9 kg)    SpO2 97%    BMI 37.80 kg/m   General Appearance: Well nourished, in no apparent distress. Eyes: PERRLA, EOMs, conjunctiva no swelling or erythema Sinuses: No Frontal/maxillary tenderness ENT/Mouth: Ext aud canals clear, TMs without erythema, bulging. No erythema, swelling, or exudate on post pharynx.  Tonsils not swollen or erythematous. Hearing normal.  Neck: Supple, thyroid normal.  Respiratory: Respiratory effort normal, BS equal bilaterally without rales, rhonchi, wheezing or stridor.  Cardio: RRR with no MRGs. Brisk peripheral pulses without edema.  Abdomen: Soft, + BS.  Non tender, no guarding, rebound, hernias, masses. Lymphatics: Non tender without lymphadenopathy.  Musculoskeletal: Full ROM, 5/5 strength, normal gait.  Skin: Warm, dry without rashes, lesions, ecchymosis.  Neuro: Cranial nerves intact. Normal muscle tone, no cerebellar symptoms. Sensation intact.  Psych: Awake and oriented X 3, normal affect, Insight and Judgment appropriate.     [name], NP 2:08 PM [facility]

## 2021-07-28 ENCOUNTER — Other Ambulatory Visit: Payer: Self-pay

## 2021-07-28 ENCOUNTER — Encounter: Payer: Self-pay | Admitting: Nurse Practitioner

## 2021-07-28 ENCOUNTER — Ambulatory Visit (INDEPENDENT_AMBULATORY_CARE_PROVIDER_SITE_OTHER): Payer: Self-pay | Admitting: Nurse Practitioner

## 2021-07-28 ENCOUNTER — Ambulatory Visit: Payer: Self-pay | Admitting: Nurse Practitioner

## 2021-07-28 DIAGNOSIS — Z6837 Body mass index (BMI) 37.0-37.9, adult: Secondary | ICD-10-CM

## 2021-07-28 DIAGNOSIS — J3089 Other allergic rhinitis: Secondary | ICD-10-CM

## 2021-07-28 DIAGNOSIS — J302 Other seasonal allergic rhinitis: Secondary | ICD-10-CM

## 2021-07-28 DIAGNOSIS — R635 Abnormal weight gain: Secondary | ICD-10-CM

## 2021-07-28 DIAGNOSIS — J4540 Moderate persistent asthma, uncomplicated: Secondary | ICD-10-CM

## 2021-07-28 DIAGNOSIS — E6609 Other obesity due to excess calories: Secondary | ICD-10-CM

## 2021-07-28 NOTE — Patient Instructions (Signed)
Stinging nettle, Quercetin and milk thistle are supplements that can help allergies Return for labs Allergies, Adult An allergy is a condition in which the body's defense system (immune system) comes in contact with an allergen and reacts to it. An allergen is anything that causes an allergic reaction. Allergens cause the immune system to make proteins for fighting infections (antibodies). These antibodies cause cells to release chemicals called histamines that set off the symptoms of an allergic reaction. Allergies often affect the nasal passages (allergic rhinitis), eyes (allergic conjunctivitis), skin (atopic dermatitis), and stomach. Allergies can be mild, moderate, or severe. They cannot spread from person to person. Allergies can develop at any age and may be outgrown. What are the causes? This condition is caused by allergens. Common allergens include: Outdoor allergens, such as pollen, car fumes, and mold. Indoor allergens, such as dust, smoke, mold, and pet dander. Other allergens, such as foods, medicines, scents, insect bites or stings, and other skin irritants. What increases the risk? You are more likely to develop this condition if you have: Family members with allergies. Family members who have any condition that may be caused by allergens, such as asthma. This may make you more likely to have other allergies. What are the signs or symptoms? Symptoms of this condition depend on the severity of the allergy. Mild to moderate symptoms Runny nose, stuffy nose (nasal congestion), or sneezing. Itchy mouth, ears, or throat. A feeling of mucus dripping down the back of your throat (postnasal drip). Sore throat. Itchy, red, watery, or puffy eyes. Skin rash, or itchy, red, swollen areas of skin (hives). Stomach cramps or bloating. Severe symptoms Severe allergies to food, medicine, or insect bites may cause anaphylaxis, which can be life-threatening. Symptoms include: A red (flushed)  face. Wheezing or coughing. Swollen lips, tongue, or mouth. Tight or swollen throat. Chest pain or tightness, or rapid heartbeat. Trouble breathing or shortness of breath. Pain in the abdomen, vomiting, or diarrhea. Dizziness or fainting. How is this diagnosed? This condition is diagnosed based on your symptoms, your family and medical history, and a physical exam. You may also have tests, including: Skin tests to see how your skin reacts to allergens that may be causing your symptoms. Tests include: Skin prick test. For this test, an allergen is introduced to your body through a small opening in the skin. Intradermal skin test. For this test, a small amount of allergen is injected under the first layer of your skin. Patch test. For this test, a small amount of allergen is placed on your skin. The area is covered and then checked after a few days. Blood tests. A challenge test. For this test, you will eat or breathe in a small amount of allergen to see if you have an allergic reaction. You may also be asked to: Keep a food diary. This is a record of all the foods, drinks, and symptoms you have in a day. Try an elimination diet. To do this: Remove certain foods from your diet. Add those foods back one by one to find out if any foods cause an allergic reaction. How is this treated?   Treatment for allergies depends on your symptoms. Treatment may include: Cold, wet cloths (cold compresses) to soothe itching and swelling. Eye drops or nasal sprays. Nasal irrigation to help clear your mucus or keep the nasal passages moist. A humidifier to add moisture to the air. Skin creams to treat rashes or itching. Oral antihistamines or other medicines to block the reaction  or to treat inflammation. Diet changes to remove foods that cause allergies. Being exposed again and again to tiny amounts of allergens to help you build a defense against it (tolerance). This is called immunotherapy. Examples  include: Allergy shot. You receive an injection that contains an allergen. Sublingual immunotherapy. You take a small dose of allergen under your tongue. Emergency injection for anaphylaxis. You give yourself a shot using a syringe (auto-injector) that contains the amount of medicine you need. Your health care provider will teach you how to give yourself an injection. Follow these instructions at home: Medicines  Take or apply over-the-counter and prescription medicines only as told by your health care provider. Always carry your auto-injector pen if you are at risk of anaphylaxis. Give yourself an injection as told by your health care provider. Eating and drinking Follow instructions from your health care provider about eating or drinking restrictions. Drink enough fluid to keep your urine pale yellow. General instructions Wear a medical alert bracelet or necklace to let others know that you have had anaphylaxis before. Avoid known allergens whenever possible. Keep all follow-up visits as told by your health care provider. This is important. Contact a health care provider if: Your symptoms do not get better with treatment. Get help right away if: You have symptoms of anaphylaxis. These include: Swollen mouth, tongue, or throat. Pain or tightness in your chest. Trouble breathing or shortness of breath. Dizziness or fainting. Severe abdominal pain, vomiting, or diarrhea. These symptoms may represent a serious problem that is an emergency. Do not wait to see if the symptoms will go away. Get medical help right away. Call your local emergency services (911 in the [place] Do not drive yourself to the hospital. Summary Take or apply over-the-counter and prescription medicines only as told by your health care provider. Avoid known allergens when possible. Always carry your auto-injector pen if you are at risk of anaphylaxis. Give yourself an injection as told by your health care provider. Wear  a medical alert bracelet or necklace to let others know that you have had anaphylaxis before. Anaphylaxis is a life-threatening emergency. Get help right away. This information is not intended to replace advice given to you by your health care provider. Make sure you discuss any questions you have with your health care provider. Document Revised: 02/03/2020 Document Reviewed: 04/17/2019 Elsevier Patient Education  2022 [employer].

## 2021-07-29 LAB — COMPLETE METABOLIC PANEL WITH GFR
AG Ratio: 2 (calc) (ref 1.0–2.5)
ALT: 9 U/L (ref 6–29)
AST: 14 U/L (ref 10–30)
Albumin: 4.9 g/dL (ref 3.6–5.1)
Alkaline phosphatase (APISO): 97 U/L (ref 31–125)
BUN: 13 mg/dL (ref 7–25)
CO2: 25 mmol/L (ref 20–32)
Calcium: 9.9 mg/dL (ref 8.6–10.2)
Chloride: 103 mmol/L (ref 98–110)
Creat: 0.8 mg/dL (ref 0.50–0.96)
Globulin: 2.5 g/dL (calc) (ref 1.9–3.7)
Glucose, Bld: 75 mg/dL (ref 65–99)
Potassium: 3.9 mmol/L (ref 3.5–5.3)
Sodium: 139 mmol/L (ref 135–146)
Total Bilirubin: 0.5 mg/dL (ref 0.2–1.2)
Total Protein: 7.4 g/dL (ref 6.1–8.1)
eGFR: 104 mL/min/{1.73_m2} (ref >=60)

## 2021-07-29 LAB — LIPID PANEL
Cholesterol: 188 mg/dL (ref <200)
HDL: 63 mg/dL (ref >=50)
LDL Cholesterol (Calc): 103 mg/dL (calc) — ABNORMAL HIGH
Non-HDL Cholesterol (Calc): 125 mg/dL (calc) (ref <130)
Total CHOL/HDL Ratio: 3 (calc) (ref <5.0)
Triglycerides: 119 mg/dL (ref <150)

## 2021-07-29 LAB — TSH: TSH: 3.49 mIU/L

## 2021-08-13 ENCOUNTER — Encounter: Payer: Self-pay | Admitting: Nurse Practitioner

## 2021-08-14 ENCOUNTER — Other Ambulatory Visit: Payer: Self-pay | Admitting: Nurse Practitioner

## 2021-08-14 DIAGNOSIS — R42 Dizziness and giddiness: Secondary | ICD-10-CM

## 2021-09-23 ENCOUNTER — Ambulatory Visit (INDEPENDENT_AMBULATORY_CARE_PROVIDER_SITE_OTHER): Payer: Self-pay | Admitting: Nurse Practitioner

## 2021-09-23 ENCOUNTER — Encounter: Payer: Self-pay | Admitting: Nurse Practitioner

## 2021-09-23 VITALS — BP 132/82 | HR 101 | Temp 97.0°F | Wt 214.4 lb

## 2021-09-23 DIAGNOSIS — E6609 Other obesity due to excess calories: Secondary | ICD-10-CM

## 2021-09-23 DIAGNOSIS — Z6837 Body mass index (BMI) 37.0-37.9, adult: Secondary | ICD-10-CM

## 2021-09-23 DIAGNOSIS — R000 Tachycardia, unspecified: Secondary | ICD-10-CM

## 2021-09-23 DIAGNOSIS — E785 Hyperlipidemia, unspecified: Secondary | ICD-10-CM

## 2021-09-23 DIAGNOSIS — F411 Generalized anxiety disorder: Secondary | ICD-10-CM

## 2021-09-23 MED ORDER — BUPROPION HCL ER (XL) 150 MG PO TB24: 150.0000 mg | ORAL_TABLET | ORAL | 2 refills | Status: DC

## 2021-09-23 NOTE — Progress Notes (Signed)
Assessment and Plan: ? ?[facility] was seen today for acute visit. ? ?Diagnoses and all orders for this visit: ? ?Class 2 obesity due to excess calories without serious comorbidity with body mass index (BMI) of 37.0 to 37.9 in adult ?Continue diet and exercise ? ?Generalized anxiety disorder ?-     buPROPion (WELLBUTRIN XL) 150 MG 24 hr tablet; Take 1 tablet (150 mg total) by mouth every morning. ?-Pt is wanting to decrease Wellbutrin and discontinue, New script sent for Wellbutrin 150 mg to take daily for 2 weeks then every other day x 2 weeks then stop medication ?- Chest pressure and elevated heart rate are most likely doe to anxiety- continue behavior modification, practice good sleep hygiene, continue diet and exercise ? ? ?Hyperlipidemia LDL goal <100 ?Counseled on decreasing red meat, dairy, eggs and fried foods ?Continue exercise ? ? Tachycardia ?- Has started to improve ?- EKG NSR, no ST changes ?- Practice deep breathing and relaxation techniques ?- Go to the ER if any chest pain, shortness of breath, nausea, dizziness, severe HA, changes vision/speech  ? ? ? ?Further disposition pending results of labs. Discussed med's effects and SE's.   ?Over 30 minutes of exam, counseling, chart review, and critical decision making was performed.  ? ?Future Appointments  ?Date Time Provider Department Center  ?09/23/2021  2:00 PM Jelan Batterton, [name], NP [facility] None  ? ? ?------------------------------------------------------------------------------------------------------------------ ? ? ?HPI ?BP 132/82   Pulse (!) 101   Temp (!) 97 ?F (36.1 ?C)   Wt 214 lb 6.4 oz (97.3 kg)   SpO2 97%   BMI 36.80 kg/m?  ? ?26 y.o.female presents for elevation of blood pressure. Was elevated at GYN office this morning to 144/84.  Feels like her heart was racing since yesterday. Believes she feels an occasional palpitation. Difficulty sleeping last night.  ?She is under more stress lately.  She has been dealing with ongoing yeast infection  and has been on Diflucan 150 mg x 3 doses.  ?Has not had to use Albuterol inhaler recently.  Takes Symbicort daily.  ? ?Her Allergies have been worse, has rhinorrhea, fullness in ears and sneezing.  She has not taken any decongestants.  ? ?Has not been taking any thyroid over the counter supplement.  ? ?She has been having red meat twice a week as well as eggs frequently ?Lab Results  ?Component Value Date  ? CHOL 188 07/28/2021  ? HDL 63 07/28/2021  ? LDLCALC 103 (H) 07/28/2021  ? TRIG 119 07/28/2021  ? CHOLHDL 3.0 07/28/2021  ?  ? ?Past Medical History:  ?Diagnosis Date  ? Anxiety   ? Asthma   ? C. difficile diarrhea   ? Depression   ? Gastritis   ? 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 Headache   ? IBS (irritable bowel syndrome)   ? Obesity   ? Vitamin D deficiency   ?  ? ?Allergies  ?Allergen Reactions  ? Breo Ellipta [Fluticasone Furoate-Vilanterol] Other (See Comments)  ?  Thrush  ? Peanut-Containing Drug Products Other (See Comments)  ?  Tingling and numbness in throat from tree nuts  ? Prednisone Other (See Comments)  ?  Elevated temperature  ? Penicillins Rash  ?  Augmentin  ? Alcohol-Sulfur [Elemental Sulfur]   ? Benzyl Alcohol Other (See Comments)  ? Metronidazole   ?  Other reaction(s): Other (See Comments) ?Seizures and high temp.  ? Toradol [Ketorolac Tromethamine]   ?  Syncope, nausea  ? ? ?Current Outpatient  Medications on File Prior to Visit  ?Medication Sig  ? budesonide-formoterol (SYMBICORT) 160-4.5 MCG/ACT inhaler Use 2 Inhalations (15 minutes apart)  2 x/day (every 12 hours)  ? buPROPion (WELLBUTRIN XL) 300 MG 24 hr tablet Take 300 mg by mouth daily.  ? ketoconazole (NIZORAL) 2 % cream Apply 1 application topically daily. Apply to Ringworm rash 2 x /day til cleared  ? levalbuterol (XOPENEX HFA) 45 MCG/ACT inhaler INHALE 2 PUFFS INTO LUNGS EVERY 4 HOURS AS NEEDED FOR WHEEZING  ? montelukast (SINGULAIR) 10 MG tablet Take 1 tablet (10 mg total) by mouth daily.  ? Albuterol Sulfate 108 (90 Base) MCG/ACT  AEPB Inhale into the lungs. (Patient not taking: Reported on 09/23/2021)  ? ?No current facility-administered medications on file prior to visit.  ? ? ?ROS: all negative except above.  ? ?Physical Exam: ? ?BP 132/82   Pulse (!) 101   Temp (!) 97 ?F (36.1 ?C)   Wt 214 lb 6.4 oz (97.3 kg)   SpO2 97%   BMI 36.80 kg/m?  ? ?General Appearance: Well nourished, in no apparent distress. ?Eyes: PERRLA, EOMs, conjunctiva no swelling or erythema ?Sinuses: No Frontal/maxillary tenderness ?ENT/Mouth: Ext aud canals clear, TMs without erythema, bulging. No erythema, swelling, or exudate on post pharynx.  Tonsils not swollen or erythematous. Hearing normal.  ?Neck: Supple, thyroid normal.  ?Respiratory: Respiratory effort normal, BS equal bilaterally without rales, rhonchi, wheezing or stridor.  ?Cardio: RRR with no MRGs. Brisk peripheral pulses without edema.  ?Abdomen: Soft, + BS.  Non tender, no guarding, rebound, hernias, masses. ?Lymphatics: Non tender without lymphadenopathy.  ?Musculoskeletal: Full ROM, 5/5 strength, normal gait.  ?Skin: Warm, dry without rashes, lesions, ecchymosis.  ?Neuro: Cranial nerves intact. Normal muscle tone, no cerebellar symptoms. Sensation intact.  ?Psych: Awake and oriented X 3, normal affect, Insight and Judgment appropriate.  ?EKG: NSR, no ST changes  ? ?[name], NP ?1:57 PM ?[facility] ? ?

## 2021-11-01 ENCOUNTER — Encounter: Payer: Self-pay | Admitting: Nurse Practitioner

## 2021-11-03 ENCOUNTER — Ambulatory Visit: Payer: Self-pay | Admitting: Nurse Practitioner

## 2021-11-03 ENCOUNTER — Ambulatory Visit (INDEPENDENT_AMBULATORY_CARE_PROVIDER_SITE_OTHER): Payer: Self-pay | Admitting: Nurse Practitioner

## 2021-11-03 ENCOUNTER — Encounter: Payer: Self-pay | Admitting: Nurse Practitioner

## 2021-11-03 VITALS — BP 104/78 | HR 72 | Temp 97.5°F | Wt 214.0 lb

## 2021-11-03 DIAGNOSIS — L739 Follicular disorder, unspecified: Secondary | ICD-10-CM

## 2021-11-03 DIAGNOSIS — J020 Streptococcal pharyngitis: Secondary | ICD-10-CM

## 2021-11-03 LAB — POCT RAPID STREP A (OFFICE): Rapid Strep A Screen: POSITIVE — AB

## 2021-11-03 MED ORDER — CEPHALEXIN 500 MG PO CAPS: 1000.0000 mg | ORAL_CAPSULE | Freq: Two times a day (BID) | ORAL | 0 refills | Status: AC

## 2021-11-03 NOTE — Progress Notes (Signed)
Assessment and Plan: ? ?Lori West was seen today for acute visit. ? ?Diagnoses and all orders for this visit: ? ?Folliculitis ?Keep area clean and dry ?Appears to be healing on its own, if increases in size, drains notify the office ? ?Strep Throat ?Keflex 1000 mg BID x 7 ?Salt water gargles as needed ?Continue Mucinex, push fluids ?Use cepacol or chloraseptic as needed ?Humidifier in bedroom at night ?Claritin for allergies ?Rapid strep negative ? ? ? ?Further disposition pending results of labs. Discussed med's effects and SE's.   ?Over 30 minutes of exam, counseling, chart review, and critical decision making was performed.  ? ?No future appointments. ? ? ?------------------------------------------------------------------------------------------------------------------ ? ? ?HPI ?BP 104/78   Pulse 72   Temp (!) 97.5 ?F (36.4 ?C)   Wt 214 lb (97.1 kg)   LMP 10/13/2021   SpO2 98%   BMI 36.73 kg/m?  ? ?26 y.o.female presents for sore throat with post nasal drip x 3 days.  Denies fever, cough, nausea and vomiting.  She has used Phenylephrine with no relief of symptoms.  Also notes a swollen lymph node right side of neck. ? ?She also had noticed a reddened area on her right thigh which she stated had gotten larger in area.  Since she originally called for the appointment it has gotten smaller in size.  Nontender and no drainage  ? ?Past Medical History:  ?Diagnosis Date  ? Anxiety   ? Asthma   ? C. difficile diarrhea   ? Depression   ? Gastritis   ? 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 Headache   ? IBS (irritable bowel syndrome)   ? Obesity   ? Vitamin D deficiency   ?  ? ?Allergies  ?Allergen Reactions  ? Breo Ellipta [Fluticasone Furoate-Vilanterol] Other (See Comments)  ?  Thrush  ? Peanut-Containing Drug Products Other (See Comments)  ?  Tingling and numbness in throat from tree nuts  ? Prednisone Other (See Comments)  ?  Elevated temperature  ? Penicillins Rash  ?  Augmentin  ? Alcohol-Sulfur [Elemental Sulfur]   ?  Benzyl Alcohol Other (See Comments)  ? Metronidazole   ?  Other reaction(s): Other (See Comments) ?Seizures and high temp.  ? Toradol [Ketorolac Tromethamine]   ?  Syncope, nausea  ? ? ?Current Outpatient Medications on File Prior to Visit  ?Medication Sig  ? budesonide-formoterol (SYMBICORT) 160-4.5 MCG/ACT inhaler Use 2 Inhalations (15 minutes apart)  2 x/day (every 12 hours)  ? buPROPion (WELLBUTRIN XL) 150 MG 24 hr tablet Take 1 tablet (150 mg total) by mouth every morning.  ? levalbuterol (XOPENEX HFA) 45 MCG/ACT inhaler INHALE 2 PUFFS INTO LUNGS EVERY 4 HOURS AS NEEDED FOR WHEEZING  ? Albuterol Sulfate 108 (90 Base) MCG/ACT AEPB Inhale into the lungs. (Patient not taking: Reported on 09/23/2021)  ? ketoconazole (NIZORAL) 2 % cream Apply 1 application topically daily. Apply to Ringworm rash 2 x /day til cleared (Patient not taking: Reported on 11/03/2021)  ? montelukast (SINGULAIR) 10 MG tablet Take 1 tablet (10 mg total) by mouth daily. (Patient not taking: Reported on 11/03/2021)  ? ?No current facility-administered medications on file prior to visit.  ? ? ?ROS: all negative except above.  ? ?Physical Exam: ? ?BP 104/78   Pulse 72   Temp (!) 97.5 ?F (36.4 ?C)   Wt 214 lb (97.1 kg)   LMP 10/13/2021   SpO2 98%   BMI 36.73 kg/m?  ? ?General Appearance: Well nourished, in no apparent  distress. ?Eyes: PERRLA, EOMs, conjunctiva no swelling or erythema ?Sinuses: No Frontal/maxillary tenderness ?ENT/Mouth: Ext aud canals clear, TMs without erythema, bulging. Post pharynx erythematous, no exudate. Tonsils not swollen or erythematous. Hearing normal.  ?Neck: Supple, thyroid normal.  ?Respiratory: Respiratory effort normal, BS equal bilaterally without rales, rhonchi, wheezing or stridor.  ?Cardio: RRR with no MRGs. Brisk peripheral pulses without edema.  ?Abdomen: Soft, + BS.  Non tender, no guarding, rebound, hernias, masses. ?Lymphatics: + right submandibular adenopathy ?Musculoskeletal: Full ROM, 5/5 strength,  normal gait.  ?Skin: Warm, dry without rashes, lesions, ecchymosis.  ?Neuro: Cranial nerves intact. Normal muscle tone, no cerebellar symptoms. Sensation intact.  ?Psych: Awake and oriented X 3, normal affect, Insight and Judgment appropriate.  ?  ? ?[name], NP ?10:33 AM ?[facility] ? ?

## 2021-11-03 NOTE — Patient Instructions (Signed)
Sore Throat A sore throat is pain, burning, irritation, or scratchiness in the throat. When you have a sore throat, you may feel pain or tenderness in your throat when you swallow or talk. Many things can cause a sore throat, including: An infection. Seasonal allergies. Dryness in the air. Irritants, such as smoke or pollution. Radiation treatment for cancer. Gastroesophageal reflux disease (GERD). A tumor. A sore throat is often the first sign of another sickness. It may happen with other symptoms, such as coughing, sneezing, fever, and swollen neck glands. Most sore throats go away without medical treatment. Follow these instructions at home:     Medicines Take over-the-counter and prescription medicines only as told by your health care provider. Children often get sore throats. Do not give your child aspirin because of the association with Reye's syndrome. Use throat sprays to soothe your throat as told by your health care provider. Managing pain To help with pain, try: Sipping warm liquids, such as broth, herbal tea, or warm water. Eating or drinking cold or frozen liquids, such as frozen ice pops. Gargling with a mixture of salt and water 3-4 times a day or as needed. To make salt water, completely dissolve -1 tsp (3-6 g) of salt in 1 cup (237 mL) of warm water. Sucking on hard candy or throat lozenges. Putting a cool-mist humidifier in your bedroom at night to moisten the air. Sitting in the bathroom with the door closed for 5-10 minutes while you run hot water in the shower. General instructions Do not use any products that contain nicotine or tobacco. These products include cigarettes, chewing tobacco, and vaping devices, such as e-cigarettes. If you need help quitting, ask your health care provider. Rest as needed. Drink enough fluid to keep your urine pale yellow. Wash your hands often with soap and water for at least 20 seconds. If soap and water are not available, use hand  sanitizer. Contact a health care provider if: You have a fever for more than 2-3 days. You have symptoms that last for more than 2-3 days. Your throat does not get better within 7 days. You have a fever and your symptoms suddenly get worse. Get help right away if: You have difficulty breathing. You cannot swallow fluids, soft foods, or your saliva. You have increased swelling in your throat or neck. You have persistent nausea and vomiting. These symptoms may represent a serious problem that is an emergency. Do not wait to see if the symptoms will go away. Get medical help right away. Call your local emergency services (911 in the [place] Do not drive yourself to the hospital. Summary A sore throat is pain, burning, irritation, or scratchiness in the throat. Many things can cause a sore throat. Take over-the-counter medicines only as told by your health care provider. Rest as needed. Drink enough fluid to keep your urine pale yellow. Contact a health care provider if your throat does not get better within 7 days. This information is not intended to replace advice given to you by your health care provider. Make sure you discuss any questions you have with your health care provider. Document Revised: 09/02/2020 Document Reviewed: 09/02/2020 Elsevier Patient Education  2023 [employer] Inc[employer]  

## 2021-11-04 ENCOUNTER — Ambulatory Visit: Payer: Self-pay | Admitting: Nurse Practitioner

## 2021-12-31 ENCOUNTER — Other Ambulatory Visit: Payer: Self-pay | Admitting: Nurse Practitioner

## 2021-12-31 ENCOUNTER — Other Ambulatory Visit: Payer: Self-pay | Admitting: Adult Health

## 2021-12-31 ENCOUNTER — Telehealth: Payer: Self-pay | Admitting: Adult Health

## 2021-12-31 DIAGNOSIS — F411 Generalized anxiety disorder: Secondary | ICD-10-CM

## 2021-12-31 NOTE — Telephone Encounter (Signed)
Called pt to make a f/u appt per Ashley's request, pt said she is no longer on Wellbutrin or Singulair. I asked her if she would be willing to make a future follow up appt, she said not at this time.

## 2022-03-02 ENCOUNTER — Encounter: Payer: Self-pay | Admitting: Adult Health

## 2022-09-12 ENCOUNTER — Telehealth: Payer: Self-pay | Admitting: Nurse Practitioner

## 2022-09-12 NOTE — Telephone Encounter (Signed)
She will need to have an appointment scheduled before medication can be refilled

## 2022-09-12 NOTE — Telephone Encounter (Signed)
Pt is requesting a refill on Symbicort and Albuterol to go the [facility] [place], [place] added in the chart

## 2022-09-13 NOTE — Progress Notes (Deleted)
Assessment and Plan:  There are no diagnoses linked to this encounter.    Further disposition pending results of labs. Discussed med's effects and SE's.   Over 30 minutes of exam, counseling, chart review, and critical decision making was performed.   Future Appointments  Date Time Provider [place]  09/14/2022 11:30 AM Alycia Rossetti, NP [facility] None    ------------------------------------------------------------------------------------------------------------------   HPI There were no vitals taken for this visit. 27 y.o.female presents for  Past Medical History:  Diagnosis Date   Anxiety    Asthma    C. difficile diarrhea    Depression    Gastritis    Gastropathy    mild reactive   Headache    IBS (irritable bowel syndrome)    Obesity    Vitamin D deficiency      Allergies  Allergen Reactions   Breo Ellipta [Fluticasone Furoate-Vilanterol] Other (See Comments)    Thrush   Peanut-Containing Drug Products Other (See Comments)    Tingling and numbness in throat from tree nuts   Prednisone Other (See Comments)    Elevated temperature   Penicillins Rash    Augmentin   Alcohol-Sulfur [Elemental Sulfur]    Benzyl Alcohol Other (See Comments)   Metronidazole     Other reaction(s): Other (See Comments) Seizures and high temp.   Toradol [Ketorolac Tromethamine]     Syncope, nausea    Current Outpatient Medications on File Prior to Visit  Medication Sig   Albuterol Sulfate 108 (90 Base) MCG/ACT AEPB Inhale into the lungs. (Patient not taking: Reported on 09/23/2021)   budesonide-formoterol (SYMBICORT) 160-4.5 MCG/ACT inhaler Use 2 Inhalations (15 minutes apart)  2 x/day (every 12 hours)   ketoconazole (NIZORAL) 2 % cream Apply 1 application topically daily. Apply to Ringworm rash 2 x /day til cleared (Patient not taking: Reported on 11/03/2021)   levalbuterol (XOPENEX HFA) 45 MCG/ACT inhaler INHALE 2 PUFFS INTO LUNGS EVERY 4 HOURS AS NEEDED FOR WHEEZING    No current facility-administered medications on file prior to visit.    ROS: all negative except above.   Physical Exam:  There were no vitals taken for this visit.  General Appearance: Well nourished, in no apparent distress. Eyes: PERRLA, EOMs, conjunctiva no swelling or erythema Sinuses: No Frontal/maxillary tenderness ENT/Mouth: Ext aud canals clear, TMs without erythema, bulging. No erythema, swelling, or exudate on post pharynx.  Tonsils not swollen or erythematous. Hearing normal.  Neck: Supple, thyroid normal.  Respiratory: Respiratory effort normal, BS equal bilaterally without rales, rhonchi, wheezing or stridor.  Cardio: RRR with no MRGs. Brisk peripheral pulses without edema.  Abdomen: Soft, + BS.  Non tender, no guarding, rebound, hernias, masses. Lymphatics: Non tender without lymphadenopathy.  Musculoskeletal: Full ROM, 5/5 strength, normal gait.  Skin: Warm, dry without rashes, lesions, ecchymosis.  Neuro: Cranial nerves intact. Normal muscle tone, no cerebellar symptoms. Sensation intact.  Psych: Awake and oriented X 3, normal affect, Insight and Judgment appropriate.     Alycia Rossetti, NP 12:35 PM [facility]

## 2022-09-14 ENCOUNTER — Ambulatory Visit: Payer: Self-pay | Admitting: Nurse Practitioner

## 2022-09-14 NOTE — Progress Notes (Deleted)
Assessment and Plan:  There are no diagnoses linked to this encounter.    Further disposition pending results of labs. Discussed med's effects and SE's.   Over 30 minutes of exam, counseling, chart review, and critical decision making was performed.   Future Appointments  Date Time Provider [place]  09/15/2022  3:00 PM Alycia Rossetti, NP [facility] None    ------------------------------------------------------------------------------------------------------------------   HPI There were no vitals taken for this visit. 27 y.o.female presents for  Past Medical History:  Diagnosis Date   Anxiety    Asthma    C. difficile diarrhea    Depression    Gastritis    Gastropathy    mild reactive   Headache    IBS (irritable bowel syndrome)    Obesity    Vitamin D deficiency      Allergies  Allergen Reactions   Breo Ellipta [Fluticasone Furoate-Vilanterol] Other (See Comments)    Thrush   Peanut-Containing Drug Products Other (See Comments)    Tingling and numbness in throat from tree nuts   Prednisone Other (See Comments)    Elevated temperature   Penicillins Rash    Augmentin   Alcohol-Sulfur [Elemental Sulfur]    Benzyl Alcohol Other (See Comments)   Metronidazole     Other reaction(s): Other (See Comments) Seizures and high temp.   Toradol [Ketorolac Tromethamine]     Syncope, nausea    Current Outpatient Medications on File Prior to Visit  Medication Sig   Albuterol Sulfate 108 (90 Base) MCG/ACT AEPB Inhale into the lungs. (Patient not taking: Reported on 09/23/2021)   budesonide-formoterol (SYMBICORT) 160-4.5 MCG/ACT inhaler Use 2 Inhalations (15 minutes apart)  2 x/day (every 12 hours)   ketoconazole (NIZORAL) 2 % cream Apply 1 application topically daily. Apply to Ringworm rash 2 x /day til cleared (Patient not taking: Reported on 11/03/2021)   levalbuterol (XOPENEX HFA) 45 MCG/ACT inhaler INHALE 2 PUFFS INTO LUNGS EVERY 4 HOURS AS NEEDED FOR WHEEZING    No current facility-administered medications on file prior to visit.    ROS: all negative except above.   Physical Exam:  There were no vitals taken for this visit.  General Appearance: Well nourished, in no apparent distress. Eyes: PERRLA, EOMs, conjunctiva no swelling or erythema Sinuses: No Frontal/maxillary tenderness ENT/Mouth: Ext aud canals clear, TMs without erythema, bulging. No erythema, swelling, or exudate on post pharynx.  Tonsils not swollen or erythematous. Hearing normal.  Neck: Supple, thyroid normal.  Respiratory: Respiratory effort normal, BS equal bilaterally without rales, rhonchi, wheezing or stridor.  Cardio: RRR with no MRGs. Brisk peripheral pulses without edema.  Abdomen: Soft, + BS.  Non tender, no guarding, rebound, hernias, masses. Lymphatics: Non tender without lymphadenopathy.  Musculoskeletal: Full ROM, 5/5 strength, normal gait.  Skin: Warm, dry without rashes, lesions, ecchymosis.  Neuro: Cranial nerves intact. Normal muscle tone, no cerebellar symptoms. Sensation intact.  Psych: Awake and oriented X 3, normal affect, Insight and Judgment appropriate.     Alycia Rossetti, NP 1:04 PM [facility]

## 2022-09-15 ENCOUNTER — Ambulatory Visit: Payer: Self-pay | Admitting: Nurse Practitioner

## 2023-03-06 ENCOUNTER — Encounter: Payer: Self-pay | Admitting: Nurse Practitioner

## 2023-03-06 ENCOUNTER — Ambulatory Visit (INDEPENDENT_AMBULATORY_CARE_PROVIDER_SITE_OTHER): Payer: Self-pay | Admitting: Nurse Practitioner

## 2023-03-06 VITALS — BP 128/74 | HR 92 | Temp 97.9°F | Resp 16 | Ht 63.0 in | Wt 193.0 lb

## 2023-03-06 DIAGNOSIS — Z79899 Other long term (current) drug therapy: Secondary | ICD-10-CM

## 2023-03-06 DIAGNOSIS — Z1329 Encounter for screening for other suspected endocrine disorder: Secondary | ICD-10-CM

## 2023-03-06 DIAGNOSIS — J4540 Moderate persistent asthma, uncomplicated: Secondary | ICD-10-CM

## 2023-03-06 DIAGNOSIS — R0602 Shortness of breath: Secondary | ICD-10-CM

## 2023-03-06 DIAGNOSIS — B354 Tinea corporis: Secondary | ICD-10-CM

## 2023-03-06 DIAGNOSIS — R233 Spontaneous ecchymoses: Secondary | ICD-10-CM

## 2023-03-06 DIAGNOSIS — Z13220 Encounter for screening for lipoid disorders: Secondary | ICD-10-CM

## 2023-03-06 DIAGNOSIS — R5383 Other fatigue: Secondary | ICD-10-CM

## 2023-03-06 DIAGNOSIS — Z131 Encounter for screening for diabetes mellitus: Secondary | ICD-10-CM

## 2023-03-06 MED ORDER — TERBINAFINE HCL 250 MG PO TABS
250.0000 mg | ORAL_TABLET | Freq: Every day | ORAL | 0 refills | Status: DC
Start: 2023-03-06 — End: 2023-03-15

## 2023-03-06 NOTE — Progress Notes (Unsigned)
Assessment and Plan:  Lori West was seen today for an episodic visit.  Diagnoses and all order for this visit:  Bruises easily Continue to monitor for spreading of ecchymosis  - CBC with Differential/Platelet  Shortness of breath/Asthma Continue Albuterol PRN Re-introduce daily maintenance inhaler Discussed CXR if s/s fail to improve  - Iron, TIBC and Ferritin Panel  Fatigue, unspecified type  - CBC with Differential/Platelet - COMPLETE METABOLIC PANEL WITH GFR - Iron, TIBC and Ferritin Panel  Ringworm of body Start tmt with Terbinafine for full resolution. Continue to monitor.   - terbinafine (LAMISIL) 250 MG tablet; Take 1 tablet (250 mg total) by mouth daily. Take 1 pill daily for one month, off for 1 month, one a month, etc until gone  Dispense: 90 tablet; Refill: 0  Screening for thyroid disorder  - TSH  Screening for diabetes mellitus (DM)  - Hemoglobin A1c - Insulin, random  Screening for cholesterol level   -Lipid   Medication management All medications discussed and reviewed in full. All questions and concerns regarding medications addressed.    - CBC with Differential/Platelet - COMPLETE METABOLIC PANEL WITH GFR - TSH - Hemoglobin A1c - Insulin, random - Iron, TIBC and Ferritin Panel - terbinafine (LAMISIL) 250 MG tablet; Take 1 tablet (250 mg total) by mouth daily. Take 1 pill daily for one month, off for 1 month, one a month, etc until gone  Dispense: 90 tablet; Refill: 0 - Lipid panel  Notify office for further evaluation and treatment, questions or concerns if s/s fail to improve. The risks and benefits of my recommendations, as well as other treatment options were discussed with the patient today. Questions were answered.  Further disposition pending results of labs. Discussed med's effects and SE's.    Over 25 minutes of exam, counseling, chart review, and critical decision making was performed.   No future  appointments.   ------------------------------------------------------------------------------------------------------------------   HPI BP 128/74   Pulse 92   Temp 97.9 F (36.6 C)   Resp 16   Ht 5\' 3"  (1.6 m)   Wt 193 lb (87.5 kg)   SpO2 99%   BMI 34.19 kg/m   27 y.o.female presents for general follow up.  Last f/u in clinic was 10/2021.  She is here with complaints and evaluation of noticeable bruising on BLE and lower back for the last 2-3 weeks.  She denies any use of alcohol or NSAIDS.  She denies any recent fall or trauma.    She has a hx of moderate persistent asthma.  States that she was not using a maintenance inhaler at all prior to the increase in brusing.  She assocaties the brusiing with SOB and fatigue.  States she is sleeping just as much as she is awake.  She dnies any OSA however has not been evaluated in the past.  Lives alone.  Doe snot notice waking herself up.  She has been a member of [employer] for 8 years but has been unable to complete her workouts d/t the SOB and fatigue whe is experiencing.  She reports using her Albuterol inhaler once a day.  Associated fatigue.  Feels as though she is sleeping as much as she is awake if not more.  She shares that she has been treating and area of ringworm to her left thigh.  Has used prescription ketoconazole and tried OTC topical clotrimazole with benefit.  Feels the area has now healed.     Past Medical History:  Diagnosis Date  Anxiety    Asthma    C. difficile diarrhea    Depression    Gastritis    Gastropathy    mild reactive   Headache    IBS (irritable bowel syndrome)    Obesity    Vitamin D deficiency      Allergies  Allergen Reactions   Breo Ellipta [Fluticasone Furoate-Vilanterol] Other (See Comments)    Thrush   Peanut-Containing Drug Products Other (See Comments)    Tingling and numbness in throat from tree nuts   Prednisone Other (See Comments)    Elevated temperature   Penicillins Rash     Augmentin   Alcohol-Sulfur [Elemental Sulfur]    Benzyl Alcohol Other (See Comments)   Metronidazole     Other reaction(s): Other (See Comments) Seizures and high temp.   Toradol [Ketorolac Tromethamine]     Syncope, nausea    Current Outpatient Medications on File Prior to Visit  Medication Sig   Albuterol Sulfate 108 (90 Base) MCG/ACT AEPB Inhale into the lungs.   budesonide-formoterol (SYMBICORT) 160-4.5 MCG/ACT inhaler Use 2 Inhalations (15 minutes apart)  2 x/day (every 12 hours) (Patient not taking: Reported on 03/06/2023)   ketoconazole (NIZORAL) 2 % cream Apply 1 application topically daily. Apply to Ringworm rash 2 x /day til cleared (Patient not taking: Reported on 11/03/2021)   levalbuterol (XOPENEX HFA) 45 MCG/ACT inhaler INHALE 2 PUFFS INTO LUNGS EVERY 4 HOURS AS NEEDED FOR WHEEZING (Patient not taking: Reported on 03/06/2023)   No current facility-administered medications on file prior to visit.    ROS: all negative except what is noted in the HPI.   Physical Exam:  BP 128/74   Pulse 92   Temp 97.9 F (36.6 C)   Resp 16   Ht 5\' 3"  (1.6 m)   Wt 193 lb (87.5 kg)   SpO2 99%   BMI 34.19 kg/m   General Appearance: NAD.  Awake, conversant and cooperative. Eyes: PERRLA, EOMs intact.  Sclera white.  Conjunctiva without erythema. Sinuses: No frontal/maxillary tenderness.  No nasal discharge. Nares patent.  ENT/Mouth: Ext aud canals clear.  Bilateral TMs w/DOL and without erythema or bulging. Hearing intact.  Posterior pharynx without swelling or exudate.  Tonsils without swelling or erythema.  Neck: Supple.  No masses, nodules or thyromegaly. Respiratory: Effort is regular with non-labored breathing. Breath sounds are equal bilaterally without rales, rhonchi, wheezing or stridor.  Cardio: RRR with no MRGs. Brisk peripheral pulses without edema.  Abdomen: Active BS in all four quadrants.  Soft and non-tender without guarding, rebound tenderness, hernias or  masses. Lymphatics: Non tender without lymphadenopathy.  Musculoskeletal: Full ROM, 5/5 strength, normal ambulation.  No clubbing or cyanosis. Skin: Generalized bruising to BLE, light in color, circular.  One  most noticeably at medial popliteal left knee with small nodule felt underneath with tenderness to palpation otherwise skin is appropriate color for ethnicity. Warm without rashes, lesions, ecchymosis, ulcers.  Neuro: CN II-XII grossly normal. Normal muscle tone without cerebellar symptoms and intact sensation.   Psych: AO X 3,  appropriate mood and affect, insight and judgment.     [name], NP 3:06 PM [facility]

## 2023-03-07 ENCOUNTER — Encounter: Payer: Self-pay | Admitting: Nurse Practitioner

## 2023-03-07 LAB — COMPLETE METABOLIC PANEL WITH GFR
AG Ratio: 2 (calc) (ref 1.0–2.5)
ALT: 6 U/L (ref 6–29)
AST: 14 U/L (ref 10–30)
Albumin: 5.1 g/dL (ref 3.6–5.1)
Alkaline phosphatase (APISO): 82 U/L (ref 31–125)
BUN: 14 mg/dL (ref 7–25)
CO2: 22 mmol/L (ref 20–32)
Calcium: 10 mg/dL (ref 8.6–10.2)
Chloride: 107 mmol/L (ref 98–110)
Creat: 0.79 mg/dL (ref 0.50–0.96)
Globulin: 2.5 g/dL (ref 1.9–3.7)
Glucose, Bld: 89 mg/dL (ref 65–99)
Potassium: 3.9 mmol/L (ref 3.5–5.3)
Sodium: 141 mmol/L (ref 135–146)
Total Bilirubin: 0.5 mg/dL (ref 0.2–1.2)
Total Protein: 7.6 g/dL (ref 6.1–8.1)
eGFR: 104 mL/min/{1.73_m2} (ref >=60)

## 2023-03-07 LAB — LIPID PANEL
Cholesterol: 160 mg/dL (ref <200)
HDL: 62 mg/dL (ref >=50)
LDL Cholesterol (Calc): 82 mg/dL
Non-HDL Cholesterol (Calc): 98 mg/dL (ref <130)
Total CHOL/HDL Ratio: 2.6 (calc) (ref <5.0)
Triglycerides: 76 mg/dL (ref <150)

## 2023-03-07 LAB — TEST AUTHORIZATION: TEST CODE:: 17306

## 2023-03-07 LAB — IRON,TIBC AND FERRITIN PANEL
%SAT: 19 % (ref 16–45)
Ferritin: 54 ng/mL (ref 16–154)
Iron: 71 ug/dL (ref 40–190)
TIBC: 367 ug/dL (ref 250–450)

## 2023-03-07 LAB — CBC WITH DIFFERENTIAL/PLATELET
Absolute Monocytes: 773 {cells}/uL (ref 200–950)
Basophils Absolute: 82 {cells}/uL (ref 0–200)
Basophils Relative: 0.8 %
Eosinophils Absolute: 515 {cells}/uL — ABNORMAL HIGH (ref 15–500)
Eosinophils Relative: 5 %
HCT: 41.2 % (ref 35.0–45.0)
Hemoglobin: 14.2 g/dL (ref 11.7–15.5)
Lymphs Abs: 3306 cells/uL (ref 850–3900)
MCH: 31.6 pg (ref 27.0–33.0)
MCHC: 34.5 g/dL (ref 32.0–36.0)
MCV: 91.6 fL (ref 80.0–100.0)
MPV: 10.9 fL (ref 7.5–12.5)
Monocytes Relative: 7.5 %
Neutro Abs: 5624 {cells}/uL (ref 1500–7800)
Neutrophils Relative %: 54.6 %
Platelets: 327 10*3/uL (ref 140–400)
RBC: 4.5 10*6/uL (ref 3.80–5.10)
RDW: 12.3 % (ref 11.0–15.0)
Total Lymphocyte: 32.1 %
WBC: 10.3 10*3/uL (ref 3.8–10.8)

## 2023-03-07 LAB — HEMOGLOBIN A1C
Hgb A1c MFr Bld: 4.9 %{Hb} (ref <5.7)
Mean Plasma Glucose: 94 mg/dL
eAG (mmol/L): 5.2 mmol/L

## 2023-03-07 LAB — INSULIN, RANDOM: Insulin: 8.4 u[IU]/mL

## 2023-03-07 LAB — TSH: TSH: 2.42 m[IU]/L

## 2023-03-07 NOTE — Patient Instructions (Signed)

## 2023-03-08 ENCOUNTER — Other Ambulatory Visit: Payer: Self-pay | Admitting: Nurse Practitioner

## 2023-03-08 MED ORDER — BUDESONIDE-FORMOTEROL FUMARATE 160-4.5 MCG/ACT IN AERO: INHALATION_SPRAY | RESPIRATORY_TRACT | 3 refills | Status: AC

## 2023-03-15 ENCOUNTER — Other Ambulatory Visit: Payer: Self-pay | Admitting: Nurse Practitioner

## 2023-03-15 MED ORDER — FLUCONAZOLE 100 MG PO TABS: 100.0000 mg | ORAL_TABLET | Freq: Every day | ORAL | 0 refills | Status: AC

## 2023-03-16 ENCOUNTER — Other Ambulatory Visit: Payer: Self-pay | Admitting: Nurse Practitioner

## 2023-05-30 ENCOUNTER — Ambulatory Visit (INDEPENDENT_AMBULATORY_CARE_PROVIDER_SITE_OTHER): Payer: Self-pay | Admitting: Nurse Practitioner

## 2023-05-30 ENCOUNTER — Encounter: Payer: Self-pay | Admitting: Nurse Practitioner

## 2023-05-30 ENCOUNTER — Other Ambulatory Visit: Payer: Self-pay

## 2023-05-30 VITALS — BP 130/82 | HR 83 | Temp 97.9°F | Ht 63.0 in | Wt 190.6 lb

## 2023-05-30 DIAGNOSIS — J069 Acute upper respiratory infection, unspecified: Secondary | ICD-10-CM

## 2023-05-30 DIAGNOSIS — R0981 Nasal congestion: Secondary | ICD-10-CM

## 2023-05-30 DIAGNOSIS — R5081 Fever presenting with conditions classified elsewhere: Secondary | ICD-10-CM

## 2023-05-30 DIAGNOSIS — R051 Acute cough: Secondary | ICD-10-CM

## 2023-05-30 MED ORDER — PROMETHAZINE-DM 6.25-15 MG/5ML PO SYRP
5.0000 mL | ORAL_SOLUTION | Freq: Four times a day (QID) | ORAL | 0 refills | Status: AC | PRN
Start: 2023-05-30 — End: ?

## 2023-05-30 MED ORDER — AZITHROMYCIN 250 MG PO TABS
ORAL_TABLET | ORAL | 1 refills | Status: AC
Start: 2023-05-30 — End: ?

## 2023-05-30 NOTE — Patient Instructions (Signed)

## 2023-05-30 NOTE — Progress Notes (Signed)
Assessment and Plan:  Lori West was seen today for an episodic visit.  Diagnoses and all order for this visit:  Upper respiratory tract infection, unspecified type Start tmt with Zpak Continue nebulizer treatment - duoneb sample provided. Continue Symbicort, Levalbuterol Start Decardron steroid taper  (on hand) Chest x-ray to assess for any underlying etiology Report to ER for any increase in difficulty breathing  - azithromycin (ZITHROMAX) 250 MG tablet; Take 2 tablets on  Day 1,  followed by 1 tablet  daily for 4 more days    for Sinusitis  /Bronchitis  Dispense: 6 each; Refill: 1  - CXR  Acute cough/Nasal congestion Start Promethazine cough syrup as directed Recommend daily antihistamine to help with overall congestion  Stay well hydrated to keep mucus thin and productive.  - promethazine-dextromethorphan (PROMETHAZINE-DM) 6.25-15 MG/5ML syrup; Take 5 mLs by mouth 4 (four) times daily as needed for cough.  Dispense: 240 mL; Refill: 0  Fever in other diseases Continue Tylenol as directed as needed Monitor for increase in fever or no response to medications   Notify office for further evaluation and treatment, questions or concerns if s/s fail to improve. The risks and benefits of my recommendations, as well as other treatment options were discussed with the patient today. Questions were answered.  Further disposition pending results of labs. Discussed med's effects and SE's.    Over 20 minutes of exam, counseling, chart review, and critical decision making was performed.   No future appointments.  ------------------------------------------------------------------------------------------------------------------   HPI BP 130/82   Pulse 83   Temp 97.9 F (36.6 C)   Ht 5\' 3"  (1.6 m)   Wt 190 lb 9.6 oz (86.5 kg)   SpO2 97%   BMI 33.76 kg/m    Patient complains of symptoms of a URI. Symptoms include congestion, cough described as productive, fever 102.4,  headache described as throbbing, nasal congestion, post nasal drip, shortness of breath, sinus pressure, sneezing, sore throat, and wheezing. Onset of symptoms was 1 week ago, and has been unchanged since that time. Treatment to date: antihistamines, cough suppressants, and decongestants.  Reports exposure to family member over the holiday that was sick.  Past Medical History:  Diagnosis Date   Anxiety    Asthma    C. difficile diarrhea    Depression    Gastritis    Gastropathy    mild reactive   Headache    IBS (irritable bowel syndrome)    Obesity    Vitamin D deficiency      Allergies  Allergen Reactions   Breo Ellipta [Fluticasone Furoate-Vilanterol] Other (See Comments)    Thrush   Peanut-Containing Drug Products Other (See Comments)    Tingling and numbness in throat from tree nuts   Prednisone Other (See Comments)    Elevated temperature   Penicillins Rash    Augmentin   Alcohol-Sulfur [Elemental Sulfur]    Benzyl Alcohol Other (See Comments)   Metronidazole     Other reaction(s): Other (See Comments) Seizures and high temp.   Toradol [Ketorolac Tromethamine]     Syncope, nausea    Current Outpatient Medications on File Prior to Visit  Medication Sig   Albuterol Sulfate 108 (90 Base) MCG/ACT AEPB Inhale into the lungs.   budesonide-formoterol (SYMBICORT) 160-4.5 MCG/ACT inhaler Use 2 Inhalations (15 minutes apart)  2 x/day (every 12 hours)   ketoconazole (NIZORAL) 2 % cream Apply 1 application topically daily. Apply to Ringworm rash 2 x /day til cleared   levalbuterol [facility]  HFA) 45 MCG/ACT inhaler INHALE 2 PUFFS INTO LUNGS EVERY 4 HOURS AS NEEDED FOR WHEEZING   No current facility-administered medications on file prior to visit.    ROS: all negative except what is noted in the HPI.   Physical Exam:  BP 130/82   Pulse 83   Temp 97.9 F (36.6 C)   Ht 5\' 3"  (1.6 m)   Wt 190 lb 9.6 oz (86.5 kg)   SpO2 97%   BMI 33.76 kg/m   General Appearance: NAD.   Awake, conversant and cooperative. Eyes: PERRLA, EOMs intact.  Sclera white.  Conjunctiva without erythema. Sinuses: Frontal/maxillary tenderness.  No nasal discharge. Nares patent.  ENT/Mouth: Ext aud canals clear.  Bilateral TMs w/DOL and without erythema or bulging. Hearing intact.  Posterior pharynx without swelling or exudate.  Tonsils without swelling or erythema.  Neck: Supple.  No masses, nodules or thyromegaly. Respiratory: Effort is regular with non-labored breathing. Breath sounds are equal bilaterally without rales, rhonchi, wheezing or stridor.  Cardio: RRR with no MRGs. Brisk peripheral pulses without edema.  Abdomen: Active BS in all four quadrants.  Soft and non-tender without guarding, rebound tenderness, hernias or masses. Lymphatics: Non tender without lymphadenopathy.  Musculoskeletal: Full ROM, 5/5 strength, normal ambulation.  No clubbing or cyanosis. Skin: Appropriate color for ethnicity. Warm without rashes, lesions, ecchymosis, ulcers.  Neuro: CN II-XII grossly normal. Normal muscle tone without cerebellar symptoms and intact sensation.   Psych: AO X 3,  appropriate mood and affect, insight and judgment.     [name], NP 3:20 PM [facility]
